# Patient Record
Sex: Female | Born: 1935 | Race: White | Hispanic: No | Marital: Single | Smoking: Former smoker
Health system: Southern US, Community
[De-identification: ages and names within clinical notes are randomized; demographics above are authoritative.]

## PROBLEM LIST (undated history)

## (undated) DIAGNOSIS — C801 Malignant (primary) neoplasm, unspecified: Secondary | ICD-10-CM

## (undated) DIAGNOSIS — L570 Actinic keratosis: Secondary | ICD-10-CM

## (undated) DIAGNOSIS — C50419 Malignant neoplasm of upper-outer quadrant of unspecified female breast: Secondary | ICD-10-CM

## (undated) DIAGNOSIS — S32592A Other specified fracture of left pubis, initial encounter for closed fracture: Secondary | ICD-10-CM

## (undated) DIAGNOSIS — IMO0002 Reserved for concepts with insufficient information to code with codable children: Secondary | ICD-10-CM

## (undated) DIAGNOSIS — I10 Essential (primary) hypertension: Secondary | ICD-10-CM

## (undated) DIAGNOSIS — R350 Frequency of micturition: Secondary | ICD-10-CM

## (undated) DIAGNOSIS — T8859XA Other complications of anesthesia, initial encounter: Secondary | ICD-10-CM

## (undated) DIAGNOSIS — M1990 Unspecified osteoarthritis, unspecified site: Secondary | ICD-10-CM

## (undated) DIAGNOSIS — C50919 Malignant neoplasm of unspecified site of unspecified female breast: Secondary | ICD-10-CM

## (undated) DIAGNOSIS — K5903 Drug induced constipation: Secondary | ICD-10-CM

## (undated) DIAGNOSIS — J3089 Other allergic rhinitis: Secondary | ICD-10-CM

## (undated) DIAGNOSIS — T4145XA Adverse effect of unspecified anesthetic, initial encounter: Secondary | ICD-10-CM

## (undated) HISTORY — DX: Actinic keratosis: L57.0

## (undated) HISTORY — PX: BREAST BIOPSY: SHX20

## (undated) HISTORY — PX: FRACTURE SURGERY: SHX138

## (undated) HISTORY — PX: EYE SURGERY: SHX253

## (undated) HISTORY — DX: Malignant (primary) neoplasm, unspecified: C80.1

## (undated) HISTORY — DX: Malignant neoplasm of upper-outer quadrant of unspecified female breast: C50.419

## (undated) HISTORY — PX: OTHER SURGICAL HISTORY: SHX169

## (undated) HISTORY — PX: BACK SURGERY: SHX140

## (undated) HISTORY — PX: JOINT REPLACEMENT: SHX530

## (undated) HISTORY — PX: BREAST LUMPECTOMY: SHX2

## (undated) HISTORY — DX: Other specified fracture of left pubis, initial encounter for closed fracture: S32.592A

## (undated) HISTORY — PX: BREAST SURGERY: SHX581

## (undated) HISTORY — PX: WRIST SURGERY: SHX841

---

## 1993-11-07 DIAGNOSIS — C50419 Malignant neoplasm of upper-outer quadrant of unspecified female breast: Secondary | ICD-10-CM

## 1993-11-07 HISTORY — DX: Malignant neoplasm of upper-outer quadrant of unspecified female breast: C50.419

## 2000-11-07 HISTORY — PX: BREAST LUMPECTOMY: SHX2

## 2004-09-01 ENCOUNTER — Ambulatory Visit: Payer: Self-pay | Admitting: Unknown Physician Specialty

## 2004-10-15 ENCOUNTER — Ambulatory Visit: Payer: Self-pay | Admitting: Unknown Physician Specialty

## 2004-11-07 HISTORY — PX: COLONOSCOPY: SHX174

## 2005-01-03 ENCOUNTER — Ambulatory Visit: Payer: Self-pay | Admitting: Radiation Oncology

## 2005-06-16 ENCOUNTER — Ambulatory Visit: Payer: Self-pay | Admitting: General Surgery

## 2006-06-28 ENCOUNTER — Ambulatory Visit: Payer: Self-pay | Admitting: General Surgery

## 2006-07-05 ENCOUNTER — Emergency Department: Payer: Self-pay | Admitting: Emergency Medicine

## 2007-07-30 ENCOUNTER — Ambulatory Visit: Payer: Self-pay | Admitting: General Surgery

## 2007-11-23 DIAGNOSIS — C4492 Squamous cell carcinoma of skin, unspecified: Secondary | ICD-10-CM

## 2007-11-23 HISTORY — DX: Squamous cell carcinoma of skin, unspecified: C44.92

## 2008-08-11 ENCOUNTER — Ambulatory Visit: Payer: Self-pay | Admitting: General Surgery

## 2008-12-16 ENCOUNTER — Ambulatory Visit: Payer: Self-pay | Admitting: Urology

## 2009-03-05 ENCOUNTER — Ambulatory Visit: Payer: Self-pay | Admitting: Unknown Physician Specialty

## 2009-03-12 ENCOUNTER — Ambulatory Visit: Payer: Self-pay | Admitting: Unknown Physician Specialty

## 2009-04-13 ENCOUNTER — Emergency Department: Payer: Self-pay | Admitting: Unknown Physician Specialty

## 2009-08-17 ENCOUNTER — Ambulatory Visit: Payer: Self-pay | Admitting: General Surgery

## 2009-11-07 HISTORY — PX: BREAST LUMPECTOMY: SHX2

## 2010-08-19 DIAGNOSIS — Z85828 Personal history of other malignant neoplasm of skin: Secondary | ICD-10-CM

## 2010-08-19 HISTORY — DX: Personal history of other malignant neoplasm of skin: Z85.828

## 2010-09-21 ENCOUNTER — Ambulatory Visit: Payer: Self-pay | Admitting: Unknown Physician Specialty

## 2010-09-28 ENCOUNTER — Ambulatory Visit: Payer: Self-pay | Admitting: Unknown Physician Specialty

## 2010-11-10 ENCOUNTER — Ambulatory Visit: Payer: Self-pay | Admitting: General Surgery

## 2010-11-23 ENCOUNTER — Ambulatory Visit: Payer: Self-pay | Admitting: Anesthesiology

## 2010-11-24 ENCOUNTER — Ambulatory Visit: Payer: Self-pay | Admitting: General Surgery

## 2010-11-27 LAB — PATHOLOGY REPORT

## 2011-04-19 ENCOUNTER — Ambulatory Visit: Payer: Self-pay | Admitting: General Surgery

## 2011-10-20 ENCOUNTER — Ambulatory Visit: Payer: Self-pay | Admitting: General Surgery

## 2012-05-02 ENCOUNTER — Ambulatory Visit: Payer: Self-pay | Admitting: General Surgery

## 2012-07-23 ENCOUNTER — Encounter (HOSPITAL_COMMUNITY): Payer: Self-pay

## 2012-07-23 ENCOUNTER — Other Ambulatory Visit: Payer: Self-pay | Admitting: Orthopedic Surgery

## 2012-07-25 ENCOUNTER — Encounter (HOSPITAL_COMMUNITY): Payer: Self-pay

## 2012-07-25 ENCOUNTER — Encounter (HOSPITAL_COMMUNITY)
Admission: RE | Admit: 2012-07-25 | Discharge: 2012-07-25 | Disposition: A | Discharge status: Home / Self Care | Payer: Medicare Other | Source: Ambulatory Visit | Attending: Orthopedic Surgery | Admitting: Orthopedic Surgery

## 2012-07-25 HISTORY — DX: Unspecified osteoarthritis, unspecified site: M19.90

## 2012-07-25 LAB — PROTIME-INR
INR: 1.01 (ref 0.00–1.49)
Prothrombin Time: 13.2 seconds (ref 11.6–15.2)

## 2012-07-25 LAB — BASIC METABOLIC PANEL
CO2: 26 mEq/L (ref 19–32)
Calcium: 10 mg/dL (ref 8.4–10.5)
GFR calc Af Amer: >90 mL/min (ref >90)
GFR calc non Af Amer: 81 mL/min — ABNORMAL LOW (ref >90)
Sodium: 138 mEq/L (ref 135–145)

## 2012-07-25 LAB — CBC WITH DIFFERENTIAL/PLATELET
Basophils Absolute: 0 10*3/uL (ref 0.0–0.1)
Lymphocytes Relative: 30 % (ref 12–46)
Lymphs Abs: 2.7 10*3/uL (ref 0.7–4.0)
Neutro Abs: 5.5 10*3/uL (ref 1.7–7.7)
Neutrophils Relative %: 59 % (ref 43–77)
Platelets: 252 10*3/uL (ref 150–400)
RBC: 4.37 MIL/uL (ref 3.87–5.11)
RDW: 13 % (ref 11.5–15.5)
WBC: 9.2 10*3/uL (ref 4.0–10.5)

## 2012-07-25 LAB — URINALYSIS, ROUTINE W REFLEX MICROSCOPIC
Bilirubin Urine: NEGATIVE
Hgb urine dipstick: NEGATIVE
Ketones, ur: NEGATIVE mg/dL
Nitrite: NEGATIVE
Urobilinogen, UA: 1 mg/dL (ref 0.0–1.0)
pH: 5.5 (ref 5.0–8.0)

## 2012-07-25 LAB — TYPE AND SCREEN: Antibody Screen: NEGATIVE

## 2012-07-25 LAB — APTT: aPTT: 29 seconds (ref 24–37)

## 2012-07-25 NOTE — Pre-Procedure Instructions (Addendum)
20 Lori Wallace  07/25/2012   Your procedure is scheduled on:  Monday September 23  Report to [facility] at 10:30 AM.  Call this number if you have problems the morning of surgery: [phone]   Remember:   Do not eat or drink:After Midnight.    Take these medicines the morning of surgery with A SIP OF WATER: May take hydrocodone or tramadol pain pill if needed.    Do not wear jewelry, make-up or nail polish.  Do not wear lotions, powders, or perfumes. You may wear deodorant.  Do not shave 48 hours prior to surgery. Men may shave face and neck.  Do not bring valuables to the hospital.  Contacts, dentures or bridgework may not be worn into surgery.  Leave suitcase in the car. After surgery it may be brought to your room.  For patients admitted to the hospital, checkout time is 11:00 AM the day of discharge.   Patients discharged the day of surgery will not be allowed to drive home.  Name and phone number of your driver: NA  Special Instructions: Incentive Spirometry - Practice and bring it with you on the day of surgery. and CHG Shower Use Special Wash: 1/2 bottle night before surgery and 1/2 bottle morning of surgery.   Please read over the following fact sheets that you were given: Pain Booklet, Coughing and Deep Breathing, Blood Transfusion Information, Total Joint Packet and Surgical Site Infection Prevention

## 2012-07-25 NOTE — Progress Notes (Addendum)
Requested EKG from Dr. Silver Huguenin at [facility] in [place].  Requested old CXR from [place]. Will leave chart for anesthesia review of CXR done today.

## 2012-07-26 ENCOUNTER — Encounter (HOSPITAL_COMMUNITY): Payer: Self-pay | Admitting: Vascular Surgery

## 2012-07-26 NOTE — Consult Note (Signed)
Anesthesia Chart Review:  Patient is a 76 year old female scheduled for left THA on 07/30/12 by Dr. Turner Daniels.  History includes former smoker, breast CA s/p bilateral lumpectomies and radiation, arthritis, prior joint, back, and eye surgeries.   CXR from 07/25/12 showed: Mild hyperinflation. Streaky atelectasis lung bases. No acute  infiltrate or pulmonary edema. Surgical clips are noted in the  left axilla. Osteopenia and mild degenerative changes mid thoracic spine.   Labs noted.    EKG from PCP Dr. Evie Lacks office from 01/24/12 showed NSR, possible LAE.  Her EKG was felt stable according to handwritten comments.    Anticipate she can proceed as planned.  Shonna Chock, PA-C

## 2012-07-27 NOTE — H&P (Signed)
  HPI: Patient presents with a chief complaint of left hip pain that began 3 or 4 years ago with no known mechanism of injury.  She was initially diagnosed with trochanteric bursitis and a cortisone shot provided her with approximately 6 weeks of pain relief.  She has also tried Aleve and acupuncture.  She recently saw a chiropractor who wanted her to have updated x-rays before any type of adjustment.  She was seen by Dr. Charlett Blake who ordered x-rays of the hip and told her that she had end-stage arthritis.  She is ambulating with a cane and reports that her pain seems to be progressively worsening.  She localizes most of it to the groin area.  All: Demerol  ROS: 14 point review of systems form filled out by the patient was reviewed and was negative as it relates to the history of present illness except for: Glasses, balance problems, easy bruising  PMH: Breast cancer.  She is status post right total knee arthroplasty and back surgery  FHx: Hypertension and heart disease  SocHx: Denies use of tobacco or alcohol.  She is retired and takes care of her 78 year old mother  PHYSICAL EXAM: Awake, alert, and oriented x3.  Extraocular motion is intact.  No use of accessory respiratory muscles for breathing.   Cardiovascular exam reveals a regular rhythm.  Skin is intact without cuts, scrapes, or abrasions. Well-developed, well-nourished 76 year old female who is alert and oriented and in no acute distress.  She is 5 feet 11 inches tall and weighs 173 pounds.  BMI is 24.  Exam of the left hip demonstrates significant pain with any attempt at internal rotation.  Strength in the lower extremities is intact and symmetric.  Imaging/Tests: X-rays of the left hip taken by Dr. Charlett Blake were reviewed in our office today and demonstrate end-stage arthritis in the superior weightbearing dome of the left hip with collapse of the femoral head  Asses: End-stage arthritis of the left hip  Plan: We have discussed hip  replacement surgery in detail with [name] today.  She understands all risks and benefits and would like to proceed.  She will talk to Agustin Cree about scheduling.

## 2012-07-29 MED ORDER — DEXTROSE-NACL 5-0.45 % IV SOLN: INTRAVENOUS | Status: DC

## 2012-07-29 MED ORDER — CHLORHEXIDINE GLUCONATE 4 % EX LIQD: 60.0000 mL | Freq: Once | CUTANEOUS | Status: DC

## 2012-07-29 MED ORDER — CEFAZOLIN SODIUM-DEXTROSE 2-3 GM-% IV SOLR
2.0000 g | INTRAVENOUS | Status: AC
  Administered 2012-07-30: 2 g via INTRAVENOUS
  Filled 2012-07-29: qty 50

## 2012-07-30 ENCOUNTER — Encounter (HOSPITAL_COMMUNITY): Payer: Self-pay | Admitting: Vascular Surgery

## 2012-07-30 ENCOUNTER — Encounter (HOSPITAL_COMMUNITY): Payer: Self-pay | Admitting: *Deleted

## 2012-07-30 ENCOUNTER — Inpatient Hospital Stay (HOSPITAL_COMMUNITY): Payer: Medicare Other

## 2012-07-30 ENCOUNTER — Encounter (HOSPITAL_COMMUNITY)
Admission: RE | Disposition: A | Discharge status: Home Health | Payer: Self-pay | Source: Ambulatory Visit | Attending: Orthopedic Surgery

## 2012-07-30 ENCOUNTER — Inpatient Hospital Stay (HOSPITAL_COMMUNITY)
Admission: RE | Admit: 2012-07-30 | Discharge: 2012-08-01 | DRG: 470 | Disposition: A | Discharge status: Home Health | Payer: Medicare Other | Source: Ambulatory Visit | Attending: Orthopedic Surgery | Admitting: Orthopedic Surgery

## 2012-07-30 ENCOUNTER — Inpatient Hospital Stay (HOSPITAL_COMMUNITY): Payer: Medicare Other | Admitting: Vascular Surgery

## 2012-07-30 DIAGNOSIS — Z96659 Presence of unspecified artificial knee joint: Secondary | ICD-10-CM

## 2012-07-30 DIAGNOSIS — Z7982 Long term (current) use of aspirin: Secondary | ICD-10-CM

## 2012-07-30 DIAGNOSIS — Z853 Personal history of malignant neoplasm of breast: Secondary | ICD-10-CM

## 2012-07-30 DIAGNOSIS — Z79899 Other long term (current) drug therapy: Secondary | ICD-10-CM

## 2012-07-30 DIAGNOSIS — D62 Acute posthemorrhagic anemia: Secondary | ICD-10-CM | POA: Diagnosis not present

## 2012-07-30 DIAGNOSIS — Z01812 Encounter for preprocedural laboratory examination: Secondary | ICD-10-CM

## 2012-07-30 DIAGNOSIS — M1612 Unilateral primary osteoarthritis, left hip: Secondary | ICD-10-CM | POA: Diagnosis present

## 2012-07-30 DIAGNOSIS — M1610 Unilateral primary osteoarthritis, unspecified hip: Principal | ICD-10-CM | POA: Diagnosis present

## 2012-07-30 DIAGNOSIS — M169 Osteoarthritis of hip, unspecified: Principal | ICD-10-CM | POA: Diagnosis present

## 2012-07-30 DIAGNOSIS — Z923 Personal history of irradiation: Secondary | ICD-10-CM

## 2012-07-30 DIAGNOSIS — Z8249 Family history of ischemic heart disease and other diseases of the circulatory system: Secondary | ICD-10-CM

## 2012-07-30 HISTORY — PX: TOTAL HIP ARTHROPLASTY: SHX124

## 2012-07-30 SURGERY — ARTHROPLASTY, HIP, TOTAL,POSTERIOR APPROACH
Anesthesia: General | Site: Hip | Laterality: Left | Wound class: Clean

## 2012-07-30 MED ORDER — HYDROMORPHONE HCL PF 1 MG/ML IJ SOLN
INTRAMUSCULAR | Status: AC
  Filled 2012-07-30: qty 1

## 2012-07-30 MED ORDER — MORPHINE SULFATE 10 MG/ML IJ SOLN
INTRAMUSCULAR | Status: DC | PRN
  Administered 2012-07-30 (×2): 5 mg via INTRAVENOUS

## 2012-07-30 MED ORDER — KETOROLAC TROMETHAMINE 15 MG/ML IJ SOLN
7.5000 mg | Freq: Four times a day (QID) | INTRAMUSCULAR | Status: AC
  Administered 2012-07-30: 7.5 mg via INTRAVENOUS
  Filled 2012-07-30 (×4): qty 1

## 2012-07-30 MED ORDER — LACTATED RINGERS IV SOLN
INTRAVENOUS | Status: DC
  Administered 2012-07-30 (×3): via INTRAVENOUS

## 2012-07-30 MED ORDER — ASPIRIN EC 325 MG PO TBEC
325.0000 mg | DELAYED_RELEASE_TABLET | Freq: Two times a day (BID) | ORAL | Status: DC
  Administered 2012-07-30 – 2012-08-01 (×4): 325 mg via ORAL
  Filled 2012-07-30 (×5): qty 1

## 2012-07-30 MED ORDER — OXYCODONE HCL 5 MG PO TABS
5.0000 mg | ORAL_TABLET | ORAL | Status: DC | PRN
  Administered 2012-07-31 – 2012-08-01 (×3): 5 mg via ORAL
  Filled 2012-07-30: qty 1
  Filled 2012-07-30: qty 2
  Filled 2012-07-30: qty 1

## 2012-07-30 MED ORDER — PHENYLEPHRINE HCL 10 MG/ML IJ SOLN
INTRAMUSCULAR | Status: DC | PRN
  Administered 2012-07-30: 40 ug via INTRAVENOUS
  Administered 2012-07-30: 80 ug via INTRAVENOUS
  Administered 2012-07-30: 40 ug via INTRAVENOUS

## 2012-07-30 MED ORDER — METOCLOPRAMIDE HCL 5 MG PO TABS
5.0000 mg | ORAL_TABLET | Freq: Three times a day (TID) | ORAL | Status: DC | PRN
  Filled 2012-07-30: qty 2

## 2012-07-30 MED ORDER — MIDAZOLAM HCL 5 MG/5ML IJ SOLN
INTRAMUSCULAR | Status: DC | PRN
  Administered 2012-07-30: 1 mg via INTRAVENOUS

## 2012-07-30 MED ORDER — PHENOL 1.4 % MT LIQD: 1.0000 | OROMUCOSAL | Status: DC | PRN

## 2012-07-30 MED ORDER — HYDROMORPHONE HCL PF 1 MG/ML IJ SOLN: 1.0000 mg | INTRAMUSCULAR | Status: DC | PRN

## 2012-07-30 MED ORDER — CELECOXIB 200 MG PO CAPS
200.0000 mg | ORAL_CAPSULE | Freq: Two times a day (BID) | ORAL | Status: DC
  Administered 2012-07-30 – 2012-08-01 (×4): 200 mg via ORAL
  Filled 2012-07-30 (×6): qty 1

## 2012-07-30 MED ORDER — ONDANSETRON HCL 4 MG/2ML IJ SOLN
INTRAMUSCULAR | Status: DC | PRN
  Administered 2012-07-30: 4 mg via INTRAVENOUS

## 2012-07-30 MED ORDER — FENTANYL CITRATE 0.05 MG/ML IJ SOLN
INTRAMUSCULAR | Status: DC | PRN
  Administered 2012-07-30 (×2): 100 ug via INTRAVENOUS
  Administered 2012-07-30 (×2): 50 ug via INTRAVENOUS

## 2012-07-30 MED ORDER — KETOROLAC TROMETHAMINE 30 MG/ML IJ SOLN
INTRAMUSCULAR | Status: AC
  Administered 2012-07-30: 7.5 mg
  Filled 2012-07-30: qty 1

## 2012-07-30 MED ORDER — BUPIVACAINE-EPINEPHRINE PF 0.25-1:200000 % IJ SOLN
INTRAMUSCULAR | Status: DC | PRN
  Administered 2012-07-30: 22 mL

## 2012-07-30 MED ORDER — ONDANSETRON HCL 4 MG PO TABS: 4.0000 mg | ORAL_TABLET | Freq: Four times a day (QID) | ORAL | Status: DC | PRN

## 2012-07-30 MED ORDER — ONDANSETRON HCL 4 MG/2ML IJ SOLN: 4.0000 mg | Freq: Once | INTRAMUSCULAR | Status: DC | PRN

## 2012-07-30 MED ORDER — SODIUM CHLORIDE 0.9 % IR SOLN
Status: DC | PRN
  Administered 2012-07-30: 1000 mL

## 2012-07-30 MED ORDER — MAGNESIUM HYDROXIDE 400 MG/5ML PO SUSP: 30.0000 mL | Freq: Every day | ORAL | Status: DC | PRN

## 2012-07-30 MED ORDER — OXYCODONE HCL 5 MG/5ML PO SOLN: 5.0000 mg | Freq: Once | ORAL | Status: DC | PRN

## 2012-07-30 MED ORDER — PROPOFOL 10 MG/ML IV EMUL
INTRAVENOUS | Status: DC | PRN
  Administered 2012-07-30: 150 mg via INTRAVENOUS
  Administered 2012-07-30: 10 mg via INTRAVENOUS

## 2012-07-30 MED ORDER — ACETAMINOPHEN 650 MG RE SUPP: 650.0000 mg | Freq: Four times a day (QID) | RECTAL | Status: DC | PRN

## 2012-07-30 MED ORDER — GLYCOPYRROLATE 0.2 MG/ML IJ SOLN
INTRAMUSCULAR | Status: DC | PRN
  Administered 2012-07-30: 0.6 mg via INTRAVENOUS
  Administered 2012-07-30: 0.1 mg via INTRAVENOUS

## 2012-07-30 MED ORDER — ONDANSETRON HCL 4 MG/2ML IJ SOLN
INTRAMUSCULAR | Status: AC
  Filled 2012-07-30: qty 2

## 2012-07-30 MED ORDER — KCL IN DEXTROSE-NACL 20-5-0.45 MEQ/L-%-% IV SOLN
INTRAVENOUS | Status: DC
  Administered 2012-07-31: 03:00:00 via INTRAVENOUS
  Filled 2012-07-30 (×9): qty 1000

## 2012-07-30 MED ORDER — FLEET ENEMA 7-19 GM/118ML RE ENEM: 1.0000 | ENEMA | Freq: Once | RECTAL | Status: AC | PRN

## 2012-07-30 MED ORDER — ALUM & MAG HYDROXIDE-SIMETH 200-200-20 MG/5ML PO SUSP: 30.0000 mL | ORAL | Status: DC | PRN

## 2012-07-30 MED ORDER — NEOSTIGMINE METHYLSULFATE 1 MG/ML IJ SOLN
INTRAMUSCULAR | Status: DC | PRN
  Administered 2012-07-30: 4 mg via INTRAVENOUS

## 2012-07-30 MED ORDER — ONDANSETRON HCL 4 MG/2ML IJ SOLN
4.0000 mg | Freq: Four times a day (QID) | INTRAMUSCULAR | Status: DC | PRN
  Administered 2012-07-30: 4 mg via INTRAVENOUS

## 2012-07-30 MED ORDER — ROCURONIUM BROMIDE 100 MG/10ML IV SOLN
INTRAVENOUS | Status: DC | PRN
  Administered 2012-07-30: 50 mg via INTRAVENOUS

## 2012-07-30 MED ORDER — ACETAMINOPHEN 325 MG PO TABS: 650.0000 mg | ORAL_TABLET | Freq: Four times a day (QID) | ORAL | Status: DC | PRN

## 2012-07-30 MED ORDER — BUPIVACAINE-EPINEPHRINE PF 0.25-1:200000 % IJ SOLN
INTRAMUSCULAR | Status: AC
  Filled 2012-07-30: qty 30

## 2012-07-30 MED ORDER — DIPHENHYDRAMINE HCL 12.5 MG/5ML PO ELIX: 12.5000 mg | ORAL_SOLUTION | ORAL | Status: DC | PRN

## 2012-07-30 MED ORDER — LIDOCAINE HCL (CARDIAC) 20 MG/ML IV SOLN
INTRAVENOUS | Status: DC | PRN
  Administered 2012-07-30: 50 mg via INTRAVENOUS

## 2012-07-30 MED ORDER — OXYCODONE HCL 5 MG PO TABS: 5.0000 mg | ORAL_TABLET | Freq: Once | ORAL | Status: DC | PRN

## 2012-07-30 MED ORDER — METOCLOPRAMIDE HCL 5 MG/ML IJ SOLN
5.0000 mg | Freq: Three times a day (TID) | INTRAMUSCULAR | Status: DC | PRN
  Administered 2012-07-30: 10 mg via INTRAVENOUS
  Filled 2012-07-30: qty 2

## 2012-07-30 MED ORDER — METHOCARBAMOL 500 MG PO TABS
500.0000 mg | ORAL_TABLET | Freq: Four times a day (QID) | ORAL | Status: DC | PRN
  Administered 2012-07-31: 500 mg via ORAL
  Filled 2012-07-30: qty 1

## 2012-07-30 MED ORDER — METHOCARBAMOL 100 MG/ML IJ SOLN
500.0000 mg | Freq: Four times a day (QID) | INTRAVENOUS | Status: DC | PRN
  Filled 2012-07-30: qty 5

## 2012-07-30 MED ORDER — FENTANYL CITRATE 0.05 MG/ML IJ SOLN
INTRAMUSCULAR | Status: AC
  Filled 2012-07-30: qty 2

## 2012-07-30 MED ORDER — BISACODYL 5 MG PO TBEC: 5.0000 mg | DELAYED_RELEASE_TABLET | Freq: Every day | ORAL | Status: DC | PRN

## 2012-07-30 MED ORDER — VITAMIN B-1 50 MG PO TABS
50.0000 mg | ORAL_TABLET | ORAL | Status: DC
  Administered 2012-08-01: 50 mg via ORAL
  Filled 2012-07-30 (×3): qty 1

## 2012-07-30 MED ORDER — HYDROMORPHONE HCL PF 1 MG/ML IJ SOLN
0.2500 mg | INTRAMUSCULAR | Status: DC | PRN
  Administered 2012-07-30: 0.5 mg via INTRAVENOUS

## 2012-07-30 MED ORDER — MENTHOL 3 MG MT LOZG: 1.0000 | LOZENGE | OROMUCOSAL | Status: DC | PRN

## 2012-07-30 SURGICAL SUPPLY — 53 items
BLADE SAW SAG 73X25 THK (BLADE) ×1
BLADE SAW SGTL 18X1.27X75 (BLADE) IMPLANT
BLADE SAW SGTL 73X25 THK (BLADE) ×1 IMPLANT
BLADE SAW SGTL MED 73X18.5 STR (BLADE) IMPLANT
BRUSH FEMORAL CANAL (MISCELLANEOUS) IMPLANT
CLOTH BEACON ORANGE TIMEOUT ST (SAFETY) ×2 IMPLANT
COVER BACK TABLE 24X17X13 BIG (DRAPES) IMPLANT
COVER SURGICAL LIGHT HANDLE (MISCELLANEOUS) ×4 IMPLANT
DRAPE ORTHO SPLIT 77X108 STRL (DRAPES) ×1
DRAPE PROXIMA HALF (DRAPES) ×2 IMPLANT
DRAPE SURG ORHT 6 SPLT 77X108 (DRAPES) ×1 IMPLANT
DRAPE U-SHAPE 47X51 STRL (DRAPES) ×2 IMPLANT
DRILL BIT 7/64X5 (BIT) ×2 IMPLANT
DRSG MEPILEX BORDER 4X12 (GAUZE/BANDAGES/DRESSINGS) ×2 IMPLANT
DRSG MEPILEX BORDER 4X8 (GAUZE/BANDAGES/DRESSINGS) ×2 IMPLANT
DURAPREP 26ML APPLICATOR (WOUND CARE) ×2 IMPLANT
ELECT BLADE 4.0 EZ CLEAN MEGAD (MISCELLANEOUS)
ELECT REM PT RETURN 9FT ADLT (ELECTROSURGICAL) ×2
ELECTRODE BLDE 4.0 EZ CLN MEGD (MISCELLANEOUS) IMPLANT
ELECTRODE REM PT RTRN 9FT ADLT (ELECTROSURGICAL) ×1 IMPLANT
GAUZE XEROFORM 1X8 LF (GAUZE/BANDAGES/DRESSINGS) ×2 IMPLANT
GLOVE BIO SURGEON STRL SZ7 (GLOVE) ×2 IMPLANT
GLOVE BIO SURGEON STRL SZ7.5 (GLOVE) ×2 IMPLANT
GLOVE BIOGEL PI IND STRL 7.0 (GLOVE) ×1 IMPLANT
GLOVE BIOGEL PI IND STRL 8 (GLOVE) ×1 IMPLANT
GLOVE BIOGEL PI INDICATOR 7.0 (GLOVE) ×1
GLOVE BIOGEL PI INDICATOR 8 (GLOVE) ×1
GOWN PREVENTION PLUS XLARGE (GOWN DISPOSABLE) ×2 IMPLANT
GOWN STRL NON-REIN LRG LVL3 (GOWN DISPOSABLE) ×4 IMPLANT
HANDPIECE INTERPULSE COAX TIP (DISPOSABLE)
HOOD PEEL AWAY FACE SHEILD DIS (HOOD) ×4 IMPLANT
KIT BASIN OR (CUSTOM PROCEDURE TRAY) ×2 IMPLANT
KIT ROOM TURNOVER OR (KITS) ×2 IMPLANT
MANIFOLD NEPTUNE II (INSTRUMENTS) ×2 IMPLANT
NEEDLE 22X1 1/2 (OR ONLY) (NEEDLE) ×2 IMPLANT
NS IRRIG 1000ML POUR BTL (IV SOLUTION) ×2 IMPLANT
PACK TOTAL JOINT (CUSTOM PROCEDURE TRAY) ×2 IMPLANT
PAD ARMBOARD 7.5X6 YLW CONV (MISCELLANEOUS) ×4 IMPLANT
PASSER SUT SWANSON 36MM LOOP (INSTRUMENTS) ×2 IMPLANT
PRESSURIZER FEMORAL UNIV (MISCELLANEOUS) IMPLANT
SET HNDPC FAN SPRY TIP SCT (DISPOSABLE) IMPLANT
SUT ETHIBOND 2 V 37 (SUTURE) ×2 IMPLANT
SUT ETHILON 3 0 FSL (SUTURE) ×2 IMPLANT
SUT VIC AB 0 CTB1 27 (SUTURE) ×2 IMPLANT
SUT VIC AB 1 CTX 36 (SUTURE) ×1
SUT VIC AB 1 CTX36XBRD ANBCTR (SUTURE) ×1 IMPLANT
SUT VIC AB 2-0 CTB1 (SUTURE) ×2 IMPLANT
SYR CONTROL 10ML LL (SYRINGE) ×2 IMPLANT
TOWEL OR 17X24 6PK STRL BLUE (TOWEL DISPOSABLE) ×2 IMPLANT
TOWEL OR 17X26 10 PK STRL BLUE (TOWEL DISPOSABLE) ×2 IMPLANT
TOWER CARTRIDGE SMART MIX (DISPOSABLE) IMPLANT
TRAY FOLEY CATH 14FR (SET/KITS/TRAYS/PACK) IMPLANT
WATER STERILE IRR 1000ML POUR (IV SOLUTION) ×8 IMPLANT

## 2012-07-30 NOTE — Anesthesia Postprocedure Evaluation (Signed)
  Anesthesia Post-op Note  Patient: [name]  Procedure(s) Performed: Procedure(s) (LRB) with comments: TOTAL HIP ARTHROPLASTY (Left)  Patient Location: PACU  Anesthesia Type: General  Level of Consciousness: sedated, patient cooperative and responds to stimulation and voice  Airway and Oxygen Therapy: Patient Spontanous Breathing and Patient connected to nasal cannula oxygen  Post-op Pain: none  Post-op Assessment: Post-op Vital signs reviewed, Patient's Cardiovascular Status Stable, Respiratory Function Stable, Patent Airway, No signs of Nausea or vomiting and Pain level controlled  Post-op Vital Signs: Reviewed and stable  Complications: No apparent anesthesia complications

## 2012-07-30 NOTE — Transfer of Care (Signed)
Immediate Anesthesia Transfer of Care Note  Patient: Lori Wallace  Procedure(s) Performed: Procedure(s) (LRB) with comments: TOTAL HIP ARTHROPLASTY (Left)  Patient Location: PACU  Anesthesia Type: General  Level of Consciousness: awake, oriented and patient cooperative  Airway & Oxygen Therapy: Patient Spontanous Breathing and Patient connected to nasal cannula oxygen  Post-op Assessment: Report given to PACU RN and Post -op Vital signs reviewed and stable  Post vital signs: Reviewed and stable  Complications: No apparent anesthesia complications

## 2012-07-30 NOTE — Plan of Care (Signed)
Problem: Consults Goal: Diagnosis- Total Joint Replacement Primary Total Hip     

## 2012-07-30 NOTE — Interval H&P Note (Signed)
History and Physical Interval Note:  07/30/2012 11:33 AM  [name]  has presented today for surgery, with the diagnosis of DEGENERATIVE JOINT DISEASE  The various methods of treatment have been discussed with the patient and family. After consideration of risks, benefits and other options for treatment, the patient has consented to  Procedure(s) (LRB) with comments: TOTAL HIP ARTHROPLASTY (Left) as a surgical intervention .  The patient's history has been reviewed, patient examined, no change in status, stable for surgery.  I have reviewed the patient's chart and labs.  Questions were answered to the patient's satisfaction.     Lori Wallace

## 2012-07-30 NOTE — Op Note (Signed)
OPERATIVE REPORT    DATE OF PROCEDURE:  07/30/2012       PREOPERATIVE DIAGNOSIS:  DEGENERATIVE JOINT DISEASE                                                          POSTOPERATIVE DIAGNOSIS:  DEGENERATIVE JOINT DISEASE                                                           PROCEDURE:  L total hip arthroplasty using a 54 mm DePuy Pinnacle  Cup, [employer], 10-degree polyethylene liner index superior  and posterior, a +0 36 mm ceramic head, a 22x17x42x170 SROM stem, 22Dsm Cone   SURGEON: Andriana Casa J    ASSISTANT:   Mauricia Area, PA-C  (present throughout entire procedure and necessary for timely completion of the procedure)   ANESTHESIA: General BLOOD LOSS: 500 FLUID REPLACEMENT: 1800 crystalloid DRAINS: Foley Catheter URINE OUTPUT: 300cc COMPLICATIONS: none    INDICATIONS FOR PROCEDURE: A 76 y.o. year-old With  DEGENERATIVE JOINT DISEASE   for 3 years, x-rays show bone-on-bone arthritic changes, sub chondral cysts. Despite conservative measures with observation, anti-inflammatory medicine, narcotics, use of a cane, has severe unremitting pain and can ambulate only a few blocks before resting.  Patient desires elective L total hip arthroplasty to decrease pain and increase function. The risks, benefits, and alternatives were discussed at length including but not limited to the risks of infection, bleeding, nerve injury, stiffness, blood clots, the need for revision surgery, cardiopulmonary complications, among others, and they were willing to proceed.y have been discussed. Questions answered.     PROCEDURE IN DETAIL: The patient was identified by armband,  received preoperative IV antibiotics in the holding area at [facility], taken to the operating room , appropriate anesthetic monitors  were attached and general endotracheal anesthesia induced. Foley catheter was inserted. Pt was rolled into the R lateral decubitus position and fixed there with a Stulberg  Mark II pelvic clamp and the L lower extremity was then prepped and draped  in the usual sterile fashion from the ankle to the hemipelvis. A time-out  procedure was performed. The skin along the lateral hip and thigh  infiltrated with 10 mL of 0.5% Marcaine and epinephrine solution. We  then made a posterolateral approach to the hip. With a #10 blade, 18 cm  incision through skin and subcutaneous tissue down to the level of the  IT band. Small bleeders were identified and cauterized. IT band cut in  line with skin incision exposing the greater trochanter. A Cobra retractor was placed between the gluteus minimus and the superior hip joint capsule, and a spiked Cobra between the quadratus femoris and the inferior hip joint capsule. This isolated the short  external rotators and piriformis tendons. These were tagged with a #2 Ethibond  suture and cut off their insertion on the intertrochanteric crest. The posterior  capsule was then developed into an acetabular-based flap from Posterior Superior off of the acetabulum out over the femoral neck and back posterior inferior to the acetabular rim. This flap was tagged with two #2 Ethibond sutures  and retracted protecting the sciatic nerve. This exposed the arthritic femoral head and osteophytes. The hip was then flexed and internally rotated, dislocating the femoral head and a standard neck cut performed 1 fingerbreadth above the lesser trochanter.  A spiked Cobra was placed in the cotyloid notch and a Hohmann retractor was then used to lever the femur anteriorly off of the anterior pelvic column. A posterior-inferior wing retractor was placed at the junction of the acetabulum and the ischium completing the acetabular exposure.We then removed the peripheral osteophytes and labrum from the acetabulum. We then reamed the acetabulum up to 53 mm with basket reamers obtaining good coverage in all quadrants, irrigated out with normal  saline solution and hammered into  place a 54 mm pinnacle cup in 45  degrees of abduction and about 20 degrees of anteversion. More  peripheral osteophytes removed and a trial 10-degree liner placed with the  index superior-posterior. The hip was then flexed and internally rotated exposing the  proximal femur, which was entered with the initiating reamer followed by  the axial reamers up to a 17.5 mm full depth and 18mm partial depth. We then conically reamed to 22D to the correct depth for a 42 base neck. The calcar was milled to 22Dsm. A trial cone and stem was inserted in the 25 degrees anteversion, with a +0 36mm trial head. Trial reduction was then performed and excellent stability was noted with at 90 of flexion with 70 of internal rotation and then full extension with maximal external rotation. The hip could not be dislocated in full extension. The knee could easily flex  to about 130 degrees. We also stretched the abductors at this point,  because of the preexisting adductor contractures. All trial components  were then removed. The acetabulum was irrigated out with normal saline  solution. A titanium Apex [facility] was then screwed into place  followed by a 10-degree polyethylene liner index superior-posterior. On  the femoral side a 22Dsm ZTT1 cone was hammered into place, followed by a 22x17x170x42 SROM stem in 25 degrees of anteversion. At this point, a +0 36 mm ceramic head was  hammered on the stem. The hip was reduced. We checked our stability  one more time and found to be excellent. The wound was once again  thoroughly irrigated out with normal saline solution pulse lavage. The  capsular flap and short external rotators were repaired back to the  intertrochanteric crest through drill holes with a #2 Ethibond suture.  The IT band was closed with running 1 Vicryl suture. The subcutaneous  tissue with 0 and 2-0 undyed Vicryl suture and the skin with running  interlocking 3-0 nylon suture. Dressing of Xeroform  and Mepilex was  then applied. The patient was then unclamped, rolled supine, awaken extubated and taken to recovery room without difficulty in stable condition.   Gean Birchwood J 07/30/2012, 1:36 PM

## 2012-07-30 NOTE — Progress Notes (Signed)
Orthopedic Tech Progress Note Patient Details:  Lori Wallace 03-05-1936 [id number]  Patient ID: [name], female   DOB: 1935-12-07, 76 y.o.   MRN: [id number] Trapeze bar patient helper  [name] 07/30/2012, 8:51 PM

## 2012-07-30 NOTE — Progress Notes (Signed)
Orthopedic Tech Progress Note Patient Details:  Lori Wallace [birth year] [id number]  Patient ID: Lori Wallace, female   DOB: March 02, 1936, 76 y.o.   MRN: [id number] Viewed order from doctor's order list  [name] 07/30/2012, 8:52 PM

## 2012-07-30 NOTE — Anesthesia Preprocedure Evaluation (Signed)
Anesthesia Evaluation  Patient identified by MRN, date of birth, ID band Patient awake    Reviewed: Allergy & Precautions, H&P , NPO status , Patient's Chart, lab work & pertinent test results  Airway Mallampati: I TM Distance: >3 FB     Dental  (+) Teeth Intact and Dental Advisory Given   Pulmonary  breath sounds clear to auscultation        Cardiovascular Rhythm:Regular Rate:Normal     Neuro/Psych    GI/Hepatic   Endo/Other    Renal/GU      Musculoskeletal   Abdominal   Peds  Hematology   Anesthesia Other Findings   Reproductive/Obstetrics                           Anesthesia Physical Anesthesia Plan  ASA: I  Anesthesia Plan: General   Post-op Pain Management:    Induction: Intravenous  Airway Management Planned: Oral ETT  Additional Equipment:   Intra-op Plan:   Post-operative Plan: Extubation in OR  Informed Consent: I have reviewed the patients History and Physical, chart, labs and discussed the procedure including the risks, benefits and alternatives for the proposed anesthesia with the patient or authorized representative who has indicated his/her understanding and acceptance.   Dental advisory given  Plan Discussed with: CRNA, Anesthesiologist and Surgeon  Anesthesia Plan Comments:         Anesthesia Quick Evaluation  

## 2012-07-31 ENCOUNTER — Encounter (HOSPITAL_COMMUNITY): Payer: Self-pay | Admitting: Orthopedic Surgery

## 2012-07-31 LAB — CBC
HCT: 24.5 % — ABNORMAL LOW (ref 36.0–46.0)
Hemoglobin: 8.1 g/dL — ABNORMAL LOW (ref 12.0–15.0)
MCH: 31.3 pg (ref 26.0–34.0)
MCHC: 33.1 g/dL (ref 30.0–36.0)
RDW: 13.2 % (ref 11.5–15.5)

## 2012-07-31 LAB — BASIC METABOLIC PANEL
BUN: 17 mg/dL (ref 6–23)
Calcium: 8 mg/dL — ABNORMAL LOW (ref 8.4–10.5)
GFR calc Af Amer: >90 mL/min (ref >90)
GFR calc non Af Amer: 83 mL/min — ABNORMAL LOW (ref >90)
Glucose, Bld: 223 mg/dL — ABNORMAL HIGH (ref 70–99)
Potassium: 4.4 mEq/L (ref 3.5–5.1)
Sodium: 136 mEq/L (ref 135–145)

## 2012-07-31 NOTE — Progress Notes (Signed)
Patient ID: Lori Wallace, female   DOB: 1936/05/30, 76 y.o.   MRN: [id number] PATIENT ID: Lori Wallace  MRN: [id number]  DOB/AGE:  September 26, 1936 / 76 y.o.  1 Day Post-Op Procedure(s) (LRB): TOTAL HIP ARTHROPLASTY (Left)    PROGRESS NOTE Subjective: Patient is alert, oriented,no Nausea, no Vomiting, yes passing gas, no Bowel Movement. Taking PO well. Denies SOB, Chest or Calf Pain. Using Incentive Spirometer, PAS in place. Ambulate wbat today Patient reports pain as 2 on 0-10 scale  .    Objective: Vital signs in last 24 hours: Filed Vitals:   07/30/12 1745 07/30/12 2252 07/31/12 0242 07/31/12 0648  BP: 174/69 140/62 116/52 107/46  Pulse:  55 55 62  Temp: 97.4 F (36.3 C) 98 F (36.7 C) 97.7 F (36.5 C) 97.7 F (36.5 C)  TempSrc:      Resp:  16 16 16   SpO2:  100% 99% 100%      Intake/Output from previous day: I/O last 3 completed shifts: In: 2940 [P.O.:240; I.V.:2500; Other:200] Out: 650 [Urine:650]   Intake/Output this shift:     LABORATORY DATA:  Basename 07/31/12 0535  WBC 10.0  HGB 8.1*  HCT 24.5*  PLT 162  NA 136  K 4.4  CL 103  CO2 27  BUN 17  CREATININE 0.71  GLUCOSE 223*  GLUCAP --  INR --  CALCIUM 8.0*    Examination: Neurologically intact ABD soft Neurovascular intact Sensation intact distally Intact pulses distally Dorsiflexion/Plantar flexion intact Incision: scant drainage No cellulitis present Compartment soft} XR AP&Lat of hip shows well placed\fixed THA  Assessment:   1 Day Post-Op Procedure(s) (LRB): TOTAL HIP ARTHROPLASTY (Left) ADDITIONAL DIAGNOSIS:  Acute blood loss anemia.  Plan: PT/OT WBAT, THA  posterior precautions, patient denies any dizziness or shortness of breath, we will monitor her hemoglobin and transfuse if she becomes symptomatic.  DVT Prophylaxis: SCDx72 hrs, ASA 325 mg BID x 2 weeks  DISCHARGE PLAN: Home, patient wants to go home but understands that if therapy goes slow SNF will be needed  DISCHARGE NEEDS: HHPT,  HHRN, CPM, Walker, 3-in-1 comode seat and IV Antibiotics

## 2012-07-31 NOTE — Clinical Social Work Note (Signed)
CSW received consult order as part of hip replacement order-set. CSW reviewed chart, PT/OT notes, and discuss Pt in multidisciplinary meeting. Pt plans to dc home with HH, CM following.  No obvious CSW needs, CSW signing off.   Frederico Hamman, LCSW [phone] Covering Ortho CSW Lovette Cliche, [place]

## 2012-07-31 NOTE — Evaluation (Signed)
Physical Therapy Evaluation Patient Details Name: Lori Wallace MRN: [id number] DOB: [birth year] Today's Date: 07/31/2012 Time: 0812-0829 PT Time Calculation (min): 17 min  PT Assessment / Plan / Recommendation Clinical Impression  Pt presents s/p L THA (posterior) POD 1 with decreased strength, ROM and mobility.  Tolerated OOB and ambulation in hallway well with min/guard assist for safety with no c/o dizziness/lightheadedness while being upright.  Pt will benefit from skilled PT in acute venue to address deficits.  PT recommends HHPT for follow up at home.  Pt normally takes care of mother, however her sister will be staying with her for two weeks following surgery.     PT Assessment  Patient needs continued PT services    Follow Up Recommendations  Home health PT    Barriers to Discharge None      Equipment Recommendations  Rolling walker with 5" wheels;3 in 1 bedside comode    Recommendations for Other Services OT consult   Frequency 7X/week    Precautions / Restrictions Precautions Precautions: Posterior Hip Precaution Booklet Issued: Yes (comment) Precaution Comments: Provided handout and educated Restrictions Weight Bearing Restrictions: No Other Position/Activity Restrictions: WBAT   Pertinent Vitals/Pain 1/10, not "pain", per pt only "soreness"       Mobility  Bed Mobility Bed Mobility: Supine to Sit;Sitting - Scoot to Edge of Bed Supine to Sit: 4: Min guard;HOB elevated Sitting - Scoot to [employer] of Bed: 5: Supervision Details for Bed Mobility Assistance: Min/guard to ensure safety of LLE out of bed with cues for hand placement and RLE placement to self assist to EOB.  Transfers Transfers: Sit to Stand;Stand to Sit Sit to Stand: 4: Min guard;From elevated surface;With upper extremity assist;From bed Stand to Sit: 4: Min guard;With upper extremity assist;With armrests;To chair/3-in-1 Details for Transfer Assistance: Min/guard to ensure safety and controlled descent  with cues for hand placement and LE management in order to maintain THP.  Ambulation/Gait Ambulation/Gait Assistance: 4: Min guard Ambulation Distance (Feet): 90 Feet Assistive device: Rolling walker Ambulation/Gait Assistance Details: Cues for sequencing/technique with RW and to maintain upright posture throughout ambulation.  Pt with slight tendency to step too far inside of RW, however able to correct with cuing.  Gait Pattern: Step-to pattern;Decreased stance time - left;Decreased step length - right;Trunk flexed Gait velocity: decreased Stairs: No Wheelchair Mobility Wheelchair Mobility: No    Exercises     PT Diagnosis: Difficulty walking;Generalized weakness;Acute pain  PT Problem List: Decreased strength;Decreased range of motion;Decreased activity tolerance;Decreased balance;Decreased mobility;Decreased knowledge of use of DME;Decreased knowledge of precautions;Pain PT Treatment Interventions: DME instruction;Gait training;Stair training;Functional mobility training;Therapeutic activities;Therapeutic exercise;Balance training;Patient/family education   PT Goals Acute Rehab PT Goals PT Goal Formulation: With patient Time For Goal Achievement: 08/03/12 Potential to Achieve Goals: Good Pt will go Sit to Supine/Side: with supervision PT Goal: Sit to Supine/Side - Progress: Goal set today Pt will go Sit to Stand: with modified independence PT Goal: Sit to Stand - Progress: Goal set today Pt will Ambulate: 51 - 150 feet;with modified independence;with least restrictive assistive device PT Goal: Ambulate - Progress: Goal set today Pt will Go Up / Down Stairs: 1-2 stairs;with supervision;with least restrictive assistive device PT Goal: Up/Down Stairs - Progress: Goal set today Pt will Perform Home Exercise Program: with supervision, verbal cues required/provided PT Goal: Perform Home Exercise Program - Progress: Goal set today  Visit Information  Last PT Received On:  07/31/12 Assistance Needed: +1    Subjective Data  Subjective: My lips are so  swollen Patient Stated Goal: to get back home.   Prior Functioning  Home Living Lives With: Family Available Help at Discharge: Family Type of Home: House Home Access: Stairs to enter [employer] of Steps: 1 Entrance Stairs-Rails: None Home Layout: Two level;Able to live on main level with bedroom/bathroom Bathroom Shower/Tub: Health visitor:  (elevated but not handicapped height) Home Adaptive Equipment: Shower chair without back;Walker - standard Prior Function Level of Independence: Independent with assistive device(s) Able to Take Stairs?: Yes Driving: Yes Vocation: Retired Musician: No difficulties    Cognition  Overall Cognitive Status: Appears within functional limits for tasks assessed/performed Arousal/Alertness: Awake/alert Orientation Level: Appears intact for tasks assessed Behavior During Session: [facility] for tasks performed    Extremity/Trunk Assessment Right Lower Extremity Assessment RLE ROM/Strength/Tone: WFL for tasks assessed RLE Sensation: WFL - Light Touch RLE Coordination: WFL - gross/fine motor Left Lower Extremity Assessment LLE ROM/Strength/Tone: Deficits LLE ROM/Strength/Tone Deficits: ankle motions WFL, able to perform hip abd in order to assist LLE out of bed with only min/guard assist for safety.  Will assess ROM during exercises in pm session.  LLE Sensation: WFL - Light Touch Trunk Assessment Trunk Assessment: Kyphotic   Balance    End of Session PT - End of Session Equipment Utilized During Treatment: Gait belt Activity Tolerance: Patient tolerated treatment well Patient left: in chair;with call bell/phone within reach Nurse Communication: Mobility status  GP     Page, Meribeth Mattes 07/31/2012, 8:48 AM

## 2012-07-31 NOTE — Progress Notes (Signed)
Physical Therapy Treatment Patient Details Name: Shuana Bogucki MRN: [id number] DOB: [birth year] Today's Date: 07/31/2012 Time: 4098-1191 PT Time Calculation (min): 28 min  PT Assessment / Plan / Recommendation Comments on Treatment Session  Pt progressing well with ambulation and recalling total hip precautions.  Also performed well with exercises and continues to state she only has "soreness" vs pain in hip.     Follow Up Recommendations  Home health PT    Barriers to Discharge None      Equipment Recommendations  Rolling walker with 5" wheels;3 in 1 bedside comode    Recommendations for Other Services OT consult  Frequency 7X/week   Plan Discharge plan remains appropriate    Precautions / Restrictions Precautions Precautions: Posterior Hip Precaution Booklet Issued: Yes (comment) Precaution Comments: Provided handout and educated Restrictions Weight Bearing Restrictions: No Other Position/Activity Restrictions: WBAT   Pertinent Vitals/Pain 1/10, ice pack applied    Mobility  Bed Mobility Bed Mobility: Sit to Supine Supine to Sit: 4: Min guard;HOB elevated Sitting - Scoot to Edge of Bed: 5: Supervision Sit to Supine: 4: Min assist;HOB flat Details for Bed Mobility Assistance: Min assist for LLE into bed with cues for technique and hip positioning once on bed.  Transfers Transfers: Sit to Stand;Stand to Sit Sit to Stand: 4: Min guard;With upper extremity assist;With armrests;From chair/3-in-1 Stand to Sit: 4: Min guard;With upper extremity assist;With armrests;To bed;To chair/3-in-1 Details for Transfer Assistance: Performed x 2 in order to use 3in1 in restroom.  Cues for hand placement and LE management in order to maintain THP.  Ambulation/Gait Ambulation/Gait Assistance: 4: Min guard Ambulation Distance (Feet): 160 Feet Assistive device: Rolling walker Ambulation/Gait Assistance Details: Cues for step to gait pattern vs step through in order to decreased WB on LLE when  there is increased pain.  Also min cues for upright posture.  Gait Pattern: Step-to pattern;Decreased stance time - left;Decreased step length - right;Trunk flexed Gait velocity: decreased Stairs: No Wheelchair Mobility Wheelchair Mobility: No    Exercises Total Joint Exercises Ankle Circles/Pumps: AROM;Both;20 reps Quad Sets: AROM;Strengthening;Left;10 reps Short Arc Quad: AROM;Strengthening;Left;10 reps Heel Slides: AROM;Left;10 reps Hip ABduction/ADduction: AROM;Left;10 reps   PT Diagnosis: Difficulty walking;Generalized weakness;Acute pain  PT Problem List: Decreased strength;Decreased range of motion;Decreased activity tolerance;Decreased balance;Decreased mobility;Decreased knowledge of use of DME;Decreased knowledge of precautions;Pain PT Treatment Interventions: DME instruction;Gait training;Stair training;Functional mobility training;Therapeutic activities;Therapeutic exercise;Balance training;Patient/family education   PT Goals Acute Rehab PT Goals PT Goal Formulation: With patient Time For Goal Achievement: 08/03/12 Potential to Achieve Goals: Good Pt will go Sit to Supine/Side: with supervision PT Goal: Sit to Supine/Side - Progress: Progressing toward goal Pt will go Sit to Stand: with modified independence PT Goal: Sit to Stand - Progress: Progressing toward goal Pt will Ambulate: 51 - 150 feet;with modified independence;with least restrictive assistive device PT Goal: Ambulate - Progress: Progressing toward goal Pt will Go Up / Down Stairs: 1-2 stairs;with supervision;with least restrictive assistive device PT Goal: Up/Down Stairs - Progress: Goal set today Pt will Perform Home Exercise Program: with supervision, verbal cues required/provided PT Goal: Perform Home Exercise Program - Progress: Progressing toward goal  Visit Information  Last PT Received On: 07/31/12 Assistance Needed: +1    Subjective Data  Subjective: I need to use the restroom first.  Patient  Stated Goal: to get back home.   Cognition  Overall Cognitive Status: Appears within functional limits for tasks assessed/performed Arousal/Alertness: Awake/alert Orientation Level: Appears intact for tasks assessed Behavior During Session: [facility] for  tasks performed    Balance     End of Session PT - End of Session Equipment Utilized During Treatment: Gait belt Activity Tolerance: Patient tolerated treatment well Patient left: in bed;with call bell/phone within reach Nurse Communication: Mobility status   GP     Page, Meribeth Mattes 07/31/2012, 11:50 AM

## 2012-08-01 DIAGNOSIS — M1612 Unilateral primary osteoarthritis, left hip: Secondary | ICD-10-CM | POA: Diagnosis present

## 2012-08-01 LAB — CBC
MCH: 31.4 pg (ref 26.0–34.0)
MCV: 94.6 fL (ref 78.0–100.0)
Platelets: 160 10*3/uL (ref 150–400)
RBC: 2.58 MIL/uL — ABNORMAL LOW (ref 3.87–5.11)
RDW: 13.5 % (ref 11.5–15.5)

## 2012-08-01 MED ORDER — OXYCODONE-ACETAMINOPHEN 5-325 MG PO TABS: 1.0000 | ORAL_TABLET | ORAL | Status: DC | PRN

## 2012-08-01 MED ORDER — METHOCARBAMOL 500 MG PO TABS: 500.0000 mg | ORAL_TABLET | Freq: Four times a day (QID) | ORAL | Status: DC | PRN

## 2012-08-01 MED ORDER — ASPIRIN 325 MG PO TBEC: 325.0000 mg | DELAYED_RELEASE_TABLET | Freq: Two times a day (BID) | ORAL | Status: DC

## 2012-08-01 NOTE — Progress Notes (Signed)
Occupational Therapy Discharge Patient Details Name: Lori Wallace MRN: [id number] DOB: 1936-01-29 Today's Date: 08/01/2012 Time: 4098-1191 OT Time Calculation (min): 33 min  Patient discharged from OT services secondary to Pt. educated on hip precautions and able to recall 3/3. Pt also educated on AE equipment for LE bathing and dressing and able to show return demonstration .  Please see latest therapy progress note for current level of functioning and progress toward goals.    Progress and discharge plan discussed with patient and/or caregiver: Patient/Caregiver agrees with plan  GO     Cleora Fleet 08/01/2012, 10:17 AM

## 2012-08-01 NOTE — Progress Notes (Signed)
Occupational Therapy Evaluation Patient Details Name: Lori Wallace MRN: [id number] DOB: Mar 17, 1936 Today's Date: 08/01/2012 Time: 4098-1191 OT Time Calculation (min): 33 min  OT Assessment / Plan / Recommendation Clinical Impression  Pt. is 76 yo female s/p left THA. Pt. educated on hip precautions and able to recall 3/3. Pt also educated on AE for LE bathing and dressing. No acute OT needs at this time. Pt. to d/c home    OT Assessment  Patient does not need any further OT services    Follow Up Recommendations  Home health OT    Barriers to Discharge      Equipment Recommendations  Rolling walker with 5" wheels;3 in 1 bedside comode    Recommendations for Other Services    Frequency       Precautions / Restrictions Precautions Precautions: Posterior Hip Precaution Booklet Issued: Yes (comment) Precaution Comments: Provided handout and educated Restrictions Weight Bearing Restrictions: No Other Position/Activity Restrictions: WBAT   Pertinent Vitals/Pain No complaints from patient, just general soreness in the muscles surrounding her hip     ADL  Grooming: Performed;Wash/dry hands;Modified independent Where Assessed - Grooming: Supported standing Lower Body Bathing: Simulated;Modified independent Where Assessed - Lower Body Bathing: Unsupported sitting Lower Body Dressing: Performed;Modified independent Where Assessed - Lower Body Dressing: Supported sit to Pharmacist, hospital: Performed;Modified independent Toilet Transfer Method: Sit to stand Toilet Transfer Equipment: Raised toilet seat with arms (or 3-in-1 over toilet) Toileting - Clothing Manipulation and Hygiene: Performed;Independent Where Assessed - Toileting Clothing Manipulation and Hygiene: Sit to stand from 3-in-1 or toilet Tub/Shower Transfer: Simulated;Min guard Tub/Shower Transfer Method: Science writer: Walk in shower Equipment Used: Gait belt;Rolling  walker Transfers/Ambulation Related to ADLs: Pt. able to ambulate in room with supervision for safety. Required verbal cues for LLE placement while transfering sit<>stand and stand<>sit ADL Comments: Pt. educated on hip precautions and able to recall 3/3. Pt. also educated on AE for LE bathing and dressing. Pt. reports some nervousness about being able to shower and dress at home using AE. Pt. wants HH to help her adjust once home and re-educate on use of hip kit equipment.     OT Diagnosis:    OT Problem List:   OT Treatment Interventions:     OT Goals    Visit Information  Last OT Received On: 08/01/12 Assistance Needed: +1    Subjective Data  Subjective: I am a little nervous about going home but with my handouts and home health I should be fine Patient Stated Goal: To go home   Prior Functioning  Vision/Perception  Home Living Lives With: Family Available Help at Discharge: Family Type of Home: House Home Access: Stairs to enter Secretary/administrator of Steps: 1 Entrance Stairs-Rails: None Home Layout: Two level;Able to live on main level with bedroom/bathroom Bathroom Shower/Tub: Health visitor:  (elevated but not handicapped) Home Adaptive Equipment: Shower chair without back;Walker - standard Prior Function Level of Independence: Independent with assistive device(s) Able to Take Stairs?: Yes Driving: Yes Vocation: Retired Musician: No difficulties Dominant Hand: Right      Cognition  Overall Cognitive Status: Appears within functional limits for tasks assessed/performed Arousal/Alertness: Awake/alert Orientation Level: Oriented X4 / Intact Behavior During Session: WFL for tasks performed    Extremity/Trunk Assessment     Mobility    Bed Mobility Bed Mobility: Sit to Supine;Sitting - Scoot to Edge of Bed Supine to Sit: 5: Supervision;HOB elevated Sitting - Scoot to Edge of Bed: 5: Supervision Details  for Bed Mobility  Assistance: Pt. needed verbal cues to keep toes straight in order to keep hip precautions Transfers Transfers: Sit to Stand;Stand to Sit Sit to Stand: 5: Supervision;From chair/3-in-1;From bed;With upper extremity assist Stand to Sit: 5: Supervision;To chair/3-in-1;With upper extremity assist;With armrests Details for Transfer Assistance: Required cues for LLE placement throughout transfers. Pt. reports keeping her leg out in front is the hardest thing for her to remember                 End of Session OT - End of Session Equipment Utilized During Treatment: Gait belt Activity Tolerance: Patient tolerated treatment well Patient left: in chair;with call bell/phone within reach Nurse Communication: Precautions;Mobility status  GO     Cleora Fleet 08/01/2012, 10:16 AM

## 2012-08-01 NOTE — Progress Notes (Signed)
CARE MANAGEMENT NOTE 08/01/2012  Patient:  Lori, Wallace   Account Number:  [id number]  Date Initiated:  08/01/2012  Documentation initiated by:  Vance Peper  Subjective/Objective Assessment:   76 yr old female s/p left total hip arthroplasty     Action/Plan:   patient preoperatively setup with  [facility], no changes.  DME has been delivered to patient.   Anticipated DC Date:  08/01/2012   Anticipated DC Plan:  HOME W HOME HEALTH SERVICES      DC Planning Services  CM consult      [facility] Choice  HOME HEALTH  DURABLE MEDICAL EQUIPMENT   Choice offered to / List presented to:     DME arranged  3-N-1  WALKER - ROLLING      DME agency  [employer]     HH arranged  HH-2 PT      HH agency  [facility]   Status of service:  Completed, signed off Medicare Important Message given?   (If response is "NO", the following Medicare IM given date fields will be blank) Date Medicare IM given:   Date Additional Medicare IM given:    Discharge Disposition:  HOME W HOME HEALTH SERVICES  Per UR Regulation:    If discussed at Long Length of Stay Meetings, dates discussed:    Comments:

## 2012-08-01 NOTE — Progress Notes (Signed)
PATIENT ID: Lori Wallace  MRN: [id number]  DOB/AGE:  1936/03/30 / 76 y.o.  2 Days Post-Op Procedure(s) (LRB): TOTAL HIP ARTHROPLASTY (Left)    PROGRESS NOTE Subjective: Patient is alert, oriented,no Nausea, no Vomiting, yes passing gas, no Bowel Movement. Taking PO well. Denies SOB, Chest or Calf Pain. Using Incentive Spirometer, PAS in place. Ambulating well with PT. Patient reports pain as moderate  .    Objective: Vital signs in last 24 hours: Filed Vitals:   07/31/12 0648 07/31/12 1324 07/31/12 2054 08/01/12 0730  BP: 107/46 109/50 127/37 152/59  Pulse: 62 75 85 83  Temp: 97.7 F (36.5 C) 98.6 F (37 C) 98.3 F (36.8 C) 98 F (36.7 C)  TempSrc:      Resp: 16 18 18 18   SpO2: 100% 99% 100% 98%      Intake/Output from previous day: I/O last 3 completed shifts: In: 2940 [P.O.:1440; I.V.:1500] Out: 900 [Urine:900]   Intake/Output this shift:     LABORATORY DATA:  Basename 08/01/12 0559 07/31/12 0535  WBC 11.4* 10.0  HGB 8.1* 8.1*  HCT 24.4* 24.5*  PLT 160 162  NA -- 136  K -- 4.4  CL -- 103  CO2 -- 27  BUN -- 17  CREATININE -- 0.71  GLUCOSE -- 223*  GLUCAP -- --  INR -- --  CALCIUM -- 8.0*    Examination: Neurologically intact ABD soft Neurovascular intact Sensation intact distally Intact pulses distally Dorsiflexion/Plantar flexion intact Incision: moderate drainage} XR AP&Lat of hip shows well placed\fixed THA  Assessment:   2 Days Post-Op Procedure(s) (LRB): TOTAL HIP ARTHROPLASTY (Left) ADDITIONAL DIAGNOSIS:  acute blood loss anemia - asymptomatic  Plan: PT/OT WBAT, THA  posterior precautions  DVT Prophylaxis: SCDx72 hrs, ASA 325 mg BID x 2 weeks  Dressing change today.  DISCHARGE PLAN: Home today  DISCHARGE NEEDS: HHPT, HHRN, Walker and 3-in-1 comode seat

## 2012-08-01 NOTE — Discharge Summary (Signed)
Patient ID: Lori Wallace MRN: [id number] DOB/AGE: April 19, 1936 76 y.o.  Admit date: 07/30/2012 Discharge date: 08/01/2012  Admission Diagnoses:  Principal Problem:  *Osteoarthritis of left hip   Discharge Diagnoses:  Same  Past Medical History  Diagnosis Date  . Arthritis   . Cancer     breast s/p bilateral lumpectomies with radiation,    Surgeries: Procedure(s): TOTAL HIP ARTHROPLASTY on 07/30/2012   Consultants:    Discharged Condition: Improved  Hospital Course: Lori Wallace is an 76 y.o. female who was admitted 07/30/2012 for operative treatment ofOsteoarthritis of left hip. Patient has severe unremitting pain that affects sleep, daily activities, and work/hobbies. After pre-op clearance the patient was taken to the operating room on 07/30/2012 and underwent  Procedure(s): TOTAL HIP ARTHROPLASTY.    Patient was given perioperative antibiotics: Anti-infectives     Start     Dose/Rate Route Frequency Ordered Stop   07/29/12 1450   ceFAZolin (ANCEF) IVPB 2 g/50 mL premix        2 g 100 mL/hr over 30 Minutes Intravenous 60 min pre-op 07/29/12 1450 07/30/12 1228           Patient was given sequential compression devices, early ambulation, and chemoprophylaxis to prevent DVT.  Patient benefited maximally from hospital stay and there were no complications.    Recent vital signs: Patient Vitals for the past 24 hrs:  BP Temp Pulse Resp SpO2  08/01/12 0730 152/59 mmHg 98 F (36.7 C) 83  18  98 %  08-Aug-2012 2054 127/37 mmHg 98.3 F (36.8 C) 85  18  100 %  08-Aug-2012 1324 109/50 mmHg 98.6 F (37 C) 75  18  99 %     Recent laboratory studies:  Basename 08/01/12 0559 08/08/2012 0535  WBC 11.4* 10.0  HGB 8.1* 8.1*  HCT 24.4* 24.5*  PLT 160 162  NA -- 136  K -- 4.4  CL -- 103  CO2 -- 27  BUN -- 17  CREATININE -- 0.71  GLUCOSE -- 223*  INR -- --  CALCIUM -- 8.0*     Discharge Medications:     Medication List     As of 08/01/2012  8:19 AM    STOP taking these  medications         ALEVE 220 MG Caps   Generic drug: Naproxen Sodium      HYDROcodone-acetaminophen 5-325 MG per tablet   Commonly known as: NORCO/VICODIN      meloxicam 15 MG tablet   Commonly known as: MOBIC      traMADol 50 MG tablet   Commonly known as: ULTRAM      TAKE these medications         aspirin 325 MG EC tablet   Take 1 tablet (325 mg total) by mouth 2 (two) times daily.      methocarbamol 500 MG tablet   Commonly known as: ROBAXIN   Take 1 tablet (500 mg total) by mouth every 6 (six) hours as needed.      OVER THE COUNTER MEDICATION   Take 1 tablet by mouth daily. Biosil      oxyCODONE-acetaminophen 5-325 MG per tablet   Commonly known as: PERCOCET/ROXICET   Take 1-2 tablets by mouth every 4 (four) hours as needed for pain.      PROBIOTIC DAILY PO   Take 1 tablet by mouth daily.      REFRESH OP   Place 1 drop into both eyes daily as needed. For dry eyes  thiamine 50 MG tablet   Commonly known as: VITAMIN B-1   Take 50 mg by mouth See admin instructions. 2-3 times weekly        Diagnostic Studies: Dg Chest 2 View  07/25/2012  *RADIOLOGY REPORT*  Clinical Data: Preop for hip arthroplasty  CHEST - 2 VIEW  Comparison: None.  Findings: Cardiomediastinal silhouette is unremarkable.  Mild hyperinflation.  Streaky atelectasis lung bases.  No acute infiltrate or pulmonary edema.  Surgical clips are noted in the left axilla.  Osteopenia and mild degenerative changes mid thoracic spine.  Mild atherosclerotic calcifications of thoracic aorta.  IMPRESSION:  Mild hyperinflation.  Streaky atelectasis lung bases.  No acute infiltrate or pulmonary edema.  Surgical clips are noted in the left axilla.  Osteopenia and mild degenerative changes mid thoracic spine.   Original Report Authenticated By: Natasha Mead, M.D.    Dg Pelvis Portable  07/30/2012  *RADIOLOGY REPORT*  Clinical Data: Postop left hip replacement.  PORTABLE PELVIS  Comparison: Lateral view left hip  07/30/2012  Findings: Single view of the pelvis demonstrates a left hip arthroplasty.  The distal aspect of the femoral stem is not evaluated on this image.  Both hips appear to be located.  No acute abnormality involving the pelvic bony ring.  There is some joint space loss in the right hip.  IMPRESSION: Left hip arthroplasty without complicating features.   Original Report Authenticated By: Richarda Overlie, M.D.    Dg Hip Portable 1 View Left  07/30/2012  *RADIOLOGY REPORT*  Clinical Data: Postop left hip replacement.  PORTABLE LEFT HIP - 1 VIEW  Comparison: Pelvis 07/30/2012  Findings: Single lateral view of the left hip was obtained.  The left hip arthroplasty appears located on this single view.  The distal aspect of the femoral stem is visualized and no evidence for a periprosthetic fracture.  IMPRESSION: Left hip arthroplasty without complicating features.   Original Report Authenticated By: Richarda Overlie, M.D.     Disposition: Final discharge disposition not confirmed      Discharge Orders    Future Orders Please Complete By Expires   Increase activity slowly      Walker       May shower / Bathe      Driving Restrictions      Comments:   No driving for 2 weeks.   Change dressing (specify)      Comments:   Dressing change as needed.   Call MD for:  temperature >100.4      Call MD for:  severe uncontrolled pain      Call MD for:  redness, tenderness, or signs of infection (pain, swelling, redness, odor or green/yellow discharge around incision site)      Discharge instructions      Comments:   F/U with Dr. Turner Daniels as scheduled.         SignedHazle Nordmann. 08/01/2012, 8:19 AM

## 2012-08-01 NOTE — Progress Notes (Signed)
I agree with the following treatment note after reviewing documentation.   Johnston, Dashun Borre Brynn   OTR/L Pager: [phone] Office: [phone] .   

## 2012-08-01 NOTE — Progress Notes (Signed)
Physical Therapy Treatment and DIscharge Patient Details Name: Lori Wallace MRN: [id number] DOB: [birth year] Today's Date: 08/01/2012 Time: 1100-1116 PT Time Calculation (min): 16 min  PT Assessment / Plan / Recommendation Comments on Treatment Session  Pt has progressed well and is ready for d/c today with f/u with HHPT. Pt continues to have difficulty lifting her left leg, exercises reviewed and importance of frequent mobility emphasized.  PT signing off.    Follow Up Recommendations  Home health PT    Barriers to Discharge        Equipment Recommendations  Rolling walker with 5" wheels;3 in 1 bedside comode    Recommendations for Other Services OT consult  Frequency 7X/week   Plan All goals met and education completed, patient dischaged from PT services    Precautions / Restrictions Precautions Precautions: Posterior Hip Precaution Booklet Issued: No Precaution Comments: reviewed precautions and reasoning for them.  Pt recalled "no bending past 90 degrees" Restrictions Weight Bearing Restrictions: No Other Position/Activity Restrictions: WBAT   Pertinent Vitals/Pain Just "sore", not achy    Mobility  Bed Mobility Bed Mobility: Not assessed Transfers Transfers: Sit to Stand;Stand to Sit Sit to Stand: 6: Modified independent (Device/Increase time);From chair/3-in-1;With armrests;From toilet Stand to Sit: 6: Modified independent (Device/Increase time);To chair/3-in-1;With armrests;To toilet Details for Transfer Assistance: pt performed transfers safely without needed assist Ambulation/Gait Ambulation/Gait Assistance: 6: Modified independent (Device/Increase time) Ambulation Distance (Feet): 120 Feet Assistive device: Rolling walker Ambulation/Gait Assistance Details: vc's for upright posture and beginning a more normal, step-through, pattern Gait Pattern: Step-through pattern;Decreased stance time - left;Trunk flexed Stairs: Yes Stairs Assistance: 4: Min assist;5:  Supervision Stairs Assistance Details (indicate cue type and reason): supervision to ascend, min HHA to descend since only one rail available at home Stair Management Technique: One rail Right;Step to pattern;Forwards Number of Stairs: 2  Wheelchair Mobility Wheelchair Mobility: No    Exercises Total Joint Exercises Ankle Circles/Pumps: AROM;Both;20 reps Quad Sets:  (reviewed exercises)   PT Diagnosis:    PT Problem List:   PT Treatment Interventions:     PT Goals Acute Rehab PT Goals PT Goal Formulation: With patient Time For Goal Achievement: 08/03/12 Potential to Achieve Goals: Good Pt will go Sit to Supine/Side: with supervision PT Goal: Sit to Supine/Side - Progress: Met Pt will go Sit to Stand: with modified independence PT Goal: Sit to Stand - Progress: Met Pt will Ambulate: 51 - 150 feet;with modified independence;with least restrictive assistive device PT Goal: Ambulate - Progress: Met Pt will Go Up / Down Stairs: 1-2 stairs;with supervision;with least restrictive assistive device PT Goal: Up/Down Stairs - Progress: Met Pt will Perform Home Exercise Program: with supervision, verbal cues required/provided PT Goal: Perform Home Exercise Program - Progress: Met  Visit Information  Last PT Received On: 08/01/12 Assistance Needed: +1    Subjective Data  Subjective: I am ready to go home Patient Stated Goal: to get back home.   Cognition  Overall Cognitive Status: Appears within functional limits for tasks assessed/performed Arousal/Alertness: Awake/alert Orientation Level: Oriented X4 / Intact Behavior During Session: [facility] for tasks performed    Balance  Balance Balance Assessed: No  End of Session PT - End of Session Equipment Utilized During Treatment: Gait belt Activity Tolerance: Patient tolerated treatment well Patient left: in chair;with call bell/phone within reach Nurse Communication: Mobility status   GP   Lyanne Co, PT  Acute Rehab  Services  [phone]   Lyanne Co 08/01/2012, 1:44 PM

## 2012-08-01 NOTE — Progress Notes (Signed)
I agree with the following treatment note after reviewing documentation.   Johnston, Rayelynn Loyal Brynn   OTR/L Pager: [phone] Office: [phone] .   

## 2012-11-19 ENCOUNTER — Ambulatory Visit: Payer: Self-pay | Admitting: General Surgery

## 2013-05-07 ENCOUNTER — Encounter: Payer: Self-pay | Admitting: *Deleted

## 2013-11-22 ENCOUNTER — Ambulatory Visit: Payer: Self-pay | Admitting: General Surgery

## 2013-11-22 ENCOUNTER — Encounter: Payer: Self-pay | Admitting: General Surgery

## 2013-12-02 ENCOUNTER — Other Ambulatory Visit: Payer: Medicare PPO

## 2013-12-02 ENCOUNTER — Ambulatory Visit (INDEPENDENT_AMBULATORY_CARE_PROVIDER_SITE_OTHER): Payer: Medicare PPO | Admitting: General Surgery

## 2013-12-02 ENCOUNTER — Encounter: Payer: Self-pay | Admitting: General Surgery

## 2013-12-02 VITALS — BP 160/80 | HR 72 | Resp 14 | Ht 71.0 in | Wt 176.0 lb

## 2013-12-02 DIAGNOSIS — R234 Changes in skin texture: Secondary | ICD-10-CM

## 2013-12-02 DIAGNOSIS — C50919 Malignant neoplasm of unspecified site of unspecified female breast: Secondary | ICD-10-CM

## 2013-12-02 NOTE — Patient Instructions (Signed)
Patient to return in one year bilateral diagnotic mammogram.  

## 2013-12-02 NOTE — Progress Notes (Signed)
Patient ID: Lori Wallace, female   DOB: [birth year], 78 y.o.   MRN: [id number]  Chief Complaint  Patient presents with  . Follow-up    mammogram    HPI Lori Wallace is a 78 y.o. female who presents for a breast evaluation. The most recent mammogram was done on 11/22/13.Patient does perform regular self breast checks and gets regular mammograms done.  The patient reports no difficulty since her last visit.  She declined to make use of an antiestrogen nasal ports. With both tamoxifen and Arimidex at the time of her 1995 breast cancer diagnosis in [place].  HPI  Past Medical History  Diagnosis Date  . Arthritis   . Cancer     breast s/p bilateral lumpectomies with radiation,  . Malignant neoplasm of upper-outer quadrant of female breast [place] wide excision, reexcision, whole breast radiation.  . 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 T1a  . Malignant neoplasm of upper-outer quadrant of female breast 2002    Right breast    Past Surgical History  Procedure Laterality Date  . Joint replacement      R knee w/prior arhtroscopy  . Back surgery  1960s    lumbar ruptured discs  . Breast surgery      lumpectomies x 3  . Eye surgery  1990s    bilat cataract removal  . Fracture surgery      L wrist repair, R lower leg  . Uterine tumor excision      benign  . Total hip arthroplasty  07/30/2012    Procedure: TOTAL HIP ARTHROPLASTY;  Surgeon: Kerin Salen, MD;  Location: [facility];  Service: Orthopedics;  Laterality: Left;    No family history on file.  Social History History  Substance Use Topics  . Smoking status: Former Research scientist (life sciences)  . Smokeless tobacco: Never Used  . Alcohol Use: No    Allergies  Allergen Reactions  . Carrot [Daucus Carota] Itching  . Demerol [Meperidine] Nausea And Vomiting  . Peanut-Containing Drug Products Itching    Current Outpatient  Prescriptions  Medication Sig Dispense Refill  . cholecalciferol (VITAMIN D) 1000 UNITS tablet Take 1,000 Units by mouth daily.      . Polyvinyl Alcohol-Povidone (REFRESH OP) Place 1 drop into both eyes daily as needed. For dry eyes      . Probiotic Product (PROBIOTIC DAILY PO) Take 1 tablet by mouth daily.      Marland Kitchen thiamine (VITAMIN B-1) 50 MG tablet Take 50 mg by mouth See admin instructions. 2-3 times weekly       No current facility-administered medications for this visit.    Review of Systems Review of Systems  Constitutional: Negative.   Respiratory: Negative.   Cardiovascular: Negative.     Blood pressure 160/80, pulse 72, resp. rate 14, height $RemoveBe'5\' 11"'aahAgdADj$  (1.803 m), weight 176 lb (79.833 kg).  Physical Exam Physical Exam  Constitutional: She is oriented to person, place, and time. She appears well-developed and well-nourished.  Eyes: Conjunctivae are normal.  Neck: Neck supple.  Cardiovascular: Normal rate, regular rhythm and normal heart sounds.   Pulmonary/Chest: Effort normal and breath sounds normal. Right breast exhibits no inverted nipple, no mass, no nipple discharge, no skin change and no tenderness. Left breast exhibits no inverted nipple, no mass, no nipple discharge, no skin change and no tenderness.     1 cm thickening  at the scar upper outer quadrant right breast. Left breast 12 o'clock slight dumpling from prevent excision.   Abdominal: Bowel sounds are normal.  Lymphadenopathy:    She has no cervical adenopathy.    She has no axillary adenopathy.  Neurological: She is alert and oriented to person, place, and time.  Skin: Skin is warm and dry.    Data Reviewed Bilateral mammogram dated November 22, 2013 were reviewed and related to past studies. Postsurgical changes were identified. BI-RAD-2.  Ultrasound examination of the area of focal nodularity along the right upper outer quadrant incision shows a superficial mass with dense shadowing measuring 0.5 x 0.7 x 0.5  cm. This correlates with the area of dense calcification in her recent mammograms.  Assessment    Stable exam status post bilateral breast cancer with left breast recurrence.  Reported intolerance to antiestrogen therapy.     Plan    Followup examination in one year will be arranged.        Robert Bellow 12/03/2013, 6:05 AM   A

## 2013-12-03 ENCOUNTER — Encounter: Payer: Self-pay | Admitting: General Surgery

## 2013-12-03 DIAGNOSIS — C50919 Malignant neoplasm of unspecified site of unspecified female breast: Secondary | ICD-10-CM | POA: Insufficient documentation

## 2013-12-30 ENCOUNTER — Encounter: Payer: Self-pay | Admitting: General Surgery

## 2014-09-08 ENCOUNTER — Encounter: Payer: Self-pay | Admitting: General Surgery

## 2014-09-24 ENCOUNTER — Emergency Department: Payer: Self-pay | Admitting: Emergency Medicine

## 2014-09-24 LAB — URINALYSIS, COMPLETE
Bilirubin,UR: NEGATIVE
Blood: NEGATIVE
Glucose,UR: NEGATIVE mg/dL (ref 0–75)
Ketone: NEGATIVE
Leukocyte Esterase: NEGATIVE
Nitrite: NEGATIVE
Ph: 8 (ref 4.5–8.0)
Protein: NEGATIVE
Specific Gravity: 1.004 (ref 1.003–1.030)
Squamous Epithelial: <1

## 2014-09-24 LAB — CBC WITH DIFFERENTIAL/PLATELET
Basophil #: 0.1 10*3/uL (ref 0.0–0.1)
Basophil %: 0.7 %
Eosinophil #: 0.2 10*3/uL (ref 0.0–0.7)
Eosinophil %: 2.6 %
HCT: 41.7 % (ref 35.0–47.0)
HGB: 13.8 g/dL (ref 12.0–16.0)
LYMPHS ABS: 2.1 10*3/uL (ref 1.0–3.6)
LYMPHS PCT: 23.7 %
MCH: 31.6 pg (ref 26.0–34.0)
MCHC: 33.2 g/dL (ref 32.0–36.0)
MCV: 95 fL (ref 80–100)
MONO ABS: 0.7 x10 3/mm (ref 0.2–0.9)
Monocyte %: 8 %
NEUTROS PCT: 65 %
Neutrophil #: 5.7 10*3/uL (ref 1.4–6.5)
Platelet: 281 10*3/uL (ref 150–440)
RBC: 4.37 10*6/uL (ref 3.80–5.20)
RDW: 12.8 % (ref 11.5–14.5)
WBC: 8.8 10*3/uL (ref 3.6–11.0)

## 2014-09-24 LAB — COMPREHENSIVE METABOLIC PANEL
ANION GAP: 5 — AB (ref 7–16)
Albumin: 4 g/dL (ref 3.4–5.0)
Alkaline Phosphatase: 110 U/L
BUN: 13 mg/dL (ref 7–18)
Bilirubin,Total: 0.6 mg/dL (ref 0.2–1.0)
CHLORIDE: 102 mmol/L (ref 98–107)
Calcium, Total: 8.9 mg/dL (ref 8.5–10.1)
Co2: 31 mmol/L (ref 21–32)
Creatinine: 0.9 mg/dL (ref 0.60–1.30)
EGFR (African American): >60
Glucose: 107 mg/dL — ABNORMAL HIGH (ref 65–99)
OSMOLALITY: 276 (ref 275–301)
POTASSIUM: 4.2 mmol/L (ref 3.5–5.1)
SGOT(AST): 22 U/L (ref 15–37)
SGPT (ALT): 26 U/L
Sodium: 138 mmol/L (ref 136–145)
TOTAL PROTEIN: 8.9 g/dL — AB (ref 6.4–8.2)

## 2014-10-14 ENCOUNTER — Other Ambulatory Visit: Payer: Self-pay | Admitting: Orthopedic Surgery

## 2014-10-20 NOTE — Pre-Procedure Instructions (Signed)
JAPJI KOK  10/20/2014   Your procedure is scheduled on:  Thursday October 23, 2014   Report to [facility] after calling [phone] at 8 AM for arrival time  Call this number if you have problems the morning of surgery: [phone]   For any other questions M-F from 8am-4pm call: [phone]   Remember:   Do not eat food or drink liquids after midnight.   Take these medicines the morning of surgery with A SIP OF WATER: Tramadol (Ultram) if needed   Please stop taking vitamins, ibuprofen, omega 3   Do not wear jewelry, make-up or nail polish.  Do not wear lotions, powders, or perfumes.   Do not shave 48 hours prior to surgery.   Do not bring valuables to the hospital.  [facility] is not responsible for any belongings or valuables.               Contacts, dentures or bridgework may not be worn into surgery.  Leave suitcase in the car. After surgery it may be brought to your room.  For patients admitted to the hospital, discharge time is determined by your treatment team.               Patients discharged the day of surgery will not be allowed to drive home.  Name and phone number of your driver:   Special Instructions: Shower using CHG soap the night before and the morning of your surgery   Please read over the following fact sheets that you were given: Pain Booklet, Coughing and Deep Breathing, Blood Transfusion Information, MRSA Information and Surgical Site Infection Prevention

## 2014-10-21 ENCOUNTER — Encounter (HOSPITAL_COMMUNITY): Payer: Self-pay

## 2014-10-21 ENCOUNTER — Encounter (HOSPITAL_COMMUNITY)
Admission: RE | Admit: 2014-10-21 | Discharge: 2014-10-21 | Disposition: A | Discharge status: Home / Self Care | Payer: Commercial Managed Care - HMO | Source: Ambulatory Visit | Attending: Orthopedic Surgery | Admitting: Orthopedic Surgery

## 2014-10-21 ENCOUNTER — Ambulatory Visit (HOSPITAL_COMMUNITY)
Admission: RE | Admit: 2014-10-21 | Discharge: 2014-10-21 | Disposition: A | Discharge status: Home / Self Care | Payer: Commercial Managed Care - HMO | Source: Ambulatory Visit | Attending: Orthopedic Surgery | Admitting: Orthopedic Surgery

## 2014-10-21 DIAGNOSIS — I517 Cardiomegaly: Secondary | ICD-10-CM | POA: Insufficient documentation

## 2014-10-21 DIAGNOSIS — I10 Essential (primary) hypertension: Secondary | ICD-10-CM | POA: Diagnosis not present

## 2014-10-21 DIAGNOSIS — M4854XA Collapsed vertebra, not elsewhere classified, thoracic region, initial encounter for fracture: Secondary | ICD-10-CM | POA: Insufficient documentation

## 2014-10-21 DIAGNOSIS — Z01818 Encounter for other preprocedural examination: Secondary | ICD-10-CM

## 2014-10-21 HISTORY — DX: Frequency of micturition: R35.0

## 2014-10-21 HISTORY — DX: Essential (primary) hypertension: I10

## 2014-10-21 HISTORY — DX: Other allergic rhinitis: J30.89

## 2014-10-21 HISTORY — DX: Other complications of anesthesia, initial encounter: T88.59XA

## 2014-10-21 HISTORY — DX: Drug induced constipation: K59.03

## 2014-10-21 HISTORY — DX: Reserved for concepts with insufficient information to code with codable children: IMO0002

## 2014-10-21 HISTORY — DX: Adverse effect of unspecified anesthetic, initial encounter: T41.45XA

## 2014-10-21 LAB — URINALYSIS, ROUTINE W REFLEX MICROSCOPIC
BILIRUBIN URINE: NEGATIVE
Glucose, UA: NEGATIVE mg/dL
Hgb urine dipstick: NEGATIVE
KETONES UR: NEGATIVE mg/dL
Leukocytes, UA: NEGATIVE
NITRITE: NEGATIVE
Protein, ur: NEGATIVE mg/dL
Specific Gravity, Urine: 1.008 (ref 1.005–1.030)
UROBILINOGEN UA: 0.2 mg/dL (ref 0.0–1.0)
pH: 6.5 (ref 5.0–8.0)

## 2014-10-21 LAB — COMPREHENSIVE METABOLIC PANEL
ALBUMIN: 4 g/dL (ref 3.5–5.2)
ALT: 13 U/L (ref 0–35)
AST: 15 U/L (ref 0–37)
Alkaline Phosphatase: 119 U/L — ABNORMAL HIGH (ref 39–117)
Anion gap: 14 (ref 5–15)
BUN: 19 mg/dL (ref 6–23)
CALCIUM: 9.9 mg/dL (ref 8.4–10.5)
CO2: 25 mEq/L (ref 19–32)
CREATININE: 0.79 mg/dL (ref 0.50–1.10)
Chloride: 101 mEq/L (ref 96–112)
GFR calc Af Amer: >90 mL/min (ref >90)
GFR calc non Af Amer: 79 mL/min — ABNORMAL LOW (ref >90)
Glucose, Bld: 97 mg/dL (ref 70–99)
Potassium: 4.4 mEq/L (ref 3.7–5.3)
SODIUM: 140 meq/L (ref 137–147)
Total Bilirubin: 0.3 mg/dL (ref 0.3–1.2)
Total Protein: 8.5 g/dL — ABNORMAL HIGH (ref 6.0–8.3)

## 2014-10-21 LAB — CBC WITH DIFFERENTIAL/PLATELET
BASOS ABS: 0 10*3/uL (ref 0.0–0.1)
Basophils Relative: 0 % (ref 0–1)
EOS PCT: 3 % (ref 0–5)
Eosinophils Absolute: 0.3 10*3/uL (ref 0.0–0.7)
HEMATOCRIT: 40 % (ref 36.0–46.0)
Hemoglobin: 13.4 g/dL (ref 12.0–15.0)
Lymphocytes Relative: 25 % (ref 12–46)
Lymphs Abs: 2.4 10*3/uL (ref 0.7–4.0)
MCH: 31.2 pg (ref 26.0–34.0)
MCHC: 33.5 g/dL (ref 30.0–36.0)
MCV: 93.2 fL (ref 78.0–100.0)
MONO ABS: 0.6 10*3/uL (ref 0.1–1.0)
Monocytes Relative: 6 % (ref 3–12)
Neutro Abs: 6.3 10*3/uL (ref 1.7–7.7)
Neutrophils Relative %: 66 % (ref 43–77)
PLATELETS: 288 10*3/uL (ref 150–400)
RBC: 4.29 MIL/uL (ref 3.87–5.11)
RDW: 12.9 % (ref 11.5–15.5)
WBC: 9.7 10*3/uL (ref 4.0–10.5)

## 2014-10-21 LAB — SURGICAL PCR SCREEN
MRSA, PCR: NEGATIVE
STAPHYLOCOCCUS AUREUS: NEGATIVE

## 2014-10-21 LAB — TYPE AND SCREEN
ABO/RH(D): A POS
ANTIBODY SCREEN: NEGATIVE

## 2014-10-21 LAB — PROTIME-INR
INR: 1.03 (ref 0.00–1.49)
PROTHROMBIN TIME: 13.6 s (ref 11.6–15.2)

## 2014-10-21 LAB — APTT: APTT: 30 s (ref 24–37)

## 2014-10-21 NOTE — Progress Notes (Signed)
PCP is Glendon Axe. Patient denied having any chest pain or discomfort. Patient informed Nurse she had a stress test within the last 5 years at [facility]. Patient denied having a cardiac cath or sleep study.

## 2014-10-21 NOTE — Progress Notes (Signed)
Lab personnel called Nurse and stated that a urinalysis was not sent to lab. Nurse asked Short stay lab staff about urine and they stated it was sent in tube station by [facility] (lab tech) because some of it had spilled in bag so the bag was replaced and specimen was sent to lab. Nurse ordered a STAT urinalysis for DOS.

## 2014-10-22 MED ORDER — CEFAZOLIN SODIUM-DEXTROSE 2-3 GM-% IV SOLR
2.0000 g | INTRAVENOUS | Status: AC
  Administered 2014-10-23: 2 g via INTRAVENOUS
  Filled 2014-10-22: qty 50

## 2014-10-22 MED ORDER — POVIDONE-IODINE 7.5 % EX SOLN
Freq: Once | CUTANEOUS | Status: DC
  Filled 2014-10-22: qty 118

## 2014-10-22 NOTE — H&P (Signed)
PREOPERATIVE H&P  Chief Complaint: low back pain  HPI: Lori Wallace is a 78 y.o. female who presents with ongoing pain in the mid-low back  MRI and radiographs reveal an acute T12 compression fracture  Patient has failed multiple forms of conservative care and continues to have pain (see office notes for additional details regarding the patient's full course of treatment)  Past Medical History  Diagnosis Date  . Arthritis   . Cancer     breast s/p bilateral lumpectomies with radiation,  . Malignant neoplasm of upper-outer quadrant of female breast [place] wide excision, reexcision, whole breast radiation.  . 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 T1a  . Malignant neoplasm of upper-outer quadrant of female breast 2002    Right breast  . Complication of anesthesia     Pt stated her top lip was "puffed up" and felt strange for weeks after having hip surgery. Pt thinks intubation appliance may have caused it  . Hypertension     pt has not had any blood pressure medications in over 4 years  . Frequency of urination   . Constipation due to pain medication   . Trigger finger of both hands   . Environmental and seasonal allergies    Past Surgical History  Procedure Laterality Date  . Back surgery  1960s    lumbar ruptured discs  . Eye surgery  1990s    bilat cataract removal  . Uterine tumor excision      benign  . Total hip arthroplasty  07/30/2012    Procedure: TOTAL HIP ARTHROPLASTY;  Surgeon: Kerin Salen, MD;  Location: [facility];  Service: Orthopedics;  Laterality: Left;  . Joint replacement      R knee w/prior arhtroscopy  . Fracture surgery      L wrist repair, R lower leg  . Wrist surgery Left   . Breast surgery      lumpectomies x 3   History   Social History  . Marital Status: Single    Spouse Name: N/A    Number of Children: N/A  .  Years of Education: N/A   Social History Main Topics  . Smoking status: Former Research scientist (life sciences)  . Smokeless tobacco: Never Used  . Alcohol Use: No  . Drug Use: No  . Sexual Activity: Not on file   Other Topics Concern  . Not on file   Social History Narrative   No family history on file. Allergies  Allergen Reactions  . Carrot [Daucus Carota] Itching  . Demerol [Meperidine] Nausea And Vomiting  . Peanut-Containing Drug Products Itching   Prior to Admission medications   Medication Sig Start Date End Date Taking? Authorizing Provider  Calcium-Magnesium-Vitamin D (CALCIUM MAGNESIUM PO) Take 1 tablet by mouth daily.   Yes Historical Provider, MD  cholecalciferol (VITAMIN D) 1000 UNITS tablet Take 1,000 Units by mouth daily.   Yes Historical Provider, MD  ibuprofen (ADVIL,MOTRIN) 200 MG tablet Take 400 mg by mouth every 6 (six) hours as needed (pain).   Yes Historical Provider, MD  Omega-3 Fatty Acids (OMEGA 3 PO) Take 1 capsule by mouth daily.   Yes Historical Provider, MD  Polyvinyl Alcohol-Povidone (REFRESH OP) Place 1 drop into both eyes as needed (dry eyes).    Yes Historical Provider, MD  Probiotic Product (PROBIOTIC DAILY PO) Take 1 tablet by mouth  daily.   Yes Historical Provider, MD  traMADol (ULTRAM) 50 MG tablet Take 50 mg by mouth every 6 (six) hours as needed (pain).   Yes Historical Provider, MD     All other systems have been reviewed and were otherwise negative with the exception of those mentioned in the HPI and as above.  Physical Exam: There were no vitals filed for this visit.  General: Alert, no acute distress Cardiovascular: No pedal edema Respiratory: No cyanosis, no use of accessory musculature Skin: No lesions in the area of chief complaint Neurologic: Sensation intact distally Psychiatric: Patient is competent for consent with normal mood and affect Lymphatic: No axillary or cervical lymphadenopathy  MUSCULOSKELETAL: + TTP at  mid-back  Assessment/Plan: T12 compression fracture Plan for Procedure(s): KYPHOPLASTY T12   Sinclair Ship, MD 10/22/2014 12:47 PM

## 2014-10-23 ENCOUNTER — Inpatient Hospital Stay (HOSPITAL_COMMUNITY): Payer: Medicare HMO | Admitting: Certified Registered Nurse Anesthetist

## 2014-10-23 ENCOUNTER — Inpatient Hospital Stay (HOSPITAL_COMMUNITY)
Admission: RE | Admit: 2014-10-23 | Discharge: 2014-10-24 | DRG: 517 | Disposition: A | Discharge status: Home / Self Care | Payer: Medicare HMO | Source: Ambulatory Visit | Attending: Orthopedic Surgery | Admitting: Orthopedic Surgery

## 2014-10-23 ENCOUNTER — Encounter (HOSPITAL_COMMUNITY): Payer: Self-pay | Admitting: Certified Registered Nurse Anesthetist

## 2014-10-23 ENCOUNTER — Inpatient Hospital Stay (HOSPITAL_COMMUNITY): Payer: Medicare HMO | Admitting: Vascular Surgery

## 2014-10-23 ENCOUNTER — Inpatient Hospital Stay (HOSPITAL_COMMUNITY): Payer: Medicare HMO

## 2014-10-23 ENCOUNTER — Encounter (HOSPITAL_COMMUNITY)
Admission: RE | Disposition: A | Discharge status: Home / Self Care | Payer: Self-pay | Source: Ambulatory Visit | Attending: Orthopedic Surgery

## 2014-10-23 DIAGNOSIS — Z87891 Personal history of nicotine dependence: Secondary | ICD-10-CM | POA: Diagnosis not present

## 2014-10-23 DIAGNOSIS — Z96642 Presence of left artificial hip joint: Secondary | ICD-10-CM | POA: Diagnosis present

## 2014-10-23 DIAGNOSIS — M549 Dorsalgia, unspecified: Secondary | ICD-10-CM | POA: Diagnosis present

## 2014-10-23 DIAGNOSIS — Z96651 Presence of right artificial knee joint: Secondary | ICD-10-CM | POA: Diagnosis present

## 2014-10-23 DIAGNOSIS — IMO0002 Reserved for concepts with insufficient information to code with codable children: Secondary | ICD-10-CM

## 2014-10-23 DIAGNOSIS — Z853 Personal history of malignant neoplasm of breast: Secondary | ICD-10-CM

## 2014-10-23 DIAGNOSIS — M1990 Unspecified osteoarthritis, unspecified site: Secondary | ICD-10-CM | POA: Diagnosis present

## 2014-10-23 DIAGNOSIS — M4854XA Collapsed vertebra, not elsewhere classified, thoracic region, initial encounter for fracture: Secondary | ICD-10-CM | POA: Diagnosis present

## 2014-10-23 DIAGNOSIS — Z419 Encounter for procedure for purposes other than remedying health state, unspecified: Secondary | ICD-10-CM

## 2014-10-23 HISTORY — PX: KYPHOPLASTY: SHX5884

## 2014-10-23 SURGERY — KYPHOPLASTY
Anesthesia: General | Site: Spine Thoracic

## 2014-10-23 MED ORDER — NEOSTIGMINE METHYLSULFATE 10 MG/10ML IV SOLN
INTRAVENOUS | Status: AC
  Filled 2014-10-23: qty 1

## 2014-10-23 MED ORDER — VITAMIN D3 25 MCG (1000 UNIT) PO TABS
1000.0000 [IU] | ORAL_TABLET | Freq: Every day | ORAL | Status: DC
  Administered 2014-10-23: 1000 [IU] via ORAL
  Filled 2014-10-23 (×2): qty 1

## 2014-10-23 MED ORDER — SODIUM CHLORIDE 0.9 % IJ SOLN: 3.0000 mL | INTRAMUSCULAR | Status: DC | PRN

## 2014-10-23 MED ORDER — BACITRACIN 500 UNIT/GM EX OINT
TOPICAL_OINTMENT | CUTANEOUS | Status: DC | PRN
Start: 2014-10-23 — End: 2014-10-23
  Administered 2014-10-23: 1 via TOPICAL

## 2014-10-23 MED ORDER — ACETAMINOPHEN 325 MG PO TABS: 650.0000 mg | ORAL_TABLET | ORAL | Status: DC | PRN

## 2014-10-23 MED ORDER — ONDANSETRON HCL 4 MG/2ML IJ SOLN: 4.0000 mg | INTRAMUSCULAR | Status: DC | PRN

## 2014-10-23 MED ORDER — BISACODYL 5 MG PO TBEC
5.0000 mg | DELAYED_RELEASE_TABLET | Freq: Every day | ORAL | Status: DC | PRN
  Filled 2014-10-23: qty 1

## 2014-10-23 MED ORDER — PROMETHAZINE HCL 25 MG/ML IJ SOLN: 6.2500 mg | INTRAMUSCULAR | Status: DC | PRN

## 2014-10-23 MED ORDER — DEXAMETHASONE SODIUM PHOSPHATE 4 MG/ML IJ SOLN
INTRAMUSCULAR | Status: DC | PRN
  Administered 2014-10-23: 8 mg via INTRAVENOUS

## 2014-10-23 MED ORDER — BUPIVACAINE-EPINEPHRINE 0.25% -1:200000 IJ SOLN: INTRAMUSCULAR | Status: DC | PRN

## 2014-10-23 MED ORDER — IOHEXOL 300 MG/ML  SOLN
INTRAMUSCULAR | Status: DC | PRN
  Administered 2014-10-23: 50 mL

## 2014-10-23 MED ORDER — GLYCOPYRROLATE 0.2 MG/ML IJ SOLN
INTRAMUSCULAR | Status: DC | PRN
  Administered 2014-10-23: .7 mg via INTRAVENOUS

## 2014-10-23 MED ORDER — KETOROLAC TROMETHAMINE 30 MG/ML IJ SOLN: 30.0000 mg | Freq: Once | INTRAMUSCULAR | Status: DC | PRN

## 2014-10-23 MED ORDER — GLYCOPYRROLATE 0.2 MG/ML IJ SOLN
INTRAMUSCULAR | Status: AC
  Filled 2014-10-23: qty 3

## 2014-10-23 MED ORDER — SODIUM CHLORIDE 0.9 % IJ SOLN
3.0000 mL | Freq: Two times a day (BID) | INTRAMUSCULAR | Status: DC
  Administered 2014-10-23 (×2): 3 mL via INTRAVENOUS

## 2014-10-23 MED ORDER — LACTATED RINGERS IV SOLN
INTRAVENOUS | Status: DC | PRN
  Administered 2014-10-23 (×2): via INTRAVENOUS

## 2014-10-23 MED ORDER — PROPOFOL 10 MG/ML IV BOLUS
INTRAVENOUS | Status: DC | PRN
  Administered 2014-10-23: 100 mg via INTRAVENOUS

## 2014-10-23 MED ORDER — DOCUSATE SODIUM 100 MG PO CAPS
100.0000 mg | ORAL_CAPSULE | Freq: Two times a day (BID) | ORAL | Status: DC
  Administered 2014-10-23: 100 mg via ORAL
  Filled 2014-10-23 (×3): qty 1

## 2014-10-23 MED ORDER — PHENOL 1.4 % MT LIQD: 1.0000 | OROMUCOSAL | Status: DC | PRN

## 2014-10-23 MED ORDER — ZOLPIDEM TARTRATE 5 MG PO TABS: 5.0000 mg | ORAL_TABLET | Freq: Every evening | ORAL | Status: DC | PRN

## 2014-10-23 MED ORDER — FLEET ENEMA 7-19 GM/118ML RE ENEM: 1.0000 | ENEMA | Freq: Once | RECTAL | Status: AC | PRN

## 2014-10-23 MED ORDER — MORPHINE SULFATE 2 MG/ML IJ SOLN: 1.0000 mg | INTRAMUSCULAR | Status: DC | PRN

## 2014-10-23 MED ORDER — ONDANSETRON HCL 4 MG/2ML IJ SOLN
INTRAMUSCULAR | Status: AC
  Filled 2014-10-23: qty 2

## 2014-10-23 MED ORDER — BACITRACIN ZINC 500 UNIT/GM EX OINT
TOPICAL_OINTMENT | CUTANEOUS | Status: AC
  Filled 2014-10-23: qty 15

## 2014-10-23 MED ORDER — LIDOCAINE HCL (CARDIAC) 20 MG/ML IV SOLN
INTRAVENOUS | Status: AC
  Filled 2014-10-23: qty 5

## 2014-10-23 MED ORDER — EPHEDRINE SULFATE 50 MG/ML IJ SOLN
INTRAMUSCULAR | Status: AC
  Filled 2014-10-23: qty 1

## 2014-10-23 MED ORDER — LIDOCAINE HCL (CARDIAC) 20 MG/ML IV SOLN
INTRAVENOUS | Status: DC | PRN
  Administered 2014-10-23: 50 mg via INTRAVENOUS

## 2014-10-23 MED ORDER — NEOSTIGMINE METHYLSULFATE 10 MG/10ML IV SOLN
INTRAVENOUS | Status: DC | PRN
  Administered 2014-10-23: 4 mg via INTRAVENOUS

## 2014-10-23 MED ORDER — HYDROMORPHONE HCL 1 MG/ML IJ SOLN: 0.2500 mg | INTRAMUSCULAR | Status: DC | PRN

## 2014-10-23 MED ORDER — ROCURONIUM BROMIDE 100 MG/10ML IV SOLN
INTRAVENOUS | Status: DC | PRN
  Administered 2014-10-23: 30 mg via INTRAVENOUS

## 2014-10-23 MED ORDER — ROCURONIUM BROMIDE 50 MG/5ML IV SOLN
INTRAVENOUS | Status: AC
  Filled 2014-10-23: qty 1

## 2014-10-23 MED ORDER — OXYCODONE-ACETAMINOPHEN 5-325 MG PO TABS
1.0000 | ORAL_TABLET | ORAL | Status: DC | PRN
  Administered 2014-10-23: 1 via ORAL
  Filled 2014-10-23: qty 1

## 2014-10-23 MED ORDER — ACETAMINOPHEN 650 MG RE SUPP
650.0000 mg | RECTAL | Status: DC | PRN
Start: 2014-10-23 — End: 2014-10-24

## 2014-10-23 MED ORDER — FENTANYL CITRATE 0.05 MG/ML IJ SOLN
INTRAMUSCULAR | Status: AC
  Filled 2014-10-23: qty 5

## 2014-10-23 MED ORDER — CEFAZOLIN SODIUM 1-5 GM-% IV SOLN
1.0000 g | Freq: Three times a day (TID) | INTRAVENOUS | Status: AC
  Administered 2014-10-23 – 2014-10-24 (×2): 1 g via INTRAVENOUS
  Filled 2014-10-23 (×2): qty 50

## 2014-10-23 MED ORDER — LACTATED RINGERS IV SOLN
INTRAVENOUS | Status: DC
  Administered 2014-10-23: 11:00:00 via INTRAVENOUS

## 2014-10-23 MED ORDER — MENTHOL 3 MG MT LOZG: 1.0000 | LOZENGE | OROMUCOSAL | Status: DC | PRN

## 2014-10-23 MED ORDER — SENNOSIDES-DOCUSATE SODIUM 8.6-50 MG PO TABS
1.0000 | ORAL_TABLET | Freq: Every evening | ORAL | Status: DC | PRN
  Filled 2014-10-23: qty 1

## 2014-10-23 MED ORDER — DEXAMETHASONE SODIUM PHOSPHATE 4 MG/ML IJ SOLN
INTRAMUSCULAR | Status: AC
  Filled 2014-10-23: qty 2

## 2014-10-23 MED ORDER — ALUM & MAG HYDROXIDE-SIMETH 200-200-20 MG/5ML PO SUSP: 30.0000 mL | Freq: Four times a day (QID) | ORAL | Status: DC | PRN

## 2014-10-23 MED ORDER — BUPIVACAINE-EPINEPHRINE (PF) 0.25% -1:200000 IJ SOLN
INTRAMUSCULAR | Status: AC
  Filled 2014-10-23: qty 30

## 2014-10-23 MED ORDER — STERILE WATER FOR INJECTION IJ SOLN
INTRAMUSCULAR | Status: AC
  Filled 2014-10-23: qty 10

## 2014-10-23 MED ORDER — IBUPROFEN 400 MG PO TABS
400.0000 mg | ORAL_TABLET | Freq: Four times a day (QID) | ORAL | Status: DC | PRN
  Filled 2014-10-23: qty 1

## 2014-10-23 MED ORDER — DIAZEPAM 5 MG PO TABS: 5.0000 mg | ORAL_TABLET | Freq: Four times a day (QID) | ORAL | Status: DC | PRN

## 2014-10-23 MED ORDER — FENTANYL CITRATE 0.05 MG/ML IJ SOLN
INTRAMUSCULAR | Status: DC | PRN
  Administered 2014-10-23: 100 ug via INTRAVENOUS
  Administered 2014-10-23: 50 ug via INTRAVENOUS

## 2014-10-23 MED ORDER — ONDANSETRON HCL 4 MG/2ML IJ SOLN
INTRAMUSCULAR | Status: DC | PRN
  Administered 2014-10-23: 4 mg via INTRAVENOUS

## 2014-10-23 MED ORDER — MIDAZOLAM HCL 2 MG/2ML IJ SOLN
INTRAMUSCULAR | Status: AC
  Filled 2014-10-23: qty 2

## 2014-10-23 SURGICAL SUPPLY — 44 items
BANDAGE ADH SHEER 1  50/CT (GAUZE/BANDAGES/DRESSINGS) ×6 IMPLANT
BLADE SURG 15 STRL LF DISP TIS (BLADE) ×1 IMPLANT
BLADE SURG 15 STRL SS (BLADE) ×2
CEMENT BONE KYPHX HV R (Orthopedic Implant) ×3 IMPLANT
CEMENT KYPHON C01A KIT/MIXER (Cement) ×3 IMPLANT
COVER MAYO STAND STRL (DRAPES) ×3 IMPLANT
CURETTE WEDGE 8.5MM KYPHX (MISCELLANEOUS) IMPLANT
DERMABOND ADVANCED (GAUZE/BANDAGES/DRESSINGS) ×2
DERMABOND ADVANCED .7 DNX12 (GAUZE/BANDAGES/DRESSINGS) ×1 IMPLANT
DRAPE C-ARM 42X72 X-RAY (DRAPES) ×3 IMPLANT
DRAPE INCISE IOBAN 66X45 STRL (DRAPES) ×3 IMPLANT
DRAPE LAPAROTOMY T 102X78X121 (DRAPES) ×3 IMPLANT
DRAPE SURG 17X23 STRL (DRAPES) ×12 IMPLANT
DURAPREP 26ML APPLICATOR (WOUND CARE) ×3 IMPLANT
GAUZE SPONGE 4X4 16PLY XRAY LF (GAUZE/BANDAGES/DRESSINGS) ×3 IMPLANT
GLOVE BIO SURGEON STRL SZ7 (GLOVE) ×3 IMPLANT
GLOVE BIO SURGEON STRL SZ8 (GLOVE) ×3 IMPLANT
GLOVE BIOGEL PI IND STRL 7.0 (GLOVE) ×2 IMPLANT
GLOVE BIOGEL PI IND STRL 8 (GLOVE) ×1 IMPLANT
GLOVE BIOGEL PI INDICATOR 7.0 (GLOVE) ×4
GLOVE BIOGEL PI INDICATOR 8 (GLOVE) ×2
GLOVE SS BIOGEL STRL SZ 7 (GLOVE) ×1 IMPLANT
GLOVE SUPERSENSE BIOGEL SZ 7 (GLOVE) ×2
GOWN STRL REUS W/ TWL LRG LVL3 (GOWN DISPOSABLE) ×2 IMPLANT
GOWN STRL REUS W/ TWL XL LVL3 (GOWN DISPOSABLE) ×1 IMPLANT
GOWN STRL REUS W/TWL LRG LVL3 (GOWN DISPOSABLE) ×4
GOWN STRL REUS W/TWL XL LVL3 (GOWN DISPOSABLE) ×2
KIT BASIN OR (CUSTOM PROCEDURE TRAY) ×3 IMPLANT
KIT ROOM TURNOVER OR (KITS) ×3 IMPLANT
NEEDLE 22X1 1/2 (OR ONLY) (NEEDLE) ×3 IMPLANT
NEEDLE HYPO 25X1 1.5 SAFETY (NEEDLE) ×3 IMPLANT
NEEDLE SPNL 18GX3.5 QUINCKE PK (NEEDLE) ×6 IMPLANT
NS IRRIG 1000ML POUR BTL (IV SOLUTION) ×3 IMPLANT
PACK SURGICAL SETUP 50X90 (CUSTOM PROCEDURE TRAY) ×3 IMPLANT
PACK UNIVERSAL I (CUSTOM PROCEDURE TRAY) ×3 IMPLANT
PAD ARMBOARD 7.5X6 YLW CONV (MISCELLANEOUS) ×6 IMPLANT
POSITIONER HEAD PRONE TRACH (MISCELLANEOUS) ×3 IMPLANT
SUT MNCRL AB 4-0 PS2 18 (SUTURE) ×3 IMPLANT
SYR BULB IRRIGATION 50ML (SYRINGE) ×3 IMPLANT
SYR CONTROL 10ML LL (SYRINGE) ×3 IMPLANT
TOWEL OR 17X24 6PK STRL BLUE (TOWEL DISPOSABLE) ×3 IMPLANT
TOWEL OR 17X26 10 PK STRL BLUE (TOWEL DISPOSABLE) ×3 IMPLANT
TRAY KYPHOPAK 15/3 ONESTEP 1ST (MISCELLANEOUS) IMPLANT
WATER STERILE IRR 1000ML POUR (IV SOLUTION) IMPLANT

## 2014-10-23 NOTE — Progress Notes (Signed)
Report given to Mary Ann RN.

## 2014-10-23 NOTE — Progress Notes (Signed)
Utilization review completed.  

## 2014-10-23 NOTE — Anesthesia Postprocedure Evaluation (Signed)
  Anesthesia Post-op Note  Patient: Carmel [place]  Procedure(s) Performed: Procedure(s) (LRB): KYPHOPLASTY (N/A)  Patient Location: PACU  Anesthesia Type: General  Level of Consciousness: awake and alert   Airway and Oxygen Therapy: Patient Spontanous Breathing  Post-op Pain: mild  Post-op Assessment: Post-op Vital signs reviewed, Patient's Cardiovascular Status Stable, Respiratory Function Stable, Patent Airway and No signs of Nausea or vomiting  Last Vitals:  Filed Vitals:   10/23/14 1343  BP:   Pulse: 57  Temp:   Resp: 10    Post-op Vital Signs: stable   Complications: No apparent anesthesia complications

## 2014-10-23 NOTE — Anesthesia Procedure Notes (Signed)
Procedure Name: Intubation Date/Time: 10/23/2014 12:02 PM Performed by: Eligha Bridegroom Pre-anesthesia Checklist: Emergency Drugs available, Timeout performed, Patient identified, Suction available and Patient being monitored Patient Re-evaluated:Patient Re-evaluated prior to inductionOxygen Delivery Method: Circle system utilized Preoxygenation: Pre-oxygenation with 100% oxygen Intubation Type: IV induction Ventilation: Mask ventilation without difficulty and Oral airway inserted - appropriate to patient size Laryngoscope Size: Mac and 4 Grade View: Grade I Tube type: Oral Tube size: 7.0 mm Number of attempts: 1 Airway Equipment and Method: Stylet and LTA kit utilized Placement Confirmation: ETT inserted through vocal cords under direct vision,  breath sounds checked- equal and bilateral and positive ETCO2 Secured at: 21 cm Tube secured with: Tape Dental Injury: Teeth and Oropharynx as per pre-operative assessment

## 2014-10-23 NOTE — Plan of Care (Signed)
Problem: Consults Goal: Diagnosis - Spinal Surgery Outcome: Completed/Met Date Met:  10/23/14 T12 KYPHOPLASTY

## 2014-10-23 NOTE — Transfer of Care (Signed)
Immediate Anesthesia Transfer of Care Note  Patient: Lori Wallace  Procedure(s) Performed: Procedure(s) with comments: KYPHOPLASTY (N/A) - T 12 kyphoplasty  Patient Location: PACU  Anesthesia Type:General  Level of Consciousness: awake, alert  and oriented  Airway & Oxygen Therapy: Patient Spontanous Breathing and Patient connected to nasal cannula oxygen  Post-op Assessment: Report given to PACU RN and Post -op Vital signs reviewed and stable  Post vital signs: Reviewed and stable  Complications: No apparent anesthesia complications

## 2014-10-23 NOTE — Anesthesia Preprocedure Evaluation (Addendum)
Anesthesia Evaluation  Patient identified by MRN, date of birth, ID band Patient awake    Reviewed: Allergy & Precautions, H&P , NPO status , Patient's Chart, lab work & pertinent test results  Airway Mallampati: II  TM Distance: >3 FB Neck ROM: Full    Dental no notable dental hx.    Pulmonary neg pulmonary ROS, former smoker,  breath sounds clear to auscultation  Pulmonary exam normal       Cardiovascular Rhythm:Regular Rate:Normal     Neuro/Psych negative neurological ROS  negative psych ROS   GI/Hepatic negative GI ROS, Neg liver ROS,   Endo/Other  negative endocrine ROS  Renal/GU negative Renal ROS  negative genitourinary   Musculoskeletal negative musculoskeletal ROS (+)   Abdominal   Peds negative pediatric ROS (+)  Hematology negative hematology ROS (+)   Anesthesia Other Findings   Reproductive/Obstetrics negative OB ROS                            Anesthesia Physical Anesthesia Plan  ASA: II  Anesthesia Plan: General   Post-op Pain Management:    Induction: Intravenous  Airway Management Planned: Oral ETT  Additional Equipment:   Intra-op Plan:   Post-operative Plan: Extubation in OR  Informed Consent: I have reviewed the patients History and Physical, chart, labs and discussed the procedure including the risks, benefits and alternatives for the proposed anesthesia with the patient or authorized representative who has indicated his/her understanding and acceptance.   Dental advisory given  Plan Discussed with: CRNA and Surgeon  Anesthesia Plan Comments:         Anesthesia Quick Evaluation

## 2014-10-24 ENCOUNTER — Encounter (HOSPITAL_COMMUNITY): Payer: Self-pay | Admitting: Orthopedic Surgery

## 2014-10-24 NOTE — Op Note (Signed)
NAMECARO, BRUNDIDGE NO.:  [id number]  MEDICAL RECORD NO.:  [id number]  LOCATION:  3C05C                        FACILITY:  [facility]  PHYSICIAN:  Phylliss Bob, MD      DATE OF BIRTH:  13-Aug-1936  DATE OF PROCEDURE:  10/23/2014                              OPERATIVE REPORT   PREOPERATIVE DIAGNOSIS:  T12 compression fracture.  POSTOPERATIVE DIAGNOSIS:  T12 compression fracture.  PROCEDURE:  T12 kyphoplasty.  SURGEON:  Phylliss Bob, MD  ASSISTANT:  None.  ANESTHESIA:  General endotracheal anesthesia.  COMPLICATIONS:  None.  DISPOSITION:  Stable.  ESTIMATED BLOOD LOSS:  Minimal.  INDICATIONS FOR SURGERY:  Briefly, Ms. Letendre is a pleasant 78 year old female who is about 6 weeks status post pain in her back.  Of note, the patient did sustain a fall on September 17, 2014.  She has been having pain ever since.  An MRI was ordered and reviewed, and was notable for an acute T12 compression fracture.  Radiographs did also support this. Given her ongoing pain, we did discuss proceeding with the surgery noted above.  The patient did elect to proceed.  The patient was fully aware of the risks and limitations of surgery.  OPERATIVE DETAILS:  On October 23, 2014, the patient was brought to surgery and general endotracheal anesthesia was administered.  The patient was placed prone on a well-padded flat Jackson bed.  Gell rolls were placed under the patient's chest and hips.  The back was prepped and draped in the usual fashion.  AP and lateral fluoroscopy were brought into the field.  I then made 2 very small incisions lateral to the T12 pedicle.  I then advanced Jamshidi needles across the T12 pedicles.  Kyphoplasty balloons were introduced bilaterally.  I was able to partially restore height of the T12 superior endplate using the balloons.  The balloons were then removed.  I then introduced approximately 6 mL of cement into the T12 vertebral body.  I did  note excellent interdigitation of the cement through the vertebral body, the majority of the cement was located immediately beneath the superior end- plate, as intended.  I was very pleased with the final appearance.  I did liberally use AP and lateral fluoroscopy.  There was no abnormal extravasation of cement into the spinal canal laterally or anteriorly. The cement was then allowed to harden.  The trocars were then removed.  The wound was irrigated.  The wound was then closed using 3-0 Monocryl.  Bacitracin and Band-Aids were applied.  The patient was then awoken from general endotracheal anesthesia and transferred to recovery in stable condition.     Phylliss Bob, MD     MD/MEDQ  D:  10/23/2014  T:  10/24/2014  Job:  [id number]

## 2014-10-24 NOTE — Progress Notes (Signed)
Patient s/p T12 kyphoplasty. Reports no pain in back or legs. Has been ambulating comfortably.  BP 137/57 mmHg  Pulse 57  Temp(Src) 97.8 F (36.6 C) (Oral)  Resp 16  Ht 5\' 11"  (1.803 m)  Wt 78.019 kg (172 lb)  BMI 24.00 kg/m2  SpO2 97%  [place] CDI  S/p kypho, doing excellent  - d/c home today, f/u 2 weeks

## 2014-10-24 NOTE — Progress Notes (Signed)
Patient alert and oriented, mae's well, voiding adequate amount of urine, swallowing without difficulty, no c/o pain. Patient discharged home with family. Script and discharged instructions given to patient. Patient and family stated understanding of d/c instructions given and has an appointment with MD. Aisha Tesa Meadors RN 

## 2014-11-12 NOTE — Discharge Summary (Signed)
Patient ID: Lori Wallace MRN: [id number] DOB/AGE: 01-08-1936 79 y.o.  Admit date: 10/23/2014 Discharge date: 10/24/2014  Admission Diagnoses:  Active Problems:   Compression fracture   Discharge Diagnoses:  Same  Past Medical History  Diagnosis Date  . Arthritis   . Cancer     breast s/p bilateral lumpectomies with radiation,  . Malignant neoplasm of upper-outer quadrant of female breast [place] wide excision, reexcision, whole breast radiation.  . 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 T1a  . Malignant neoplasm of upper-outer quadrant of female breast 2002    Right breast  . Complication of anesthesia     Pt stated her top lip was "puffed up" and felt strange for weeks after having hip surgery. Pt thinks intubation appliance may have caused it  . Hypertension     pt has not had any blood pressure medications in over 4 years  . Frequency of urination   . Constipation due to pain medication   . Trigger finger of both hands   . Environmental and seasonal allergies     Surgeries: Procedure(s): KYPHOPLASTY on 10/23/2014    Discharged Condition: Improved  Hospital Course: [name] is an 79 y.o. female who was admitted 10/23/2014 for operative treatment of Compression fracture. Patient has severe unremitting pain that affects sleep, daily activities, and work/hobbies. After pre-op clearance the patient was taken to the operating room on 10/23/2014 and underwent  Procedure(s): T12 KYPHOPLASTY.    Patient was given perioperative antibiotics:  Anti-infectives    Start     Dose/Rate Route Frequency Ordered Stop   10/23/14 2000  ceFAZolin (ANCEF) IVPB 1 g/50 mL premix     1 g100 mL/hr over 30 Minutes Intravenous Every 8 hours 10/23/14 1515 10/24/14 0451   10/23/14 0600  ceFAZolin (ANCEF) IVPB 2 g/50 mL premix     2 g100 mL/hr over 30 Minutes  Intravenous On call to O.R. 10/22/14 1342 10/23/14 1200       Patient was given sequential compression devices, early ambulation to prevent DVT.  Patient benefited maximally from hospital stay and there were no complications.    Recent vital signs: BP 148/54 mmHg  Pulse 56  Temp(Src) 97.7 F (36.5 C) (Oral)  Resp 18  Ht $R'5\' 11"'MM$  (1.803 m)  Wt 78.019 kg (172 lb)  BMI 24.00 kg/m2  SpO2 96%    Discharge Medications:     Medication List    STOP taking these medications        ibuprofen 200 MG tablet  Commonly known as:  ADVIL,MOTRIN      TAKE these medications        CALCIUM MAGNESIUM PO  Take 1 tablet by mouth daily.     cholecalciferol 1000 UNITS tablet  Commonly known as:  VITAMIN D  Take 1,000 Units by mouth daily.     PROBIOTIC DAILY PO  Take 1 tablet by mouth daily.     REFRESH OP  Place 1 drop into both eyes as needed (dry eyes).        Diagnostic Studies: Dg Chest 2 View  10/21/2014   CLINICAL DATA:  79 year old female undergoing preoperative evaluation. History of hypertension.  EXAM: CHEST  2 VIEW  COMPARISON:  Chest x-ray 07/25/2012.  FINDINGS: No acute consolidative airspace disease. No pleural effusions. No evidence of pulmonary edema. No pneumothorax. Heart size  is normal. Upper mediastinal contours are within normal limits. Atherosclerosis in the thoracic aorta. Surgical clips in the right breast and bilateral axillary regions suggestive of prior right breast lumpectomy and bilateral axillary a lymph node dissection. Lateral projection demonstrates a compression fracture of a lower thoracic vertebral body (likely T12) with approximately 50% loss of anterior vertebral body height, which is new compared to prior study 07/25/2012. On the frontal projection, there may be some associated paravertebral soft tissue swelling adjacent to T12.  IMPRESSION: 1. No radiographic evidence of acute cardiopulmonary disease. 2. New compression fracture of T12 with  approximately 50% loss of anterior vertebral body height when compared to prior study 07/25/2012. Although this is new, this is age indeterminate by radiographs, however, there does appear to be some paravertebral soft tissue swelling, which could suggest an acute or early subacute injury. This could be further evaluated with CT or MRI if clinically appropriate.   Electronically Signed   By: Vinnie Langton M.D.   On: 10/21/2014 16:31   Dg Thoracolumabar Spine  10/23/2014   CLINICAL DATA:  Kyphoplasty.  EXAM: DG C-ARM 61-120 MIN; THORACOLUMBAR SPINE - 2 VIEW  FLUOROSCOPY TIME:  2 min 22 seconds  COMPARISON:  Chest x-ray 10/21/2014.  FINDINGS: Lower thoracic vertebral body kyphoplasty. Methylmethacrylate appears to be within the treated vertebral body.  IMPRESSION: Lower thoracic vertebral body kyphoplasty.   Electronically Signed   By: Marcello Moores  Register   On: 10/23/2014 14:26   Dg C-arm 1-60 Min  10/23/2014   CLINICAL DATA:  Kyphoplasty.  EXAM: DG C-ARM 61-120 MIN; THORACOLUMBAR SPINE - 2 VIEW  FLUOROSCOPY TIME:  2 min 22 seconds  COMPARISON:  Chest x-ray 10/21/2014.  FINDINGS: Lower thoracic vertebral body kyphoplasty. Methylmethacrylate appears to be within the treated vertebral body.  IMPRESSION: Lower thoracic vertebral body kyphoplasty.   Electronically Signed   By: Marcello Moores  Register   On: 10/23/2014 14:26    Disposition: 01-Home or Self Care   1 Day PO T12 Kyphoplasty -Written scripts for pain signed and in chart -D/C instructions sheet printed and in chart -D/C today  -F/U in office 2 weeks   Signed: Justice Britain 11/12/2014, 1:59 PM

## 2014-11-25 ENCOUNTER — Ambulatory Visit: Payer: Self-pay | Admitting: Internal Medicine

## 2014-12-08 ENCOUNTER — Ambulatory Visit: Payer: Medicare PPO | Admitting: General Surgery

## 2014-12-10 ENCOUNTER — Ambulatory Visit: Payer: Medicare PPO | Admitting: General Surgery

## 2014-12-22 ENCOUNTER — Encounter: Payer: Self-pay | Admitting: General Surgery

## 2014-12-22 ENCOUNTER — Ambulatory Visit (INDEPENDENT_AMBULATORY_CARE_PROVIDER_SITE_OTHER): Payer: Medicare PPO | Admitting: General Surgery

## 2014-12-22 VITALS — BP 150/72 | HR 86 | Resp 14 | Ht 71.0 in | Wt 173.0 lb

## 2014-12-22 DIAGNOSIS — C50919 Malignant neoplasm of unspecified site of unspecified female breast: Secondary | ICD-10-CM

## 2014-12-22 NOTE — Patient Instructions (Signed)
The patient has been asked to return to the office in one year with a bilateral diagnostic mammogram. 

## 2014-12-22 NOTE — Progress Notes (Signed)
Patient ID: Lori Wallace, female   DOB: [birth year], 79 y.o.   MRN: [id number]  Chief Complaint  Patient presents with  . Follow-up    mammogram    HPI Lori Wallace is a 79 y.o. female who presents for a breast evaluation. The most recent mammogram was done on1/19/16.  Patient does perform regular self breast checks and gets regular mammograms done.    HPI  Past Medical History  Diagnosis Date  . Arthritis   . Cancer     breast s/p bilateral lumpectomies with radiation,  . Malignant neoplasm of upper-outer quadrant of female breast [place] wide excision, reexcision, whole breast radiation.  . 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 T1a  . Malignant neoplasm of upper-outer quadrant of female breast 2002    Right breast  . Complication of anesthesia     Pt stated her top lip was "puffed up" and felt strange for weeks after having hip surgery. Pt thinks intubation appliance may have caused it  . Hypertension     pt has not had any blood pressure medications in over 4 years  . Frequency of urination   . Constipation due to pain medication   . Trigger finger of both hands   . Environmental and seasonal allergies     Past Surgical History  Procedure Laterality Date  . Back surgery  1960s    lumbar ruptured discs  . Eye surgery  1990s    bilat cataract removal  . Uterine tumor excision      benign  . Total hip arthroplasty  07/30/2012    Procedure: TOTAL HIP ARTHROPLASTY;  Surgeon: Kerin Salen, MD;  Location: [place];  Service: Orthopedics;  Laterality: Left;  . Joint replacement      R knee w/prior arhtroscopy  . Fracture surgery      L wrist repair, R lower leg  . Wrist surgery Left   . Kyphoplasty N/A 10/23/2014    Procedure: KYPHOPLASTY;  Surgeon: Sinclair Ship, MD;  Location: [place];  Service: Orthopedics;  Laterality: N/A;  T 12 kyphoplasty   . Colonoscopy  2006    Dr. Vira Agar  . Breast surgery Bilateral     lumpectomies x 3    No family history on file.  Social History History  Substance Use Topics  . Smoking status: Former Research scientist (life sciences)  . Smokeless tobacco: Never Used  . Alcohol Use: No    Allergies  Allergen Reactions  . Carrot [Daucus Carota] Itching  . Demerol [Meperidine] Nausea And Vomiting  . Peanut-Containing Drug Products Itching    Current Outpatient Prescriptions  Medication Sig Dispense Refill  . Calcium-Magnesium-Vitamin D (CALCIUM MAGNESIUM PO) Take 1 tablet by mouth daily.    . cholecalciferol (VITAMIN D) 1000 UNITS tablet Take 1,000 Units by mouth daily.    . Polyvinyl Alcohol-Povidone (REFRESH OP) Place 1 drop into both eyes as needed (dry eyes).     . Probiotic Product (PROBIOTIC DAILY PO) Take 1 tablet by mouth daily.     No current facility-administered medications for this visit.    Review of Systems Review of Systems  Constitutional: Negative.   Respiratory: Negative.   Cardiovascular: Negative.     Blood pressure 150/72, pulse 86, resp. rate 14, height $RemoveBe'5\' 11"'OYQWvxTiA$  (1.803 m), weight 173 lb (78.472 kg).  Physical Exam Physical Exam  Constitutional: She is  oriented to person, place, and time. She appears well-developed and well-nourished.  Eyes: Conjunctivae are normal. No scleral icterus.  Neck: Neck supple.  Cardiovascular: Normal rate, regular rhythm and normal heart sounds.   Pulmonary/Chest: Effort normal and breath sounds normal. Right breast exhibits no inverted nipple, no mass, no nipple discharge, no skin change and no tenderness. Left breast exhibits no inverted nipple, no mass, no nipple discharge, no skin change and no tenderness.  Abdominal: Soft. Bowel sounds are normal. There is no tenderness.  Lymphadenopathy:    She has no cervical adenopathy.    She has no axillary adenopathy.  Neurological: She is alert and oriented to person, place, and time.  Skin: Skin is warm and  dry.    Data Reviewed Bilateral mammograms dated 11/25/2014 were reviewed. Postsurgical changes are identified. BI-RADS-2.  Assessment    Benign breast exam.    Plan    The patient has been asked to return to the office in one year with a bilateral diagnostic mammogram.   The patient has been in contact with Dr. Percell [place] office regarding a follow-up colonoscopy. She was encouraged to proceed due to her slight increased risk of colon cancer based on her past history of breast cancer.     PCP:  Despina Arias 12/22/2014, 2:19 PM

## 2015-09-24 ENCOUNTER — Other Ambulatory Visit: Payer: Self-pay | Admitting: *Deleted

## 2015-09-24 DIAGNOSIS — Z853 Personal history of malignant neoplasm of breast: Secondary | ICD-10-CM

## 2015-10-08 ENCOUNTER — Encounter: Payer: Self-pay | Admitting: *Deleted

## 2015-12-02 ENCOUNTER — Ambulatory Visit: Payer: BC Managed Care – PPO | Attending: General Surgery

## 2015-12-02 ENCOUNTER — Other Ambulatory Visit: Payer: BC Managed Care – PPO

## 2015-12-08 ENCOUNTER — Ambulatory Visit: Payer: Medicare PPO | Admitting: General Surgery

## 2015-12-29 ENCOUNTER — Ambulatory Visit
Admission: RE | Admit: 2015-12-29 | Discharge: 2015-12-29 | Disposition: A | Discharge status: Home / Self Care | Payer: Medicare Other | Source: Ambulatory Visit | Attending: General Surgery | Admitting: General Surgery

## 2015-12-29 ENCOUNTER — Other Ambulatory Visit: Payer: Self-pay | Admitting: General Surgery

## 2015-12-29 DIAGNOSIS — Z853 Personal history of malignant neoplasm of breast: Secondary | ICD-10-CM

## 2015-12-29 DIAGNOSIS — Z9889 Other specified postprocedural states: Secondary | ICD-10-CM | POA: Diagnosis not present

## 2015-12-29 HISTORY — DX: Malignant neoplasm of unspecified site of unspecified female breast: C50.919

## 2016-01-05 ENCOUNTER — Encounter: Payer: Self-pay | Admitting: General Surgery

## 2016-01-05 ENCOUNTER — Other Ambulatory Visit: Payer: Self-pay

## 2016-01-05 ENCOUNTER — Ambulatory Visit (INDEPENDENT_AMBULATORY_CARE_PROVIDER_SITE_OTHER): Payer: Medicare Other | Admitting: General Surgery

## 2016-01-05 VITALS — BP 160/74 | HR 82 | Resp 20 | Ht 71.0 in | Wt 171.0 lb

## 2016-01-05 DIAGNOSIS — C50919 Malignant neoplasm of unspecified site of unspecified female breast: Secondary | ICD-10-CM | POA: Diagnosis not present

## 2016-01-05 DIAGNOSIS — N63 Unspecified lump in breast: Secondary | ICD-10-CM

## 2016-01-05 DIAGNOSIS — I10 Essential (primary) hypertension: Secondary | ICD-10-CM | POA: Diagnosis not present

## 2016-01-05 DIAGNOSIS — N6320 Unspecified lump in the left breast, unspecified quadrant: Secondary | ICD-10-CM

## 2016-01-05 NOTE — Progress Notes (Signed)
Patient ID: Lori Wallace, female   DOB: July 03, 1936, 80 y.o.   MRN: [id number]  Chief Complaint  Patient presents with  . Follow-up    mammogram    HPI Lori Wallace is a 80 y.o. female who presents for a breast evaluation. The most recent mammogram was done on 12/29/15.  Patient does perform regular self breast checks and gets regular mammograms done.  Patient states no new breast changes. She fell about 6 weeks ago on her right side and bruised near her right breast, doing fine at present. Her back does still bother her on occasion.   She decided against having a colonoscopy by Dr. Vira Agar.   HPI  Past Medical History  Diagnosis Date  . Arthritis   . Cancer [facility])     breast s/p bilateral lumpectomies with radiation,  . Malignant neoplasm of upper-outer quadrant of female breast ([place]) [place] wide excision, reexcision, whole breast radiation.  . 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 T1a  . Malignant neoplasm of upper-outer quadrant of female breast [facility]) 2002    Right breast  . Complication of anesthesia     Pt stated her top lip was "puffed up" and felt strange for weeks after having hip surgery. Pt thinks intubation appliance may have caused it  . Hypertension     pt has not had any blood pressure medications in over 4 years  . Frequency of urination   . Constipation due to pain medication   . Trigger finger of both hands   . Environmental and seasonal allergies   . Breast cancer ([place]) [phone]    Past Surgical History  Procedure Laterality Date  . Back surgery  1960s    lumbar ruptured discs  . Eye surgery  1990s    bilat cataract removal  . Uterine tumor excision      benign  . Total hip arthroplasty  07/30/2012    Procedure: TOTAL HIP ARTHROPLASTY;  Surgeon: Kerin Salen, MD;  Location: [place];  Service: Orthopedics;  Laterality:  Left;  . Joint replacement      R knee w/prior arhtroscopy  . Fracture surgery      L wrist repair, R lower leg  . Wrist surgery Left   . Kyphoplasty N/A 10/23/2014    Procedure: KYPHOPLASTY;  Surgeon: Sinclair Ship, MD;  Location: [place];  Service: Orthopedics;  Laterality: N/A;  T 12 kyphoplasty  . Colonoscopy  2006    Dr. Vira Agar  . Breast surgery Bilateral     lumpectomies x 3  . Breast lumpectomy Left 1995 with Radiation  . Breast lumpectomy Right 2002    with Radiation  . Breast lumpectomy Left 2011    Family History  Problem Relation Age of Onset  . Breast cancer Neg Hx     Social History Social History  Substance Use Topics  . Smoking status: Former Research scientist (life sciences)  . Smokeless tobacco: Never Used  . Alcohol Use: No    Allergies  Allergen Reactions  . Carrot [Daucus Carota] Itching  . Demerol [Meperidine] Nausea And Vomiting  . Peanut-Containing Drug Products Itching    Current Outpatient Prescriptions  Medication Sig Dispense Refill  . Calcium-Magnesium-Vitamin D (CALCIUM MAGNESIUM PO) Take 1 tablet by mouth daily.    . cholecalciferol (VITAMIN D) 1000 UNITS tablet Take 1,000 Units by mouth daily.    Marland Kitchen  meloxicam (MOBIC) 7.5 MG tablet Take 7.5 mg by mouth as needed for pain.    . Omega 3 1000 MG CAPS Take by mouth.    . Polyvinyl Alcohol-Povidone (REFRESH OP) Place 1 drop into both eyes as needed (dry eyes).     . Probiotic Product (PROBIOTIC DAILY PO) Take 1 tablet by mouth daily.     No current facility-administered medications for this visit.    Review of Systems Review of Systems  Constitutional: Negative.   Respiratory: Negative.   Cardiovascular: Negative.     Blood pressure 160/74, pulse 82, resp. rate 20, height '5\' 11"'$  (1.803 m), weight 171 lb (77.565 kg).  Physical Exam Physical Exam  Constitutional: She is oriented to person, place, and time. She appears well-developed and well-nourished.  HENT:  Mouth/Throat: Oropharynx is clear and  moist.  Eyes: Conjunctivae are normal. No scleral icterus.  Neck: Neck supple.  Cardiovascular: Normal rate, regular rhythm and normal heart sounds.   Pulmonary/Chest: Effort normal and breath sounds normal. Right breast exhibits no inverted nipple, no mass, no nipple discharge, no skin change and no tenderness. Left breast exhibits no inverted nipple, no mass, no nipple discharge, no skin change and no tenderness.    Thickening mid portion right outer portion of scar right breast. Dimpling at 12 o'clock along scar left breast.   Lymphadenopathy:    She has no cervical adenopathy.    She has no axillary adenopathy.  Neurological: She is alert and oriented to person, place, and time.  Skin: Skin is warm and dry.  Psychiatric: Her behavior is normal.    Data Reviewed Bilateral mammograms dated 12/29/2015 were reported unchanged. BI-RADS-2.  Independent review showed a focal area of thickening that has showed [place] change over the last 3 imaging studies. In 2015 this area measured 0.6 x 0.78 x 0.93 cm, and 2016 and measured 0.6 x 0.7 x 0.8 cm and presently measures 0.75 x 0.79 x 0.97 cm.  Ultrasound examination of the area of palpable containing corresponding to the mammographic abnormality shows a dense shadowing mass that is tall and wide measuring 0.5 x 0.58 x 0.6 cm. This is the 12:00 in the midportion the 1995 wide excision scar. No associated calcifications are noted in the mammogram correlating to this area. BI-RADS-4.   Assessment    New palpable thickening at the site of the 1995 wide excision site without associated calcification on mammogram, suspicious ultrasound.  Essential hypertension with elevation 174/92 on recheck during today's visit.      Plan    The patient wanted to defer biopsy until next month. This will be scheduled at a convenient time.  The patient will continue careful follow-up with her primary physician in regards to her elevated blood pressure.        Follow up in one month..   This information has been scribed by Karie Fetch RNBC.   PCP: Dr. Glendon Axe   Robert Bellow 01/06/2016, 7:22 PM

## 2016-01-05 NOTE — Patient Instructions (Signed)
The patient is aware to call back for any questions or concerns.  

## 2016-01-06 DIAGNOSIS — C50919 Malignant neoplasm of unspecified site of unspecified female breast: Secondary | ICD-10-CM | POA: Insufficient documentation

## 2016-01-06 DIAGNOSIS — I10 Essential (primary) hypertension: Secondary | ICD-10-CM | POA: Insufficient documentation

## 2016-01-06 DIAGNOSIS — N6320 Unspecified lump in the left breast, unspecified quadrant: Secondary | ICD-10-CM | POA: Insufficient documentation

## 2016-02-01 ENCOUNTER — Encounter: Payer: Self-pay | Admitting: General Surgery

## 2016-02-01 ENCOUNTER — Ambulatory Visit (INDEPENDENT_AMBULATORY_CARE_PROVIDER_SITE_OTHER): Payer: Medicare Other | Admitting: General Surgery

## 2016-02-01 VITALS — BP 138/78 | HR 80 | Resp 12 | Ht 71.0 in | Wt 169.0 lb

## 2016-02-01 DIAGNOSIS — N63 Unspecified lump in breast: Secondary | ICD-10-CM | POA: Diagnosis not present

## 2016-02-01 DIAGNOSIS — N6320 Unspecified lump in the left breast, unspecified quadrant: Secondary | ICD-10-CM

## 2016-02-01 NOTE — Progress Notes (Signed)
Patient ID: Lori Wallace, female   DOB: 26-Jan-1936, 80 y.o.   MRN: [id number]  Chief Complaint  Patient presents with  . Follow-up    left breast mass    HPI Lori Wallace is a 80 y.o. female here today for her follow up left breast mass  HPI  Past Medical History  Diagnosis Date  . Arthritis   . Cancer [facility])     breast s/p bilateral lumpectomies with radiation,  . Malignant neoplasm of upper-outer quadrant of female breast ([place]) [place] wide excision, reexcision, whole breast radiation.  . 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 T1a  . Malignant neoplasm of upper-outer quadrant of female breast [facility]) 2002    Right breast  . Complication of anesthesia     Pt stated her top lip was "puffed up" and felt strange for weeks after having hip surgery. Pt thinks intubation appliance may have caused it  . Hypertension     pt has not had any blood pressure medications in over 4 years  . Frequency of urination   . Constipation due to pain medication   . Trigger finger of both hands   . Environmental and seasonal allergies   . Breast cancer ([place]) [phone]    Past Surgical History  Procedure Laterality Date  . Back surgery  1960s    lumbar ruptured discs  . Eye surgery  1990s    bilat cataract removal  . Uterine tumor excision      benign  . Total hip arthroplasty  07/30/2012    Procedure: TOTAL HIP ARTHROPLASTY;  Surgeon: Kerin Salen, MD;  Location: [place];  Service: Orthopedics;  Laterality: Left;  . Joint replacement      R knee w/prior arhtroscopy  . Fracture surgery      L wrist repair, R lower leg  . Wrist surgery Left   . Kyphoplasty N/A 10/23/2014    Procedure: KYPHOPLASTY;  Surgeon: Sinclair Ship, MD;  Location: [place];  Service: Orthopedics;  Laterality: N/A;  T 12 kyphoplasty  . Colonoscopy  2006    Dr. Vira Agar  . Breast  surgery Bilateral     lumpectomies x 3  . Breast lumpectomy Left 1995 with Radiation  . Breast lumpectomy Right 2002    with Radiation  . Breast lumpectomy Left 2011    Family History  Problem Relation Age of Onset  . Breast cancer Neg Hx     Social History Social History  Substance Use Topics  . Smoking status: Former Research scientist (life sciences)  . Smokeless tobacco: Never Used  . Alcohol Use: No    Allergies  Allergen Reactions  . Carrot [Daucus Carota] Itching  . Demerol [Meperidine] Nausea And Vomiting  . Peanut-Containing Drug Products Itching    Current Outpatient Prescriptions  Medication Sig Dispense Refill  . Calcium-Magnesium-Vitamin D (CALCIUM MAGNESIUM PO) Take 1 tablet by mouth daily.    . cholecalciferol (VITAMIN D) 1000 UNITS tablet Take 1,000 Units by mouth daily.    . meloxicam (MOBIC) 7.5 MG tablet Take 7.5 mg by mouth as needed for pain.    . Omega 3 1000 MG CAPS Take by mouth.    . Polyvinyl Alcohol-Povidone (REFRESH OP) Place 1 drop into both eyes as needed (dry eyes).     . Probiotic Product (PROBIOTIC DAILY PO) Take 1 tablet by mouth daily.  No current facility-administered medications for this visit.    Review of Systems Review of Systems  Constitutional: Negative.   Respiratory: Negative.   Cardiovascular: Negative.     Blood pressure 138/78, pulse 80, resp. rate 12, height '5\' 11"'$  (1.803 m), weight 169 lb (76.658 kg).  Physical Exam Physical Exam  Constitutional: She is oriented to person, place, and time. She appears well-developed and well-nourished.  Eyes: Conjunctivae and EOM are normal.  Cardiovascular: Normal rate, regular rhythm and normal heart sounds.   Pulmonary/Chest: Effort normal and breath sounds normal.    Neurological: She is alert and oriented to person, place, and time.  Skin: Skin is warm and dry.      Assessment    Palpable mass within the left breast wide excision site, right palpation and ultrasound most likely fat necrosis  although unusual to present this late.    Plan    The patient originally said that she wasn't an have a biopsy, and then when I explained that this would be a "punch" biopsy rather than a core biopsy which she is found uncomfortable in the past she was amenable to proceed.  10 mL of 0.5% Xylocaine with 0.25% Marcaine with 1 200,000 of epinephrine was utilized well tolerated. ChloraPrep was applied to the skin. A 2.5 mm punch biopsy was obtained. The hard granular tissue below the area that corresponded with the palpable thickening which sharply excised. This was sent for routine histology. The appearance is that of fat necrosis. Palpation showed no residual firm tissue after excision. The skin defect was closed with 4-0 nylon sutures 2. Telfa and Tegaderm dressing applied.  The patient will return in one week for suture removal. She'll be contacted by phone when pathology results are available.     PCP:  Candiss Norse, This information has been scribed by Gaspar Cola CMA.   [facility] 02/01/2016, 2:33 PM

## 2016-02-01 NOTE — Patient Instructions (Addendum)
1 week nurse  

## 2016-02-04 ENCOUNTER — Telehealth: Payer: Self-pay | Admitting: *Deleted

## 2016-02-04 NOTE — Telephone Encounter (Signed)
-----   Message from Robert Bellow, MD sent at 02/04/2016 12:14 PM EDT ----- Please notify the patient that the pathology was fine: Old scar tissue. Thanks.  ----- Message -----    From: Lab in [facility] [place] Interface    Sent: 02/04/2016  11:55 AM      To: Robert Bellow, MD

## 2016-02-04 NOTE — Telephone Encounter (Signed)
Spoke to patient on 02/04/16 at 4:45 pm and told her biopsy result was benign.

## 2016-02-09 ENCOUNTER — Ambulatory Visit (INDEPENDENT_AMBULATORY_CARE_PROVIDER_SITE_OTHER): Payer: Medicare Other | Admitting: *Deleted

## 2016-02-09 DIAGNOSIS — N63 Unspecified lump in breast: Secondary | ICD-10-CM

## 2016-02-09 DIAGNOSIS — N6320 Unspecified lump in the left breast, unspecified quadrant: Secondary | ICD-10-CM

## 2016-02-09 NOTE — Progress Notes (Signed)
Patient came in today for a wound check left breast excision.  The wound is clean, with no signs of infection noted. Follow up as scheduled. The sutures were removed.

## 2016-06-29 ENCOUNTER — Ambulatory Visit
Admission: RE | Admit: 2016-06-29 | Discharge: 2016-06-29 | Disposition: A | Discharge status: Home / Self Care | Payer: Medicare Other | Source: Ambulatory Visit | Attending: Internal Medicine | Admitting: Internal Medicine

## 2016-06-29 ENCOUNTER — Other Ambulatory Visit: Payer: Self-pay | Admitting: Internal Medicine

## 2016-06-29 DIAGNOSIS — R42 Dizziness and giddiness: Secondary | ICD-10-CM

## 2016-06-29 DIAGNOSIS — I6782 Cerebral ischemia: Secondary | ICD-10-CM | POA: Insufficient documentation

## 2016-06-29 DIAGNOSIS — W19XXXA Unspecified fall, initial encounter: Secondary | ICD-10-CM

## 2016-06-29 DIAGNOSIS — Y92009 Unspecified place in unspecified non-institutional (private) residence as the place of occurrence of the external cause: Secondary | ICD-10-CM

## 2016-06-29 DIAGNOSIS — W1830XA Fall on same level, unspecified, initial encounter: Secondary | ICD-10-CM | POA: Diagnosis not present

## 2016-07-15 IMAGING — CR DG ABDOMEN 2V
1 series · 3 of 3 positions shown · non-contrast
Comparison: CT abdomen and pelvis of 09/01/2004

CLINICAL DATA: Fell [REDACTED], pain, no bowel movement

EXAM:
ABDOMEN - 2 VIEW

[Series 1: dxr abdomen 2 v flat and erect · 0.14mm/px · 3 of 3 slices shown]
[im 1/3]
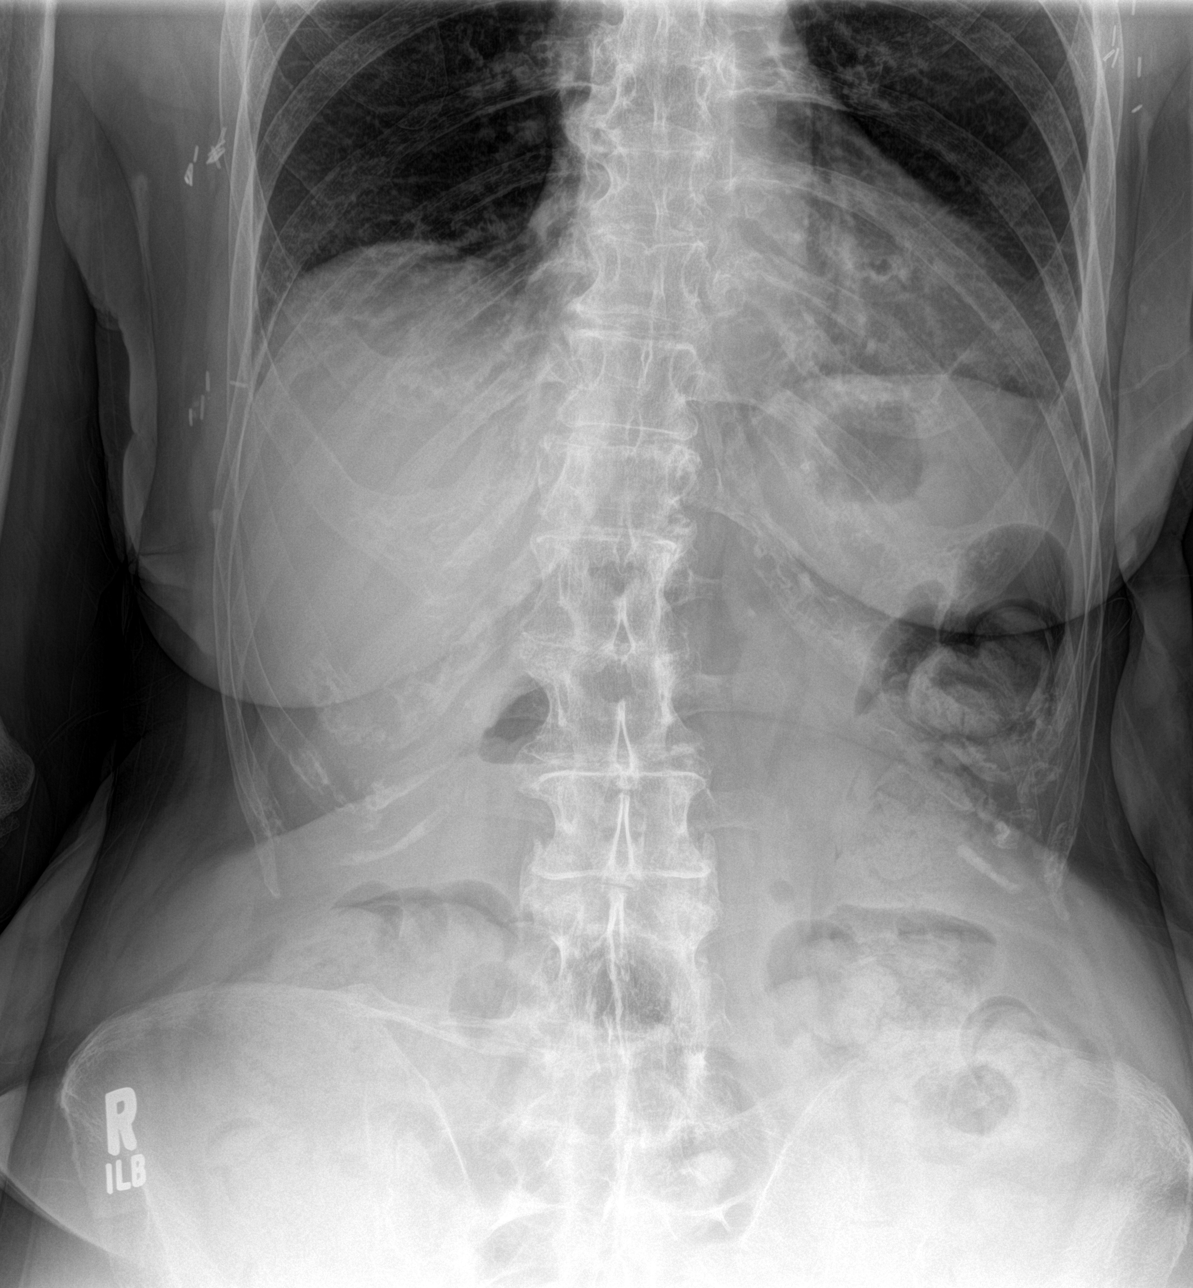
[im 2/3]
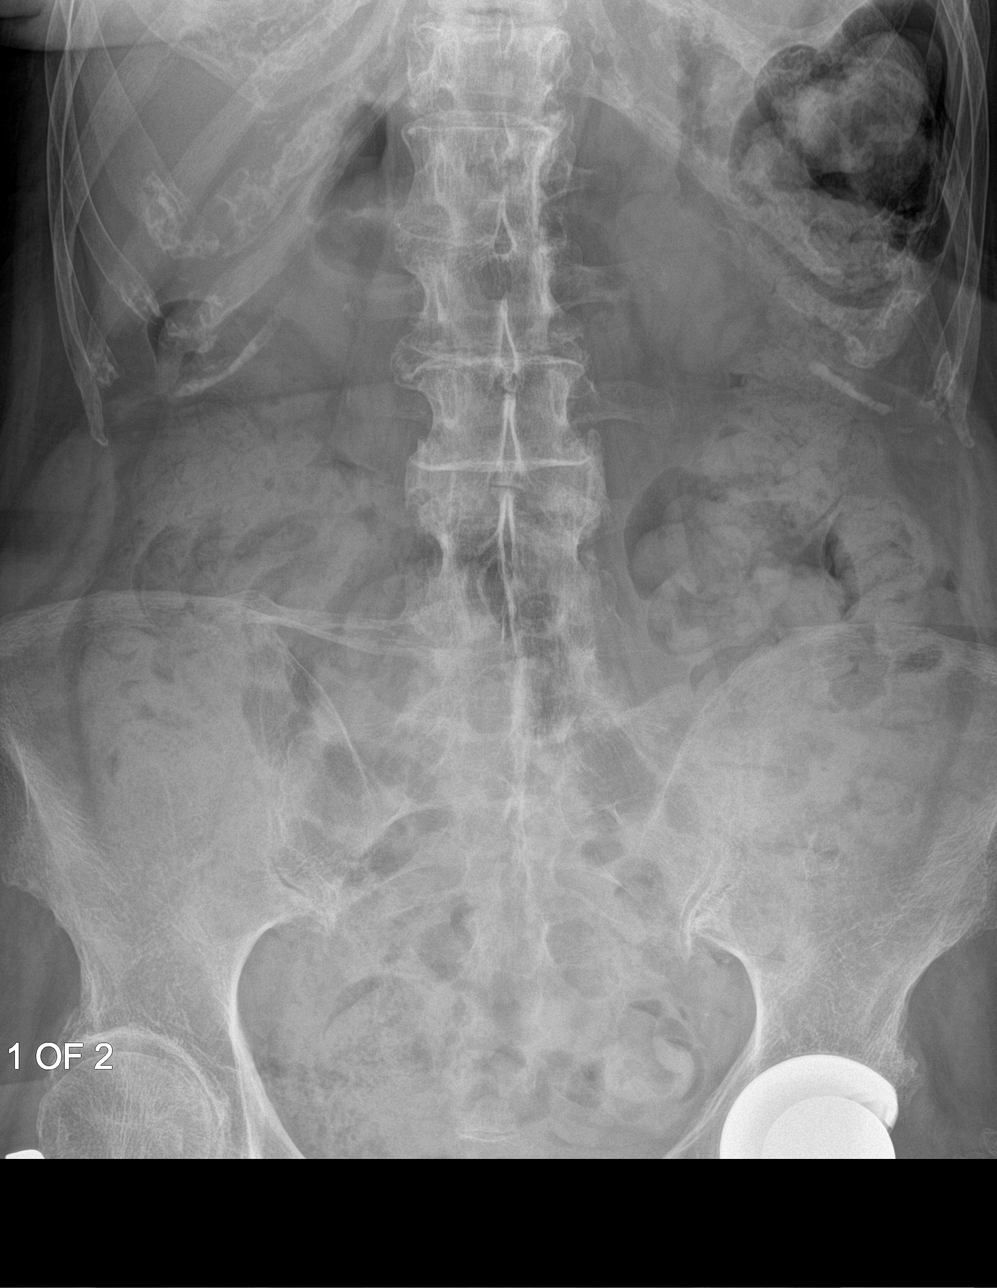
[im 3/3]
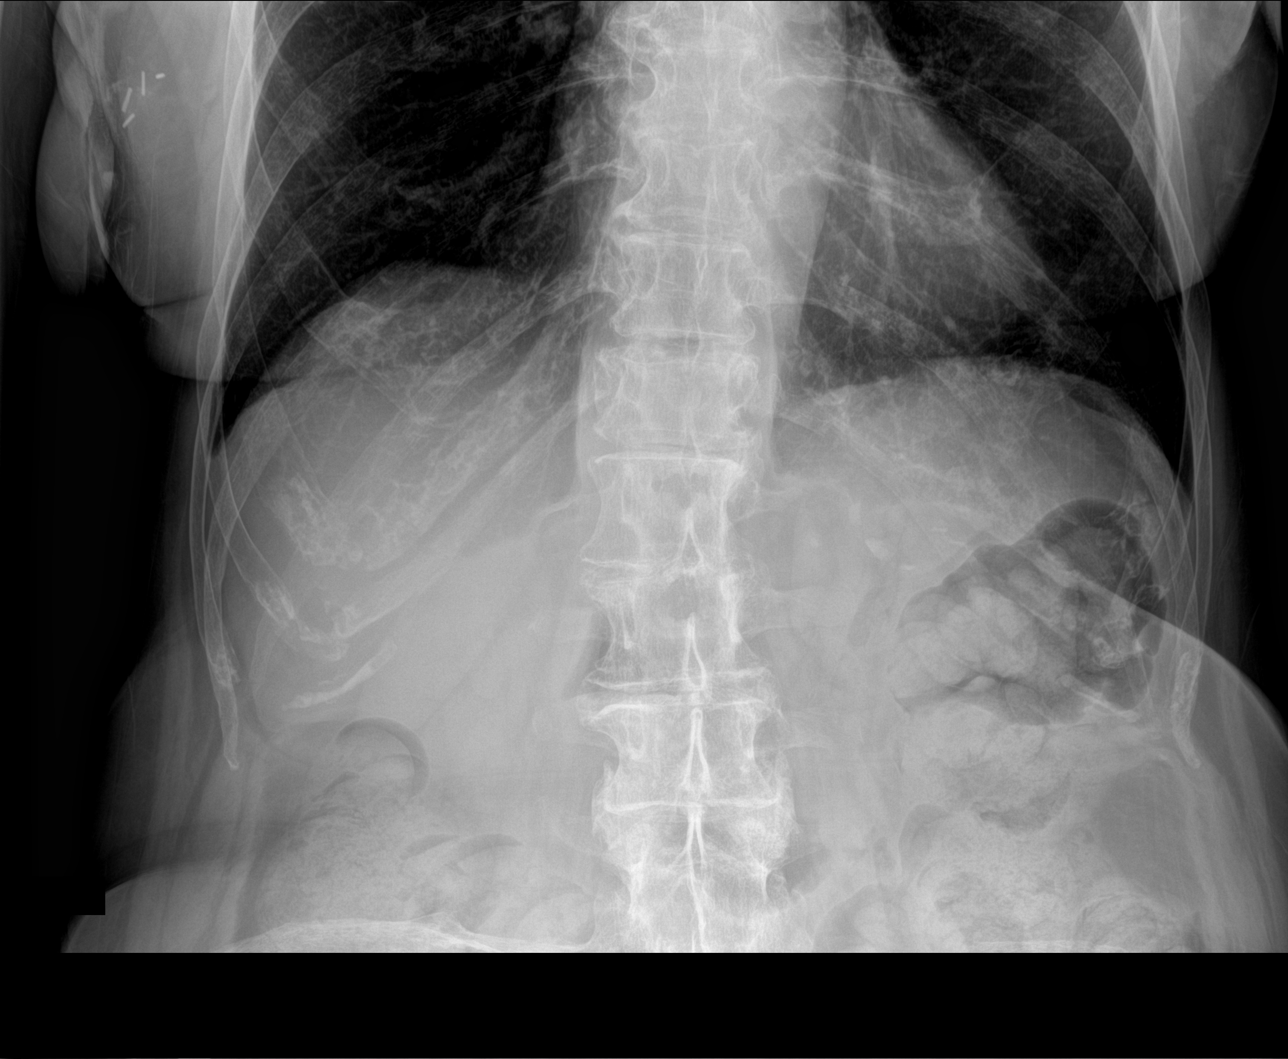

[3 of 3 positions shown; findings below may reference images not displayed]

FINDINGS: The bones are diffusely osteopenic. No acute fracture is seen. The
left total hip replacement is noted. There is a moderate to large
amount of feces throughout the colon. No bowel obstruction is seen.
No free air is noted on the erect view. Surgical clips overlie the
lower chest bilaterally.
IMPRESSION: Moderate to large amount of feces throughout the colon. No bowel
obstruction. Osteopenia.

## 2016-08-13 IMAGING — RF DG THORACOLUMBAR SPINE 2V
1 series · 1 of 1 positions shown · non-contrast
Comparison: Chest x-ray 10/21/2014.

CLINICAL DATA: Kyphoplasty.

EXAM:
DG C-ARM 61-120 MIN; THORACOLUMBAR SPINE - 2 VIEW
FLUOROSCOPY TIME:  2 min 22 seconds

[Series 1: run · 1 of 1 slices shown]
[im 1/1]
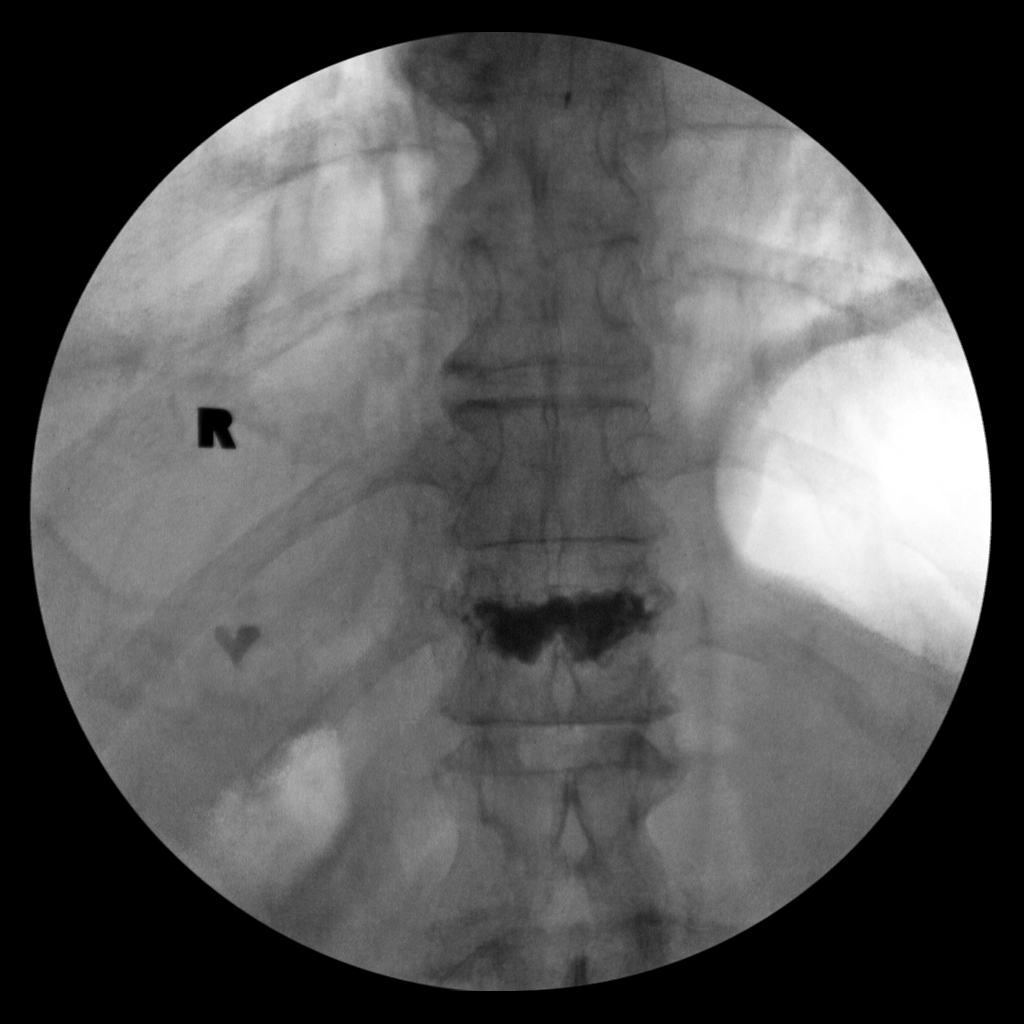

[1 of 1 positions shown; findings below may reference images not displayed]

FINDINGS: Lower thoracic vertebral body kyphoplasty. Methylmethacrylate
appears to be within the treated vertebral body.
IMPRESSION: Lower thoracic vertebral body kyphoplasty.

## 2016-12-13 ENCOUNTER — Other Ambulatory Visit: Payer: Self-pay | Admitting: Internal Medicine

## 2016-12-13 DIAGNOSIS — Z853 Personal history of malignant neoplasm of breast: Secondary | ICD-10-CM

## 2016-12-29 ENCOUNTER — Ambulatory Visit: Payer: Medicare Other

## 2017-01-24 ENCOUNTER — Ambulatory Visit
Admission: RE | Admit: 2017-01-24 | Discharge: 2017-01-24 | Disposition: A | Discharge status: Home / Self Care | Payer: Medicare Other | Source: Ambulatory Visit | Attending: Internal Medicine | Admitting: Internal Medicine

## 2017-01-24 ENCOUNTER — Ambulatory Visit: Payer: BC Managed Care – PPO

## 2017-01-24 DIAGNOSIS — Z853 Personal history of malignant neoplasm of breast: Secondary | ICD-10-CM | POA: Diagnosis not present

## 2018-03-02 ENCOUNTER — Other Ambulatory Visit: Payer: Self-pay | Admitting: Internal Medicine

## 2018-03-06 ENCOUNTER — Other Ambulatory Visit: Payer: Self-pay | Admitting: Internal Medicine

## 2018-03-06 DIAGNOSIS — Z1231 Encounter for screening mammogram for malignant neoplasm of breast: Secondary | ICD-10-CM

## 2018-03-22 ENCOUNTER — Ambulatory Visit
Admission: RE | Admit: 2018-03-22 | Discharge: 2018-03-22 | Disposition: A | Discharge status: Home / Self Care | Payer: Medicare Other | Source: Ambulatory Visit | Attending: Internal Medicine | Admitting: Internal Medicine

## 2018-03-22 DIAGNOSIS — Z1231 Encounter for screening mammogram for malignant neoplasm of breast: Secondary | ICD-10-CM | POA: Diagnosis present

## 2018-04-03 DIAGNOSIS — I10 Essential (primary) hypertension: Secondary | ICD-10-CM | POA: Insufficient documentation

## 2019-07-05 ENCOUNTER — Other Ambulatory Visit: Payer: Self-pay | Admitting: Family Medicine

## 2019-07-05 DIAGNOSIS — Z1231 Encounter for screening mammogram for malignant neoplasm of breast: Secondary | ICD-10-CM

## 2019-07-05 DIAGNOSIS — Z853 Personal history of malignant neoplasm of breast: Secondary | ICD-10-CM

## 2019-07-12 ENCOUNTER — Ambulatory Visit
Admission: RE | Admit: 2019-07-12 | Discharge: 2019-07-12 | Disposition: A | Discharge status: Home / Self Care | Payer: Medicare Other | Source: Ambulatory Visit | Attending: Family Medicine | Admitting: Family Medicine

## 2019-07-12 DIAGNOSIS — Z853 Personal history of malignant neoplasm of breast: Secondary | ICD-10-CM | POA: Insufficient documentation

## 2020-02-18 ENCOUNTER — Other Ambulatory Visit: Payer: Self-pay

## 2020-02-18 ENCOUNTER — Ambulatory Visit: Payer: Medicare PPO | Admitting: Dermatology

## 2020-02-18 DIAGNOSIS — B078 Other viral warts: Secondary | ICD-10-CM | POA: Diagnosis not present

## 2020-02-18 DIAGNOSIS — C44722 Squamous cell carcinoma of skin of right lower limb, including hip: Secondary | ICD-10-CM

## 2020-02-18 DIAGNOSIS — L578 Other skin changes due to chronic exposure to nonionizing radiation: Secondary | ICD-10-CM | POA: Diagnosis not present

## 2020-02-18 DIAGNOSIS — L820 Inflamed seborrheic keratosis: Secondary | ICD-10-CM | POA: Diagnosis not present

## 2020-02-18 DIAGNOSIS — D485 Neoplasm of uncertain behavior of skin: Secondary | ICD-10-CM

## 2020-02-18 NOTE — Progress Notes (Signed)
   Follow-Up Visit   Subjective  Lori Wallace is a 84 y.o. female who presents for the following: Follow-up (Recheck SCC R pretibia, treated 09/03/2019 with EDC. Patient advises it feels rough and is sometimes tender. ).  Also treat a wart on the right plantar ball of foot. Treated in the past with LN2 and Mediplast pads. Check spot on left upper arm.    The following portions of the chart were reviewed this encounter and updated as appropriate:     Review of Systems: No other skin or systemic complaints.  Objective  Well appearing patient in no apparent distress; mood and affect are within normal limits.  A focused examination was performed including arms, legs, feet. Relevant physical exam findings are noted in the Assessment and Plan.   Actinic Damage - diffuse scaly erythematous macules with underlying dyspigmentation - Recommend daily broad spectrum sunscreen SPF 30+ to sun-exposed areas, reapply every 2 hours as needed.  - Call for new or changing lesions.   Objective  Left Upper Arm x 1: Erythematous keratotic or waxy stuck-on papule or plaque.   Right upper med pretibia x 1: Pink slightly pearly patch 1.3cm  Objective  Right lateral pretibia: 1.2cm pink scaly papule with adjacent scar     Objective  Right plantar ball of foot: Firm depressed papule 2.5 mm  Assessment & Plan  Inflamed seborrheic keratosis (2) Left Upper Arm x 1; Right upper med pretibia x 1  Recheck right pretibia on follow up.  Destruction of lesion - Left Upper Arm x 1, Right upper med pretibia x 1  Destruction method: cryotherapy   Informed consent: discussed and consent obtained   Lesion destroyed using liquid nitrogen: Yes   Region frozen until ice ball extended beyond lesion: Yes   Outcome: patient tolerated procedure well with no complications   Post-procedure details: wound care instructions given    Neoplasm of uncertain behavior of skin Right lateral pretibia  Skin / nail  biopsy Type of biopsy: tangential   Informed consent: discussed and consent obtained   Patient was prepped and draped in usual sterile fashion: Area prepped with alcohol. Anesthesia: the lesion was anesthetized in a standard fashion   Anesthetic:  1% lidocaine w/ epinephrine 1-100,000 buffered w/ 8.4% NaHCO3 Instrument used: flexible razor blade   Hemostasis achieved with: pressure, aluminum chloride and electrodesiccation   Outcome: patient tolerated procedure well   Post-procedure details: wound care instructions given   Post-procedure details comment:  Ointment and small bandage applied  Specimen 1 - Surgical pathology Differential Diagnosis: r/o recurrent SCC Check Margins: No [id number]  Possible recurrent SCC  Other viral warts Right plantar ball of foot  Cont mediplast once healed from LN2  Destruction of lesion - Right plantar ball of foot Complexity: simple   Destruction method: cryotherapy   Informed consent: discussed and consent obtained   Lesion destroyed using liquid nitrogen: Yes   Region frozen until ice ball extended beyond lesion: Yes   Outcome: patient tolerated procedure well with no complications   Post-procedure details: wound care instructions given   Return in about 2 months (around 04/19/2020) for recheck right pretibia.   Graciella Belton, RMA, am acting as scribe for Brendolyn Patty, MD .

## 2020-02-18 NOTE — Patient Instructions (Addendum)
Wound Care Instructions  1. Cleanse wound gently with soap and water once a day then pat dry with clean gauze. Apply a thing coat of Petrolatum (petroleum jelly, "Vaseline") over the wound (unless you have an allergy to this). We recommend that you use a new, sterile tube of Vaseline. Do not pick or remove scabs. Do not remove the yellow or white "healing tissue" from the base of the wound.  2. Cover the wound with fresh, clean, nonstick gauze and secure with paper tape. You may use Band-Aids in place of gauze and tape if the would is small enough, but would recommend trimming much of the tape off as there is often too much. Sometimes Band-Aids can irritate the skin.  3. You should call the office for your biopsy report after 1 week if you have not already been contacted.  4. If you experience any problems, such as abnormal amounts of bleeding, swelling, significant bruising, significant pain, or evidence of infection, please call the office immediately.  5. FOR ADULT SURGERY PATIENTS: If you need something for pain relief you may take 1 extra strength Tylenol (acetaminophen) AND 2 Ibuprofen (200mg each) together every 4 hours as needed for pain. (do not take these if you are allergic to them or if you have a reason you should not take them.) Typically, you may only need pain medication for 1 to 3 days.    Cryotherapy Aftercare  . Wash gently with soap and water everyday.   . Apply Vaseline and Band-Aid daily until healed.  Recommend daily broad spectrum sunscreen SPF 30+ to sun-exposed areas, reapply every 2 hours as needed. Call for new or changing lesions.  

## 2020-02-24 ENCOUNTER — Telehealth: Payer: Self-pay

## 2020-02-24 NOTE — Telephone Encounter (Signed)
-----   Message from Brendolyn Patty, MD sent at 02/21/2020 10:40 AM EDT ----- Skin , right lateral pretibia WELL DIFFERENTIATED SQUAMOUS CELL CARCINOMA, BASE INVOLVED  This is a recurrent SCC after EDC.  Recommend excision.

## 2020-02-24 NOTE — Telephone Encounter (Signed)
Advised pt biopsy on the right lateral pretibia was SCC. Surgery scheduled for 03/30/2020 at 1:30pm.

## 2020-03-30 ENCOUNTER — Encounter: Payer: Self-pay | Admitting: Dermatology

## 2020-03-30 ENCOUNTER — Ambulatory Visit: Payer: Medicare PPO | Admitting: Dermatology

## 2020-03-30 ENCOUNTER — Other Ambulatory Visit: Payer: Self-pay

## 2020-03-30 DIAGNOSIS — C44722 Squamous cell carcinoma of skin of right lower limb, including hip: Secondary | ICD-10-CM | POA: Diagnosis not present

## 2020-03-30 DIAGNOSIS — C4492 Squamous cell carcinoma of skin, unspecified: Secondary | ICD-10-CM

## 2020-03-30 MED ORDER — DOXYCYCLINE HYCLATE 100 MG PO CAPS: 100.0000 mg | ORAL_CAPSULE | Freq: Two times a day (BID) | ORAL | 0 refills | Status: AC

## 2020-03-30 MED ORDER — MUPIROCIN 2 % EX OINT: TOPICAL_OINTMENT | CUTANEOUS | 0 refills | Status: DC

## 2020-03-30 NOTE — Progress Notes (Signed)
   Follow-Up Visit   Subjective  Lori Wallace is a 84 y.o. female who presents for the following: SCC bx proven (R lat pretibial, edc in past, pt presents for Excision today). SCC recurred in [facility] site.  Area is still tender.   The following portions of the chart were reviewed this encounter and updated as appropriate:      Review of Systems:  No other skin or systemic complaints except as noted in HPI or Assessment and Plan.  Objective  Well appearing patient in no apparent distress; mood and affect are within normal limits.  A focused examination was performed including R lateral pretbia. Relevant physical exam findings are noted in the Assessment and Plan.  Objective  Right lateral pretibial: Pink bx site   Assessment & Plan  Squamous cell carcinoma of skin Right lateral pretibial  Skin excision  Lesion length (cm):  1.1 Lesion width (cm):  0.7 Margin per side (cm):  0.3 Total excision diameter (cm):  1.7 Informed consent: discussed and consent obtained   Timeout: patient name, date of birth, surgical site, and procedure verified   Procedure prep:  Patient was prepped and draped in usual sterile fashion Prep type:  Povidone-iodine Anesthesia: the lesion was anesthetized in a standard fashion   Anesthetic:  1% lidocaine w/ epinephrine 1-100,000 buffered w/ 8.4% NaHCO3 (8cc) Instrument used: #15 blade   Hemostasis achieved with: pressure, aluminum chloride and electrodesiccation   Outcome: patient tolerated procedure well with no complications   Post-procedure details: sterile dressing applied and wound care instructions given   Dressing type: pressure dressing (mupirocin ointment)   Additional details:  Wound left for 2ndary intention healing, 1.7 x 1.5cm  doxycycline (VIBRAMYCIN) 100 MG capsule  mupirocin ointment (BACTROBAN) 2 %  Specimen 1 - Surgical pathology Differential Diagnosis: C44.722 SCC Check Margins: yes Pink bx site 1.1 x 0.7cm Tag with suture  at 12:00 superior edge  Start doxycycline 100mg  take 1 po BID with food x 10 days dsp #20 0Rf Doxycycline should be taken with food to prevent nausea. Do not lay down for 30 minutes after taking. Be cautious with sun exposure and use good sun protection while on this medication. Pregnant women should not take this medication.   Start mupirocin 2% ointment Apply QD with bandage change dsp 22g 0Rf  Return in about 2 weeks (around 04/13/2020) for post op wound check.  I, Othelia Pulling, RMA, am acting as scribe for Brendolyn Patty, MD .  Documentation: I have reviewed the above documentation for accuracy and completeness, and I agree with the above.  Brendolyn Patty MD

## 2020-03-30 NOTE — Patient Instructions (Addendum)
Wound Care Instructions  1. Cleanse wound gently with soap and water once a day then pat dry with clean gauze. Apply a thing coat of Petrolatum (petroleum jelly, "Vaseline") over the wound (unless you have an allergy to this). We recommend that you use a new, sterile tube of Vaseline. Do not pick or remove scabs. Do not remove the yellow or white "healing tissue" from the base of the wound.  2. Cover the wound with fresh, clean, nonstick gauze and secure with paper tape. You may use Band-Aids in place of gauze and tape if the would is small enough, but would recommend trimming much of the tape off as there is often too much. Sometimes Band-Aids can irritate the skin.  3. You should call the office for your biopsy report after 1 week if you have not already been contacted.  4. If you experience any problems, such as abnormal amounts of bleeding, swelling, significant bruising, significant pain, or evidence of infection, please call the office immediately.  5. FOR ADULT SURGERY PATIENTS: If you need something for pain relief you may take 1 extra strength Tylenol (acetaminophen) AND 2 Ibuprofen (200mg  each) together every 4 hours as needed for pain. (do not take these if you are allergic to them or if you have a reason you should not take them.) Typically, you may only need pain medication for 1 to 3 days.     May wear a compression sock

## 2020-03-31 ENCOUNTER — Telehealth: Payer: Self-pay

## 2020-03-31 NOTE — Telephone Encounter (Signed)
Pt doing well after yesterdays surgery.  Advised pt to keep 2 wk f/u and if she had any problems before then to call the office./sh

## 2020-04-02 ENCOUNTER — Telehealth: Payer: Self-pay

## 2020-04-02 NOTE — Telephone Encounter (Signed)
Advised pt of bx results/sh ?

## 2020-04-02 NOTE — Telephone Encounter (Signed)
-----   Message from Brendolyn Patty, MD sent at 04/01/2020  5:14 PM EDT ----- Skin (M), right lateral pretibial NO RESIDUAL SQUAMOUS CELL CARCINOMA, MARGINS FREE

## 2020-04-15 ENCOUNTER — Ambulatory Visit: Payer: Medicare PPO | Admitting: Dermatology

## 2020-04-15 ENCOUNTER — Other Ambulatory Visit: Payer: Self-pay

## 2020-04-15 DIAGNOSIS — Z85828 Personal history of other malignant neoplasm of skin: Secondary | ICD-10-CM

## 2020-04-15 NOTE — Patient Instructions (Signed)

## 2020-04-15 NOTE — Progress Notes (Addendum)
   Follow-Up Visit   Subjective  Lori Wallace is a 84 y.o. female who presents for the following: Follow-up.  Patient here today for 2 week post op check. Patient had a SCC excised at right lateral pretibial and was left open to heal by 2ndary intention. Patient is using mupirocin and covering with a band-aid.   The following portions of the chart were reviewed this encounter and updated as appropriate:      Review of Systems:  No other skin or systemic complaints except as noted in HPI or Assessment and Plan.  Objective  Well appearing patient in no apparent distress; mood and affect are within normal limits.  A focused examination was performed including right lower leg. Relevant physical exam findings are noted in the Assessment and Plan.  Objective  Right Lateral Pretibial: 1.6 x 1.3cm full thickness ulceration. Incision site is clean, dry, no evidence of infection  Images     Assessment & Plan  History of SCC (squamous cell carcinoma) of skin Right Lateral Pretibial  Healing well. Continue wound care. Mupirocin ointment and Bandaid applied   Return in about 2 months (around 06/15/2020) for recheck excision site at right lateral pretibial.  Graciella Belton, RMA, am acting as scribe for Brendolyn Patty, MD .  Documentation: I have reviewed the above documentation for accuracy and completeness, and I agree with the above.  Brendolyn Patty MD

## 2020-05-04 ENCOUNTER — Ambulatory Visit: Payer: Medicare PPO | Admitting: Dermatology

## 2020-06-30 ENCOUNTER — Ambulatory Visit: Payer: Medicare PPO | Admitting: Dermatology

## 2020-06-30 ENCOUNTER — Other Ambulatory Visit: Payer: Self-pay

## 2020-06-30 DIAGNOSIS — Z85828 Personal history of other malignant neoplasm of skin: Secondary | ICD-10-CM | POA: Diagnosis not present

## 2020-06-30 DIAGNOSIS — D692 Other nonthrombocytopenic purpura: Secondary | ICD-10-CM

## 2020-06-30 NOTE — Progress Notes (Signed)
   Follow-Up Visit   Subjective  Lori Wallace is a 84 y.o. female who presents for the following: Follow-up.  Patient here today to follow up on excision site of SCC at right lat pretibial. Excision was 03/30/2020 and wound was left open for 2ndary intention healing. Patient advises it has healed up and she has been continuing wound care. Sometimes it gets hot at night and the area becomes a little sensitive  The following portions of the chart were reviewed this encounter and updated as appropriate:      Review of Systems:  No other skin or systemic complaints except as noted in HPI or Assessment and Plan.  Objective  Well appearing patient in no apparent distress; mood and affect are within normal limits.  A focused examination was performed including lower legs. Relevant physical exam findings are noted in the Assessment and Plan.  Objective  Right lateral pretibial: Pink patch with small central crusted papule  Images     Assessment & Plan  History of SCC (squamous cell carcinoma) of skin Right lateral pretibial  Healing well- appears clear without recurrence Continue wound care, with vaseline and cover daily  Purpura - Violaceous macules and patches at lower legs - Benign - Related to age, sun damage and/or use of blood thinners - Observe - Can use OTC arnica containing moisturizer such as Dermend Bruise Formula if desired - Call for worsening or other concerns   Return in about 3 months (around 09/30/2020) for follow up for SCC.  Graciella Belton, RMA, am acting as scribe for Brendolyn Patty, MD . Documentation: I have reviewed the above documentation for accuracy and completeness, and I agree with the above.  Brendolyn Patty MD

## 2020-06-30 NOTE — Patient Instructions (Signed)

## 2020-09-29 ENCOUNTER — Ambulatory Visit: Payer: Medicare PPO | Admitting: Dermatology

## 2020-09-29 ENCOUNTER — Encounter: Payer: Self-pay | Admitting: Dermatology

## 2020-09-29 ENCOUNTER — Other Ambulatory Visit: Payer: Self-pay

## 2020-09-29 DIAGNOSIS — Z85828 Personal history of other malignant neoplasm of skin: Secondary | ICD-10-CM

## 2020-09-29 DIAGNOSIS — L853 Xerosis cutis: Secondary | ICD-10-CM | POA: Diagnosis not present

## 2020-09-29 DIAGNOSIS — L578 Other skin changes due to chronic exposure to nonionizing radiation: Secondary | ICD-10-CM

## 2020-09-29 DIAGNOSIS — L570 Actinic keratosis: Secondary | ICD-10-CM

## 2020-09-29 DIAGNOSIS — D692 Other nonthrombocytopenic purpura: Secondary | ICD-10-CM
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
## 2020-09-29 NOTE — Patient Instructions (Addendum)
Dry Skin Care  What causes dry skin?  Dry skin is common and results from inadequate moisture in the outer skin layers. Dry skin usually results from the excessive loss of moisture from the skin surface. This occurs due to two major factors: 1. Normally the skin's oil glands deposit a layer of oil on the skin's surface. This layer of oil prevents the loss of moisture from the skin. Exposure to soaps, cleaners, solvents, and disinfectants removes this oily film, allowing water to escape. 2. Water loss from the skin increases when the humidity is low. During winter months we spend a lot of time indoors where the air is heated. Heated air has very low humidity. This also contributes to dry skin.  A tendency for dry skin may accompany such disorders as eczema. Also, as people age, the number of functioning oil glands decreases, and the tendency toward dry skin can be a sensation of skin tightness when emerging from the shower.  How do I manage dry skin?  1. Humidify your environment. This can be accomplished by using a humidifier in your bedroom at night during winter months. 2. Bathing can actually put moisture back into your skin if done right. Take the following steps while bathing to sooth dry skin:  Avoid hot water, which only dries the skin and makes itching worse. Use warm water.  Avoid washcloths or extensive rubbing or scrubbing.  Use mild soaps like unscented Dove, Oil of Olay, Cetaphil, Basis, or CeraVe.  If you take baths rather than showers, rinse off soap residue with clean water before getting out of tub.  Once out of the shower/tub, pat dry gently with a soft towel. Leave your skin damp.  While still damp, apply any medicated ointment/cream you were prescribed to the affected areas. After you apply your medicated ointment/cream, then apply your moisturizer to your whole body.This is the most important step in dry skin care. If this is omitted, your skin will continue to be  dry.  The choice of moisturizer is also very important. In general, lotion will not provider enough moisture to severely dry skin because it is water based. You should use an ointment or cream. Moisturizers should also be unscented. Good choices include Vaseline (plain petrolatum), Aquaphor, Cetaphil, CeraVe, Vanicream, DML Forte, Aveeno moisture, or Eucerin Cream.  Bath oils can be helpful, but do not replace the application of moisturizer after the bath. In addition, they make the tub slippery causing an increased risk for falls. Therefore, we do not recommend their use.  Recommend Gold Bond Rough & Bumpy to arms and legs daily.   Cryotherapy Aftercare  . Wash gently with soap and water everyday.   Marland Kitchen Apply Vaseline and Band-Aid daily until healed.

## 2021-04-25 ENCOUNTER — Emergency Department: Payer: Medicare PPO

## 2021-04-25 ENCOUNTER — Observation Stay
Admission: EM | Admit: 2021-04-25 | Discharge: 2021-04-28 | Disposition: A | Discharge status: Home / Self Care | Payer: Medicare PPO | Attending: Obstetrics and Gynecology | Admitting: Obstetrics and Gynecology

## 2021-04-25 ENCOUNTER — Other Ambulatory Visit: Payer: Self-pay

## 2021-04-25 DIAGNOSIS — I10 Essential (primary) hypertension: Secondary | ICD-10-CM | POA: Diagnosis present

## 2021-04-25 DIAGNOSIS — W19XXXA Unspecified fall, initial encounter: Secondary | ICD-10-CM | POA: Diagnosis not present

## 2021-04-25 DIAGNOSIS — Z91010 Allergy to peanuts: Secondary | ICD-10-CM | POA: Diagnosis not present

## 2021-04-25 DIAGNOSIS — Z853 Personal history of malignant neoplasm of breast: Secondary | ICD-10-CM | POA: Insufficient documentation

## 2021-04-25 DIAGNOSIS — Y92009 Unspecified place in unspecified non-institutional (private) residence as the place of occurrence of the external cause: Secondary | ICD-10-CM | POA: Insufficient documentation

## 2021-04-25 DIAGNOSIS — M4842XA Fatigue fracture of vertebra, cervical region, initial encounter for fracture: Secondary | ICD-10-CM

## 2021-04-25 DIAGNOSIS — Z20822 Contact with and (suspected) exposure to covid-19: Secondary | ICD-10-CM | POA: Diagnosis not present

## 2021-04-25 DIAGNOSIS — S32592A Other specified fracture of left pubis, initial encounter for closed fracture: Secondary | ICD-10-CM | POA: Diagnosis present

## 2021-04-25 DIAGNOSIS — S129XXA Fracture of neck, unspecified, initial encounter: Secondary | ICD-10-CM

## 2021-04-25 DIAGNOSIS — M25552 Pain in left hip: Secondary | ICD-10-CM

## 2021-04-25 DIAGNOSIS — Z96651 Presence of right artificial knee joint: Secondary | ICD-10-CM | POA: Insufficient documentation

## 2021-04-25 DIAGNOSIS — Z87891 Personal history of nicotine dependence: Secondary | ICD-10-CM | POA: Diagnosis not present

## 2021-04-25 DIAGNOSIS — Z96642 Presence of left artificial hip joint: Secondary | ICD-10-CM | POA: Insufficient documentation

## 2021-04-25 DIAGNOSIS — I672 Cerebral atherosclerosis: Secondary | ICD-10-CM | POA: Insufficient documentation

## 2021-04-25 DIAGNOSIS — Z79899 Other long term (current) drug therapy: Secondary | ICD-10-CM | POA: Diagnosis not present

## 2021-04-25 DIAGNOSIS — S32502A Unspecified fracture of left pubis, initial encounter for closed fracture: Secondary | ICD-10-CM | POA: Insufficient documentation

## 2021-04-25 DIAGNOSIS — W010XXA Fall on same level from slipping, tripping and stumbling without subsequent striking against object, initial encounter: Secondary | ICD-10-CM | POA: Diagnosis not present

## 2021-04-25 DIAGNOSIS — Z85828 Personal history of other malignant neoplasm of skin: Secondary | ICD-10-CM | POA: Insufficient documentation

## 2021-04-25 DIAGNOSIS — S199XXA Unspecified injury of neck, initial encounter: Secondary | ICD-10-CM | POA: Diagnosis present

## 2021-04-25 DIAGNOSIS — D649 Anemia, unspecified: Secondary | ICD-10-CM | POA: Diagnosis present

## 2021-04-25 HISTORY — DX: Other specified fracture of left pubis, initial encounter for closed fracture: S32.592A

## 2021-04-25 HISTORY — DX: Fracture of neck, unspecified, initial encounter: S12.9XXA

## 2021-04-25 LAB — BASIC METABOLIC PANEL
Anion gap: 6 (ref 5–15)
BUN: 24 mg/dL — ABNORMAL HIGH (ref 8–23)
CO2: 25 mmol/L (ref 22–32)
Calcium: 8.7 mg/dL — ABNORMAL LOW (ref 8.9–10.3)
Chloride: 104 mmol/L (ref 98–111)
Creatinine, Ser: 0.98 mg/dL (ref 0.44–1.00)
GFR, Estimated: 58 mL/min — ABNORMAL LOW (ref >60)
Glucose, Bld: 112 mg/dL — ABNORMAL HIGH (ref 70–99)
Potassium: 3.9 mmol/L (ref 3.5–5.1)
Sodium: 135 mmol/L (ref 135–145)

## 2021-04-25 LAB — CBC
HCT: 34.4 % — ABNORMAL LOW (ref 36.0–46.0)
Hemoglobin: 11 g/dL — ABNORMAL LOW (ref 12.0–15.0)
MCH: 30.4 pg (ref 26.0–34.0)
MCHC: 32 g/dL (ref 30.0–36.0)
MCV: 95 fL (ref 80.0–100.0)
Platelets: 267 10*3/uL (ref 150–400)
RBC: 3.62 MIL/uL — ABNORMAL LOW (ref 3.87–5.11)
RDW: 13 % (ref 11.5–15.5)
WBC: 7.8 10*3/uL (ref 4.0–10.5)
nRBC: 0 % (ref 0.0–0.2)

## 2021-04-25 MED ORDER — HYDRALAZINE HCL 20 MG/ML IJ SOLN: 5.0000 mg | INTRAMUSCULAR | Status: DC | PRN

## 2021-04-25 MED ORDER — ZINC SULFATE 220 (50 ZN) MG PO CAPS
220.0000 mg | ORAL_CAPSULE | Freq: Every day | ORAL | Status: DC
  Administered 2021-04-26 – 2021-04-28 (×3): 220 mg via ORAL
  Filled 2021-04-25 (×3): qty 1

## 2021-04-25 MED ORDER — ACETAMINOPHEN 325 MG PO TABS
650.0000 mg | ORAL_TABLET | Freq: Four times a day (QID) | ORAL | Status: DC | PRN
  Administered 2021-04-26: 650 mg via ORAL
  Filled 2021-04-25: qty 2

## 2021-04-25 MED ORDER — METHOCARBAMOL 500 MG PO TABS
500.0000 mg | ORAL_TABLET | Freq: Three times a day (TID) | ORAL | Status: DC | PRN
  Administered 2021-04-27 – 2021-04-28 (×2): 500 mg via ORAL
  Filled 2021-04-25 (×3): qty 1

## 2021-04-25 MED ORDER — ONDANSETRON HCL 4 MG/2ML IJ SOLN
4.0000 mg | Freq: Three times a day (TID) | INTRAMUSCULAR | Status: DC | PRN
  Administered 2021-04-26: 4 mg via INTRAVENOUS
  Filled 2021-04-25: qty 2

## 2021-04-25 MED ORDER — MORPHINE SULFATE (PF) 2 MG/ML IV SOLN: 0.5000 mg | INTRAVENOUS | Status: DC | PRN

## 2021-04-25 MED ORDER — LORATADINE 10 MG PO TABS: 10.0000 mg | ORAL_TABLET | Freq: Every day | ORAL | Status: DC | PRN

## 2021-04-25 MED ORDER — OXYCODONE-ACETAMINOPHEN 5-325 MG PO TABS
1.0000 | ORAL_TABLET | ORAL | Status: DC | PRN
  Administered 2021-04-25 – 2021-04-28 (×4): 1 via ORAL
  Filled 2021-04-25 (×4): qty 1

## 2021-04-25 MED ORDER — VITAMIN D 25 MCG (1000 UNIT) PO TABS
1000.0000 [IU] | ORAL_TABLET | Freq: Every day | ORAL | Status: DC
  Administered 2021-04-26 – 2021-04-28 (×3): 1000 [IU] via ORAL
  Filled 2021-04-25 (×3): qty 1

## 2021-04-25 MED ORDER — ACETAMINOPHEN 500 MG PO TABS
1000.0000 mg | ORAL_TABLET | Freq: Once | ORAL | Status: AC
  Administered 2021-04-25: 1000 mg via ORAL
  Filled 2021-04-25: qty 2

## 2021-04-25 MED ORDER — ATENOLOL 25 MG PO TABS
25.0000 mg | ORAL_TABLET | Freq: Every day | ORAL | Status: DC
  Administered 2021-04-26 – 2021-04-28 (×3): 25 mg via ORAL
  Filled 2021-04-25 (×3): qty 1

## 2021-04-25 MED ORDER — ENOXAPARIN SODIUM 40 MG/0.4ML IJ SOSY
40.0000 mg | PREFILLED_SYRINGE | INTRAMUSCULAR | Status: DC
  Administered 2021-04-25 – 2021-04-27 (×3): 40 mg via SUBCUTANEOUS
  Filled 2021-04-25 (×3): qty 0.4

## 2021-04-25 MED ORDER — POLYVINYL ALCOHOL 1.4 % OP SOLN
1.0000 [drp] | Freq: Every evening | OPHTHALMIC | Status: DC | PRN
  Filled 2021-04-25: qty 15

## 2021-04-25 NOTE — ED Provider Notes (Signed)
[facility] Emergency Department Provider Note ____________________________________________   Event Date/Time   First MD Initiated Contact with Patient 04/25/21 1055     (approximate)  I have reviewed the triage vital signs and the nursing notes.  HISTORY  Chief Complaint Fall   HPI Lori Wallace is a 85 y.o. femalewho presents to the ED for evaluation of a fall.   Chart review indicates previous left hip arthroplasty.  Patient presents to the ED, accompanied by her sister, for evaluation of left hip pain after a fall.  Patient lives at home alone.  She reports ambulating in her kitchen, getting ready for church, when she accidentally got tripped up over her own feet causing her to fall onto her left side.  She presents to the ED via EMS with complaints of neck pain and left hip pain.  Denies additional falls.  Denies head trauma or syncope.   Past Medical History:  Diagnosis Date   Actinic keratosis    Arthritis    Breast cancer ([place]) [phone]   Cancer [facility])    breast s/p bilateral lumpectomies with radiation,   Complication of anesthesia    Pt stated her top lip was "puffed up" and felt strange for weeks after having hip surgery. Pt thinks intubation appliance may have caused it   Constipation due to pain medication    Environmental and seasonal allergies    Frequency of urination    Hx of basal cell carcinoma 08/19/2010   Mid dorsum nose supra tip   Hypertension    pt has not had any blood pressure medications in over 4 years   Malignant neoplasm of upper-outer quadrant of female breast ([place]) 1995   [place] wide excision, reexcision, whole breast radiation.   Malignant neoplasm of upper-outer quadrant of female breast [facility]) January 2012   Left:Invasive ductal carcinoma ER 90%, PR 90%, HER-2/neu not over dressing. T1a   Malignant neoplasm of upper-outer quadrant of female breast ([place]) 2002   Right breast   Squamous cell carcinoma of  skin 11/23/2007   Right lateral upper eyelid. SCCis   Squamous cell carcinoma of skin 09/03/2019   Right lateral pretibia. EDC.    Squamous cell carcinoma of skin 03/30/2020   R lateral pretibia    Trigger finger of both hands     Patient Active Problem List   Diagnosis Date Noted   Cervical spine fracture ([place]) 04/25/2021   Fall at home, initial encounter 04/25/2021   Fracture of pubic ramus, left, closed, initial encounter ([place]) 04/25/2021   Normocytic anemia 04/25/2021   Breast cancer, unspecified laterality 01/06/2016   Left breast mass 01/06/2016   Essential hypertension, benign 01/06/2016   Compression fracture 10/23/2014   Breast cancer ([place]) 12/03/2013   Osteoarthritis of left hip 08/01/2012    Past Surgical History:  Procedure Laterality Date   BACK SURGERY  1960s   lumbar ruptured discs   BREAST BIOPSY     BREAST LUMPECTOMY Left 1995 with Radiation   BREAST LUMPECTOMY Right 2002   with Radiation   BREAST LUMPECTOMY Left 2011   BREAST SURGERY Bilateral    lumpectomies x 3   COLONOSCOPY  2006   Dr. Vira Agar   EYE SURGERY  1990s   bilat cataract removal   FRACTURE SURGERY     L wrist repair, R lower leg   JOINT REPLACEMENT     R knee w/prior arhtroscopy   KYPHOPLASTY N/A 10/23/2014   Procedure: KYPHOPLASTY;  Surgeon: Lawrence Santiago  Dumonski, MD;  Location: [place];  Service: Orthopedics;  Laterality: N/A;  T 12 kyphoplasty   TOTAL HIP ARTHROPLASTY  07/30/2012   Procedure: TOTAL HIP ARTHROPLASTY;  Surgeon: Kerin Salen, MD;  Location: [place];  Service: Orthopedics;  Laterality: Left;   uterine tumor excision     benign   WRIST SURGERY Left     Prior to Admission medications   Medication Sig Start Date End Date Taking? Authorizing Provider  Calcium-Magnesium-Vitamin D (CALCIUM MAGNESIUM PO) Take 1 tablet by mouth daily.    [provider]  cholecalciferol (VITAMIN D) 1000 UNITS tablet Take 1,000 Units by mouth daily.    [provider]   meloxicam (MOBIC) 7.5 MG tablet Take 7.5 mg by mouth as needed for pain.    [provider]  mupirocin ointment (BACTROBAN) 2 % Apply to affected area on right lower leg once a day with bandage changes until healed. 03/30/20   Brendolyn Patty, MD  Omega 3 1000 MG CAPS Take by mouth.    [provider]  Polyvinyl Alcohol-Povidone (REFRESH OP) Place 1 drop into both eyes as needed (dry eyes).     [provider]  Probiotic Product (PROBIOTIC DAILY PO) Take 1 tablet by mouth daily.    [provider]    Allergies Carrot [daucus carota], Demerol [meperidine], and Peanut-containing drug products  Family History  Problem Relation Age of Onset   Cancer Father    Cancer Sister    Stroke Sister    Breast cancer Neg Hx     Social History Social History   Tobacco Use   Smoking status: Former    Pack years: 0.00   Smokeless tobacco: Never  Substance Use Topics   Alcohol use: No   Drug use: No    Review of Systems  Constitutional: No fever/chills Eyes: No visual changes. ENT: No sore throat. Cardiovascular: Denies chest pain. Respiratory: Denies shortness of breath. Gastrointestinal: No abdominal pain.  No nausea, no vomiting.  No diarrhea.  No constipation. Genitourinary: Negative for dysuria. Musculoskeletal: Negative for back pain. Positive for basilar neck pain and left hip pain. Skin: Negative for rash. Neurological: Negative for headaches, focal weakness or numbness.  ____________________________________________   PHYSICAL EXAM:  VITAL SIGNS: Vitals:   04/25/21 1056 04/25/21 1424  BP: (!) 157/77 (!) 147/68  Pulse: (!) 58 (!) 59  Resp: 14 16  Temp: 98 F (36.7 C)   SpO2: 98% 96%    Constitutional: Alert and oriented. Well appearing and in no acute distress. Eyes: Conjunctivae are normal. PERRL. EOMI. Head: Atraumatic. Nose: No congestion/rhinnorhea. Mouth/Throat: Mucous membranes are moist.  Oropharynx non-erythematous. Neck:  No stridor. No cervical spine tenderness to palpation. Cardiovascular: Normal rate, regular rhythm. Grossly normal heart sounds.  Good peripheral circulation. Respiratory: Normal respiratory effort.  No retractions. Lungs CTAB. Gastrointestinal: Soft , nondistended, nontender to palpation. No CVA tenderness. Musculoskeletal:  No joint effusions. No signs of acute trauma. Tenderness diffusely over the left hip and medially to this anteriorly.  No evidence of open injury.  No laceration. Mild tenderness to palpation to her lower cervical spine without bony step-offs or signs of trauma to the back or neck. Neurologic:  Normal speech and language. No gross focal neurologic deficits are appreciated.  Skin:  Skin is warm, dry and intact. No rash noted. Psychiatric: Mood and affect are normal. Speech and behavior are normal. ____________________________________________   LABS (all labs ordered are listed, but only abnormal results are displayed)  Labs  Reviewed  CBC - Abnormal; Notable for the following components:      Result Value   RBC 3.62 (*)    Hemoglobin 11.0 (*)    HCT 34.4 (*)    All other components within normal limits  BASIC METABOLIC PANEL - Abnormal; Notable for the following components:   Glucose, Bld 112 (*)    BUN 24 (*)    Calcium 8.7 (*)    GFR, Estimated 58 (*)    All other components within normal limits  SARS CORONAVIRUS 2 (TAT 6-24 HRS)   ____________________________________________  12 Lead EKG  Sinus rhythm, rate of 57 bpm, normal axis and intervals.  No evidence of acute ischemia. ____________________________________________  RADIOLOGY  ED MD interpretation: CT cervical spine reviewed by me with evidence of nondisplaced horizontal fracture through previous ankylosis Plain film of the pelvis and left hip reviewed by me with evidence of left superior and inferior pubic rami fractures  Official radiology report(s): CT Head Wo Contrast  Result Date:  04/25/2021 CLINICAL DATA:  85 year old female status post fall with pain. EXAM: CT HEAD WITHOUT CONTRAST TECHNIQUE: Contiguous axial images were obtained from the base of the skull through the vertex without intravenous contrast. COMPARISON:  Head CT 06/29/2016. FINDINGS: Brain: Cerebral volume is stable and within normal limits for age. No midline shift, ventriculomegaly, mass effect, evidence of mass lesion, intracranial hemorrhage or evidence of cortically based acute infarction. Gray-white matter differentiation is within normal limits throughout the brain. Vascular: Calcified atherosclerosis at the skull base. No suspicious intracranial vascular hyperdensity. Skull: Stable.  No acute osseous abnormality identified. Sinuses/Orbits: Visualized paranasal sinuses and mastoids are clear. Other: No orbit or scalp soft tissue injury identified. IMPRESSION: No acute traumatic injury identified. Normal for age non contrast CT appearance of the brain. Electronically Signed   By: Genevie Ann M.D.   On: 04/25/2021 11:55   CT Cervical Spine Wo Contrast  Addendum Date: 04/25/2021   ADDENDUM REPORT: 04/25/2021 12:13 ADDENDUM: Study discussed by telephone with Dr. Vladimir Crofts on 04/25/2021 at 12:13 . Electronically Signed   By: Genevie Ann M.D.   On: 04/25/2021 12:13   Result Date: 04/25/2021 CLINICAL DATA:  85 year old female status post fall with pain. EXAM: CT CERVICAL SPINE WITHOUT CONTRAST TECHNIQUE: Multidetector CT imaging of the cervical spine was performed without intravenous contrast. Multiplanar CT image reconstructions were also generated. COMPARISON:  Head CT today. FINDINGS: Alignment: Mild straightening of cervical lordosis. Subtle degenerative appearing anterolisthesis of C7 on T1 with associated right facet arthropathy. Bilateral posterior element alignment is within normal limits. Skull base and vertebrae: Visualized skull base is intact. No atlanto-occipital dissociation. C1 and C2 appear intact with maintained  alignment. Chronic C5-C6 interbody ankylosis, within in completed horizontal fracture through the left aspect of the fused disc space. See series 7, image 25 and sagittal images 34 through 38. The fracture extends toward the left C6 neural foramen which is stenotic due to bony overgrowth. C5 pedicles and posterior elements appear to remain intact and normally aligned. Questionable fracture lucency through the right disc space on series 6, image 26, although lucency cannot be tracked all the way across the vertebral bodies. The other cervical vertebrae appear intact. No other acute osseous abnormality identified. Soft tissues and spinal canal: No prevertebral fluid or swelling. No visible canal hematoma. Moderate calcified carotid atherosclerosis on the left. Disc levels: Severe C1-C2 joint space loss and degeneration, including bulky partially calcified ligamentous hypertrophy and posterior right C1 osteophytosis which results in at  least mild C1 level spinal stenosis. Ankylosis of the C2-C3 posterior elements. Disc space loss with leftward endplate spurring and moderate to severe right facet hypertrophy at C3-C4. Bulky is a circumferential disc and endplate degeneration at C4-C5 eccentric to the right. Partial fracture through the C5-C6 chronic interbody ankylosis as detailed above. Bulky circumferential disc and endplate degeneration at C6-C7. Mild degenerative anterolisthesis at C7-T1 with facet degeneration greater on the right. Subsequent mild to moderate degenerative spinal stenosis at C4-C5, mild at C6-C7. Upper chest: Visible upper thoracic levels appear intact. Apical lung scarring. IMPRESSION: 1. Chronically ankylosed C5-C6 vertebral bodies with an unusual, incompleted horizontal fracture through the left aspect of the fusion. No displacement. The posterior elements appear to remain intact. Recommend Spine Surgery consultation. 2. No other acute traumatic injury identified in the cervical spine, with  ankylosis also at C2-C3. And severe adjacent segment disease at C1-C2, C4-C5, and C6-C7 with spinal stenosis at each level. Electronically Signed: By: Genevie Ann M.D. On: 04/25/2021 12:05   DG Hip Unilat With Pelvis 2-3 Views Left  Result Date: 04/25/2021 CLINICAL DATA:  85 year old female status post fall with pain. EXAM: DG HIP (WITH OR WITHOUT PELVIS) 2-3V LEFT COMPARISON:  Abdominal radiographs 09/24/2014. Pelvis radiograph 07/30/2012. FINDINGS: Chronic left bipolar hip arthroplasty. Hardware appears intact and normally located. Proximal left femur appears intact but there are comminuted fractures of the left superior pubic ramus at the junction with the acetabulum and left medial inferior pubic ramus. Mild displacement. Pubic symphysis remains aligned. Right femoral head normally located and proximal right femur grossly intact. No other pelvic fracture identified. Nonobstructed visible bowel gas pattern with retained stool. IMPRESSION: 1. Comminuted fractures of the left pubic rami, including at the junction with the acetabulum. 2. Intact left hip arthroplasty. Electronically Signed   By: Genevie Ann M.D.   On: 04/25/2021 11:52    ____________________________________________   PROCEDURES and INTERVENTIONS  Procedure(s) performed (including Critical Care):  .1-3 Lead EKG Interpretation  Date/Time: 04/25/2021 2:40 PM Performed by: Vladimir Crofts, MD Authorized by: Vladimir Crofts, MD     Interpretation: normal     ECG rate:  61   ECG rate assessment: normal     Rhythm: sinus rhythm     Ectopy: none     Conduction: normal    Medications  oxyCODONE-acetaminophen (PERCOCET/ROXICET) 5-325 MG per tablet 1 tablet (has no administration in time range)  morphine 2 MG/ML injection 0.5 mg (has no administration in time range)  methocarbamol (ROBAXIN) tablet 500 mg (has no administration in time range)  ondansetron (ZOFRAN) injection 4 mg (has no administration in time range)  acetaminophen (TYLENOL)  tablet 650 mg (has no administration in time range)  hydrALAZINE (APRESOLINE) injection 5 mg (has no administration in time range)  acetaminophen (TYLENOL) tablet 1,000 mg (1,000 mg Oral Given 04/25/21 1313)    ____________________________________________   MDM / ED COURSE   85 year old woman presents from home for evaluation after a fall, with evidence of pubic rami fractures and nonsurgical cervical fracture, requiring medical admission for possible rehab placement.  No evidence of neurologic or vascular deficits.  No evidence of open injury or distress.  Work-up reveals to ipsilateral pubic rami fractures without evidence of hardware malfunction or repeat hip fracture.  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  She has no evidence of neurologic deficits from either of these injuries.  I discussed the case with both neurosurgery and orthopedics, who recommends  nonoperative management.  Due to her living alone at home, I anticipate unsafe discharge plan at this time due to her difficulty ambulating.  We will admit to medicine for pain control, physical therapy and possible rehabilitation placement.  Clinical Course as of 04/25/21 1545  Sun Apr 25, 2021  1246 Dr. Lacinda Axon, NSGY, agrees to see the patient in consultation [DS]  1247 He has seen the patient and recommends collar and clinic f/u in 3-4 weeks [DS]  1517 I speak with Mack Guise, ortho, he recommends weight bear as tolerated.  [DS]  59 I speak with Dr. Blaine Hamper, who agrees to admit [DS]    Clinical Course User Index [DS] Vladimir Crofts, MD    ____________________________________________   FINAL CLINICAL IMPRESSION(S) / ED DIAGNOSES  Final diagnoses:  Fall, initial encounter  Left hip pain  Closed fracture of ramus of left pubis, initial encounter [facility])  Stress fracture of cervical vertebra, initial encounter     ED Discharge Orders     None        Tayler Heiden   Note:  This document was prepared using Dragon voice recognition software and may include unintentional dictation errors.    Vladimir Crofts, MD 04/25/21 [phone]

## 2021-04-25 NOTE — Consult Note (Signed)
Neurosurgery-New Consultation Evaluation 04/25/2021 Lori Wallace [id number]  Identifying Statement: Lori Wallace is a 85 y.o. female from [place] [id number] with fall  Physician Requesting Consultation: [facility]  History of Present Illness: Lori Wallace is here for evaluation after mechanical fall.  She is currently having pain at the area of the cervical spine as well as her left hip. She does not endorse any pain going into the arms.  She denies any numbness or weakness.  She is in a cervical collar.  CT scan of the neck did reveal a small fracture line within the C5-6 space without displacement or signs of instability.  Neurosurgery was consulted for evaluation and treatment.  Past Medical History:  Past Medical History:  Diagnosis Date   Actinic keratosis    Arthritis    Breast cancer ([place]) [phone]   Cancer [facility])    breast s/p bilateral lumpectomies with radiation,   Complication of anesthesia    Pt stated her top lip was "puffed up" and felt strange for weeks after having hip surgery. Pt thinks intubation appliance may have caused it   Constipation due to pain medication    Environmental and seasonal allergies    Frequency of urination    Hx of basal cell carcinoma 08/19/2010   Mid dorsum nose supra tip   Hypertension    pt has not had any blood pressure medications in over 4 years   Malignant neoplasm of upper-outer quadrant of female breast ([place]) 1995   [place] wide excision, reexcision, whole breast radiation.   Malignant neoplasm of upper-outer quadrant of female breast [facility]) January 2012   Left:Invasive ductal carcinoma ER 90%, PR 90%, HER-2/neu not over dressing. T1a   Malignant neoplasm of upper-outer quadrant of female breast ([place]) 2002   Right breast   Squamous cell carcinoma of skin 11/23/2007   Right lateral upper eyelid. SCCis   Squamous cell carcinoma of skin 09/03/2019   Right lateral pretibia. EDC.    Squamous cell  carcinoma of skin 03/30/2020   R lateral pretibia    Trigger finger of both hands     Social History: Social History   Socioeconomic History   Marital status: Single    Spouse name: Not on file   Number of children: Not on file   Years of education: Not on file   Highest education level: Not on file  Occupational History   Not on file  Tobacco Use   Smoking status: Former    Pack years: 0.00   Smokeless tobacco: Never  Substance and Sexual Activity   Alcohol use: No   Drug use: No   Sexual activity: Not on file  Other Topics Concern   Not on file  Social History Narrative   Not on file   Social Determinants of Health   Financial Resource Strain: Not on file  Food Insecurity: Not on file  Transportation Needs: Not on file  Physical Activity: Not on file  Stress: Not on file  Social Connections: Not on file  Intimate Partner Violence: Not on file    Family History: Family History  Problem Relation Age of Onset   Cancer Father    Cancer Sister    Stroke Sister    Breast cancer Neg Hx     Review of Systems:  Review of Systems - General ROS: Negative Psychological ROS: Negative Ophthalmic ROS: Negative ENT ROS: Negative Hematological and Lymphatic ROS: Negative  Endocrine ROS: Negative Respiratory ROS: Negative  Cardiovascular ROS: Negative Gastrointestinal ROS: Negative Genito-Urinary ROS: Negative Musculoskeletal ROS: Positive for left hip pain Neurological ROS: Positive for neck pain Dermatological ROS: Negative  Physical Exam: BP (!) 157/77 (BP Location: Right Arm)   Pulse (!) 58   Temp 98 F (36.7 C) (Oral)   Resp 14   Ht $R'5\' 10"'TB$  (1.778 m)   Wt 74.8 kg   SpO2 98%   BMI 23.68 kg/m  Body mass index is 23.68 kg/m. Body surface area is 1.92 meters squared. General appearance: Alert, cooperative, in no acute distress Head: Normocephalic, atraumatic Eyes: Normal, EOM intact Oropharynx: Moist without lesions Neck: Supple, in cervical  collar  Neurologic exam:  Mental status: alertness: alert, affect: normal Speech: fluent and clear Motor:strength symmetric 5/5 in bilateral upper and lower extremities throughout all motor groups Sensory: intact to light touch in all extremities Reflexes: 2+ and symmetric bilaterally for biceps Gait: normal   Laboratory: Results for orders placed or performed during the hospital encounter of 04/25/21  CBC  Result Value Ref Range   WBC 7.8 4.0 - 10.5 K/uL   RBC 3.62 (L) 3.87 - 5.11 MIL/uL   Hemoglobin 11.0 (L) 12.0 - 15.0 g/dL   HCT 34.4 (L) 36.0 - 46.0 %   MCV 95.0 80.0 - 100.0 fL   MCH 30.4 26.0 - 34.0 pg   MCHC 32.0 30.0 - 36.0 g/dL   RDW 13.0 11.5 - 15.5 %   Platelets 267 150 - 400 K/uL   nRBC 0.0 0.0 - 0.2 %  Basic metabolic panel  Result Value Ref Range   Sodium 135 135 - 145 mmol/L   Potassium 3.9 3.5 - 5.1 mmol/L   Chloride 104 98 - 111 mmol/L   CO2 25 22 - 32 mmol/L   Glucose, Bld 112 (H) 70 - 99 mg/dL   BUN 24 (H) 8 - 23 mg/dL   Creatinine, Ser 0.98 0.44 - 1.00 mg/dL   Calcium 8.7 (L) 8.9 - 10.3 mg/dL   GFR, Estimated 58 (L) >60 mL/min   Anion gap 6 5 - 15   I personally reviewed labs  Imaging: CT cervical spine:Chronically ankylosed C5-C6 vertebral bodies with an unusual, incomplete horizontal fracture through the left aspect of the fusion. No displacement. The posterior elements appear to remain intact.  No other acute traumatic injury identified in the cervical spine, with ankylosis also at C2-C3. And severe adjacent segment disease at C1-C2, C4-C5, and C6-C7 with spinal stenosis at each level.  Impression/Plan:  Lori Wallace is here for evaluation of a fall and a fracture within the C5-6 and closed displaced.  Given the lack of displacement, normal neurologic exam, and age, I would not recommend intervention for this at this time.  I do think she needs to be immobilized in a cervical collar and she can follow-up in clinic in 3 to 4 weeks for repeat exam with  x-rays.  She is cleared to proceed with any treatment for other injuries.  Do recommend fall avoidance.   1.  Diagnosis: Cervical spine fracture  2.  Plan -Continue in [place] J or [place] collar -Follow-up in 3 to 4 weeks for x-rays in clinic

## 2021-04-25 NOTE — ED Notes (Signed)
Pt sitting up eating supper.

## 2021-04-25 NOTE — ED Triage Notes (Signed)
pT bib ems for mechanical fall at home. She was getting ready for church when she tripped and fell. She is unaware of LOC and hitting her head but does have pain in left hip and at base of C-spine so EMS placed in her Collar. Vitals stable for EMS>

## 2021-04-25 NOTE — ED Notes (Signed)
Pt in scans. Family member at bedside. Will obtain EKG when pt returns.

## 2021-04-25 NOTE — H&P (Signed)
History and Physical    Lori Wallace Lori Wallace DOB: 12/05/1935 DOA: 04/25/2021  Referring MD/NP/PA:   PCP: Kandyce Rud, MD   Patient coming from:  The patient is coming from home.  At baseline, pt is independent for most of ADL.        Chief Complaint: fall, neck pain and left hip pain  HPI: Lori Wallace is a 85 y.o. female with medical history significant of hypertension, skin cancer, breast cancer (s/p 04 bilateral lumpectomy), who presents with fall, neck pain and left hip pain.  Patient states that she accidentally tripped her steps and fell when she was getting ready for church at 9:20 AM.  Denies loss of consciousness.  Patient injured her neck and left hip, causing pain in left hip and her neck.  She states the left hip is more painful than neck.  The pain left hip is constant, sharp, severe, nonradiating.  No leg numbness. Patient denies chest pain, cough, shortness of breath no fever or chills.  Denies nausea, vomiting, diarrhea or abdominal pain.  No symptoms of UTI.  ED Course: pt was found to have WBC 7.8, pending COVID-19 PCR, electrolytes renal function okay, temperature normal, blood pressure 147/68, heart rate 59, RR 16, oxygen saturation 96% on room air.  CT of head is negative for acute intracranial abnormalities.  Patient is placed on MedSurg bed for observation.  Dr. Adriana Simas of neurosurgery is consulted.  ED physician discussed with Dr. Martha Clan of orthopedic surgery on the phone.  X-ray of left hip/pelvis: 1. Comminuted fractures of the left pubic rami, including at the junction with the acetabulum. 2. Intact left hip arthroplasty.  CT- C spin: 1. Chronically ankylosed C5-C6 vertebral bodies with an unusual, incompleted horizontal fracture through the left aspect of the fusion. No displacement. The posterior elements appear to remain intact. Recommend Spine Surgery consultation.   2. No other acute traumatic injury identified in the cervical spine, with  ankylosis also at C2-C3. And severe adjacent segment disease at C1-C2, C4-C5, and C6-C7 with spinal stenosis at each level.  Review of Systems:   General: no fevers, chills, no body weight gain, has fatigue HEENT: no blurry vision, hearing changes or sore throat Respiratory: no dyspnea, coughing, wheezing CV: no chest pain, no palpitations GI: no nausea, vomiting, abdominal pain, diarrhea, constipation GU: no dysuria, burning on urination, increased urinary frequency, hematuria  Ext: no leg edema Neuro: no unilateral weakness, numbness, or tingling, no vision change or hearing loss. Has fall Skin: no rash, no skin tear. MSK: has neck pain and left hip pain Heme: No easy bruising.  Travel history: No recent long distant travel.  Allergy:  Allergies  Allergen Reactions   Carrot [Daucus Carota] Itching    States theres a list of 15 foods that make her itch, but she eats anyway   Demerol [Meperidine] Nausea And Vomiting   Peanut-Containing Drug Products Itching    Past Medical History:  Diagnosis Date   Actinic keratosis    Arthritis    Breast cancer (HCC) [phone]   Cancer (HCC)    breast s/p bilateral lumpectomies with radiation,   Complication of anesthesia    Pt stated her top lip was "puffed up" and felt strange for weeks after having hip surgery. Pt thinks intubation appliance may have caused it   Constipation due to pain medication    Environmental and seasonal allergies    Frequency of urination    Hx of basal cell carcinoma 08/19/2010  Mid dorsum nose supra tip   Hypertension    pt has not had any blood pressure medications in over 4 years   Malignant neoplasm of upper-outer quadrant of female breast ([place]) 1995   [place] wide excision, reexcision, whole breast radiation.   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 T1a   Malignant neoplasm of  upper-outer quadrant of female breast ([place]) 2002   Right breast   Squamous cell carcinoma of skin 11/23/2007   Right lateral upper eyelid. SCCis   Squamous cell carcinoma of skin 09/03/2019   Right lateral pretibia. EDC.    Squamous cell carcinoma of skin 03/30/2020   R lateral pretibia    Trigger finger of both hands     Past Surgical History:  Procedure Laterality Date   BACK SURGERY  1960s   lumbar ruptured discs   BREAST BIOPSY     BREAST LUMPECTOMY Left 1995 with Radiation   BREAST LUMPECTOMY Right 2002   with Radiation   BREAST LUMPECTOMY Left 2011   BREAST SURGERY Bilateral    lumpectomies x 3   COLONOSCOPY  2006   Dr. Vira Agar   EYE SURGERY  1990s   bilat cataract removal   FRACTURE SURGERY     L wrist repair, R lower leg   JOINT REPLACEMENT     R knee w/prior arhtroscopy   KYPHOPLASTY N/A 10/23/2014   Procedure: KYPHOPLASTY;  Surgeon: Sinclair Ship, MD;  Location: [place];  Service: Orthopedics;  Laterality: N/A;  T 12 kyphoplasty   TOTAL HIP ARTHROPLASTY  07/30/2012   Procedure: TOTAL HIP ARTHROPLASTY;  Surgeon: Kerin Salen, MD;  Location: [place];  Service: Orthopedics;  Laterality: Left;   uterine tumor excision     benign   WRIST SURGERY Left     Social History:  reports that she has quit smoking. She has never used smokeless tobacco. She reports that she does not drink alcohol and does not use drugs.  Family History:  Family History  Problem Relation Age of Onset   Cancer Father    Cancer Sister    Stroke Sister    Breast cancer Neg Hx      Prior to Admission medications   Medication Sig Start Date End Date Taking? Authorizing Provider  Calcium-Magnesium-Vitamin D (CALCIUM MAGNESIUM PO) Take 1 tablet by mouth daily.    [provider]  cholecalciferol (VITAMIN D) 1000 UNITS tablet Take 1,000 Units by mouth daily.    [provider]  meloxicam (MOBIC) 7.5 MG tablet Take 7.5 mg by mouth as needed for pain.    [provider]  mupirocin ointment (BACTROBAN) 2 % Apply to affected area on right lower leg once a day with bandage changes until healed. 03/30/20   Brendolyn Patty, MD  Omega 3 1000 MG CAPS Take by mouth.    [provider]  Polyvinyl Alcohol-Povidone (REFRESH OP) Place 1 drop into both eyes as needed (dry eyes).     [provider]  Probiotic Product (PROBIOTIC DAILY PO) Take 1 tablet by mouth daily.    [provider]    Physical Exam: Vitals:   04/25/21 1056 04/25/21 1057 04/25/21 1424  BP: (!) 157/77  (!) 147/68  Pulse: (!) 58  (!) 59  Resp: 14  16  Temp: 98 F (36.7 C)    TempSrc: Oral    SpO2: 98%  96%  Weight:  74.8 kg   Height:  $Remove'5\' 10"'zGUUghG$  (1.778 m)    General: Not in acute distress HEENT: has neck pain, C-collar in placed       Eyes: PERRL, EOMI, no scleral icterus.       ENT: No discharge from the ears and nose, no pharynx injection, no tonsillar enlargement.        Neck: No JVD, no bruit, no mass felt. Heme: No neck lymph node enlargement. Cardiac: S1/S2, RRR, No murmurs, No gallops or rubs. Respiratory: No rales, wheezing, rhonchi or rubs. GI: Soft, nondistended, nontender, no rebound pain, no organomegaly, BS present. GU: No hematuria Ext: No pitting leg edema bilaterally. 1+DP/PT pulse bilaterally. Musculoskeletal: has tenderness in left hip and left pelvis Skin: No rashes.  Neuro: Alert, oriented X3, cranial nerves II-XII grossly intact, moves all extremities normally.  Psych: Patient is not psychotic, no suicidal or hemocidal ideation.  Labs on Admission: I have personally reviewed following labs and imaging studies  CBC: Recent Labs  Lab 04/25/21 1101  WBC 7.8  HGB 11.0*  HCT 34.4*  MCV 95.0  PLT 456   Basic Metabolic Panel: Recent Labs  Lab 04/25/21 1101  NA 135  K 3.9  CL 104  CO2 25  GLUCOSE 112*  BUN 24*  CREATININE 0.98  CALCIUM 8.7*   GFR: Estimated Creatinine Clearance: 47.9 mL/min (by C-G formula based on SCr  of 0.98 mg/dL). Liver Function Tests: No results for input(s): AST, ALT, ALKPHOS, BILITOT, PROT, ALBUMIN in the last 168 hours. No results for input(s): LIPASE, AMYLASE in the last 168 hours. No results for input(s): AMMONIA in the last 168 hours. Coagulation Profile: No results for input(s): INR, PROTIME in the last 168 hours. Cardiac Enzymes: No results for input(s): CKTOTAL, CKMB, CKMBINDEX, TROPONINI in the last 168 hours. BNP (last 3 results) No results for input(s): PROBNP in the last 8760 hours. HbA1C: No results for input(s): HGBA1C in the last 72 hours. CBG: No results for input(s): GLUCAP in the last 168 hours. Lipid Profile: No results for input(s): CHOL, HDL, LDLCALC, TRIG, CHOLHDL, LDLDIRECT in the last 72 hours. Thyroid Function Tests: No results for input(s): TSH, T4TOTAL, FREET4, T3FREE, THYROIDAB in the last 72 hours. Anemia Panel: No results for input(s): VITAMINB12, FOLATE, FERRITIN, TIBC, IRON, RETICCTPCT in the last 72 hours. Urine analysis:    Component Value Date/Time   COLORURINE YELLOW 10/21/2014 [place] 10/21/2014 1259   APPEARANCEUR Clear 09/24/2014 1532   LABSPEC 1.008 10/21/2014 1259   LABSPEC 1.004 09/24/2014 1532   PHURINE 6.5 10/21/2014 1259   GLUCOSEU NEGATIVE 10/21/2014 1259   GLUCOSEU Negative 09/24/2014 1532   HGBUR NEGATIVE 10/21/2014 1259   BILIRUBINUR NEGATIVE 10/21/2014 1259   BILIRUBINUR Negative 09/24/2014 1532   KETONESUR NEGATIVE 10/21/2014 1259   PROTEINUR NEGATIVE 10/21/2014 1259   UROBILINOGEN 0.2 10/21/2014 1259   NITRITE NEGATIVE 10/21/2014 1259   LEUKOCYTESUR NEGATIVE 10/21/2014 1259   LEUKOCYTESUR Negative 09/24/2014 1532   Sepsis Labs: $RemoveBefo'@LABRCNTIP'FAZnvcogmkV$ (procalcitonin:4,lacticidven:4) )No results found for this or any previous visit (from the past 240 hour(s)).   Radiological Exams on Admission: CT Head Wo Contrast  Result Date: 04/25/2021 CLINICAL DATA:  85 year old female status post fall with pain. EXAM:  CT HEAD WITHOUT CONTRAST TECHNIQUE: Contiguous axial images were obtained from the base of the skull through the vertex without intravenous contrast. COMPARISON:  Head CT 06/29/2016. FINDINGS: Brain: Cerebral volume is stable and within normal limits for age. No midline shift, ventriculomegaly, mass effect, evidence  of mass lesion, intracranial hemorrhage or evidence of cortically based acute infarction. Gray-white matter differentiation is within normal limits throughout the brain. Vascular: Calcified atherosclerosis at the skull base. No suspicious intracranial vascular hyperdensity. Skull: Stable.  No acute osseous abnormality identified. Sinuses/Orbits: Visualized paranasal sinuses and mastoids are clear. Other: No orbit or scalp soft tissue injury identified. IMPRESSION: No acute traumatic injury identified. Normal for age non contrast CT appearance of the brain. Electronically Signed   By: Genevie Ann M.D.   On: 04/25/2021 11:55   CT Cervical Spine Wo Contrast  Addendum Date: 04/25/2021   ADDENDUM REPORT: 04/25/2021 12:13 ADDENDUM: Study discussed by telephone with Dr. Vladimir Crofts on 04/25/2021 at 12:13 . Electronically Signed   By: Genevie Ann M.D.   On: 04/25/2021 12:13   Result Date: 04/25/2021 CLINICAL DATA:  85 year old female status post fall with pain. EXAM: CT CERVICAL SPINE WITHOUT CONTRAST TECHNIQUE: Multidetector CT imaging of the cervical spine was performed without intravenous contrast. Multiplanar CT image reconstructions were also generated. COMPARISON:  Head CT today. FINDINGS: Alignment: Mild straightening of cervical lordosis. Subtle degenerative appearing anterolisthesis of C7 on T1 with associated right facet arthropathy. Bilateral posterior element alignment is within normal limits. Skull base and vertebrae: Visualized skull base is intact. No atlanto-occipital dissociation. C1 and C2 appear intact with maintained alignment. Chronic C5-C6 interbody ankylosis, within in completed horizontal  fracture through the left aspect of the fused disc space. See series 7, image 25 and sagittal images 34 through 38. The fracture extends toward the left C6 neural foramen which is stenotic due to bony overgrowth. C5 pedicles and posterior elements appear to remain intact and normally aligned. Questionable fracture lucency through the right disc space on series 6, image 26, although lucency cannot be tracked all the way across the vertebral bodies. The other cervical vertebrae appear intact. No other acute osseous abnormality identified. Soft tissues and spinal canal: No prevertebral fluid or swelling. No visible canal hematoma. Moderate calcified carotid atherosclerosis on the left. Disc levels: Severe C1-C2 joint space loss and degeneration, including bulky partially calcified ligamentous hypertrophy and posterior right C1 osteophytosis which results in at least mild C1 level spinal stenosis. Ankylosis of the C2-C3 posterior elements. Disc space loss with leftward endplate spurring and moderate to severe right facet hypertrophy at C3-C4. Bulky is a circumferential disc and endplate degeneration at C4-C5 eccentric to the right. Partial fracture through the C5-C6 chronic interbody ankylosis as detailed above. Bulky circumferential disc and endplate degeneration at C6-C7. Mild degenerative anterolisthesis at C7-T1 with facet degeneration greater on the right. Subsequent mild to moderate degenerative spinal stenosis at C4-C5, mild at C6-C7. Upper chest: Visible upper thoracic levels appear intact. Apical lung scarring. IMPRESSION: 1. Chronically ankylosed C5-C6 vertebral bodies with an unusual, incompleted horizontal fracture through the left aspect of the fusion. No displacement. The posterior elements appear to remain intact. Recommend Spine Surgery consultation. 2. No other acute traumatic injury identified in the cervical spine, with ankylosis also at C2-C3. And severe adjacent segment disease at C1-C2, C4-C5, and  C6-C7 with spinal stenosis at each level. Electronically Signed: By: Genevie Ann M.D. On: 04/25/2021 12:05   DG Hip Unilat With Pelvis 2-3 Views Left  Result Date: 04/25/2021 CLINICAL DATA:  85 year old female status post fall with pain. EXAM: DG HIP (WITH OR WITHOUT PELVIS) 2-3V LEFT COMPARISON:  Abdominal radiographs 09/24/2014. Pelvis radiograph 07/30/2012. FINDINGS: Chronic left bipolar hip arthroplasty. Hardware appears intact and normally located. Proximal left femur appears intact but there  are comminuted fractures of the left superior pubic ramus at the junction with the acetabulum and left medial inferior pubic ramus. Mild displacement. Pubic symphysis remains aligned. Right femoral head normally located and proximal right femur grossly intact. No other pelvic fracture identified. Nonobstructed visible bowel gas pattern with retained stool. IMPRESSION: 1. Comminuted fractures of the left pubic rami, including at the junction with the acetabulum. 2. Intact left hip arthroplasty. Electronically Signed   By: Genevie Ann M.D.   On: 04/25/2021 11:52     EKG: I have personally reviewed.  Sinus rhythm, QTC 428, early R wave progression, no ischemic change  Assessment/Plan Principal Problem:   Cervical spine fracture [facility]) Active Problems:   Essential hypertension, benign   Fall at home, initial encounter   Fracture of pubic ramus, left, closed, initial encounter ([place])   Normocytic anemia   Cervical spine fracture [facility]): Dr. Lacinda Axon of neurosurgery is consulted. He recommended immobilized in a cervical collar and follow-up in clinic in 3 to 4 weeks for repeat exam with x-rays  -place in MedSurg bed for 15 -Pain control: As needed Percocet, morphine, Tylenol -Consult transitional care team for possible placement and home health need  Fracture of pubic ramus, left, closed, initial encounter: ED physician discussed with Dr. Mack Guise of Ortho, he recommended weightbearing as tolerated -Will focus on  pain control -PT/OT when pain is controlled -As needed Robaxin for muscle spasm -As needed morphine, Tylenol, Percocet  Essential hypertension, benign -Continue home atenolol -IV hydralazine as needed  Fall at home, initial encounter -PT/OT when pain is controlled  Normocytic anemia: Hemoglobin stable 11.0 -Follow-up with CBC         DVT ppx: SQ Lovenox Code Status: Full code per patient Family Communication:  Yes, patient's sister at bed side Disposition Plan:  Anticipate discharge back to previous environment Consults called:  Dr. Lacinda Axon of neurosurgery is consulted.  ED physician discussed with Dr. Mack Guise of orthopedic surgery on the phone Admission status and Level of care: Med-Surg:     for obs     Status is: Observation  The patient remains OBS appropriate and will d/c before 2 midnights.  Dispo: The patient is from: Home              Anticipated d/c is to:  to be determined              Patient currently is not medically stable to d/c.   Difficult to place patient No          Date of Service 04/25/2021    [place] [facility]   If 7PM-7AM, please contact night-coverage [email or web address] 04/25/2021, 3:11 PM

## 2021-04-25 NOTE — ED Notes (Signed)
Pt ambulated from stretcher into bathroom. 2 staff assist. States she has a walker at home, discussed using it with pt.

## 2021-04-25 NOTE — ED Notes (Signed)
Pt assisted to the bathroom. Placed in wheelchair and wheeled into bathroom. Able to get from wheelchair to toilet easier and less painful than walking from stretcher to toilet.

## 2021-04-26 DIAGNOSIS — D649 Anemia, unspecified: Secondary | ICD-10-CM | POA: Diagnosis not present

## 2021-04-26 DIAGNOSIS — S129XXA Fracture of neck, unspecified, initial encounter: Secondary | ICD-10-CM | POA: Diagnosis not present

## 2021-04-26 DIAGNOSIS — S32592A Other specified fracture of left pubis, initial encounter for closed fracture: Secondary | ICD-10-CM

## 2021-04-26 LAB — BASIC METABOLIC PANEL
Anion gap: 9 (ref 5–15)
BUN: 18 mg/dL (ref 8–23)
CO2: 25 mmol/L (ref 22–32)
Calcium: 8.5 mg/dL — ABNORMAL LOW (ref 8.9–10.3)
Chloride: 100 mmol/L (ref 98–111)
Creatinine, Ser: 0.93 mg/dL (ref 0.44–1.00)
GFR, Estimated: >60 mL/min (ref >60)
Glucose, Bld: 132 mg/dL — ABNORMAL HIGH (ref 70–99)
Potassium: 4.2 mmol/L (ref 3.5–5.1)
Sodium: 134 mmol/L — ABNORMAL LOW (ref 135–145)

## 2021-04-26 LAB — CBC
HCT: 32.1 % — ABNORMAL LOW (ref 36.0–46.0)
Hemoglobin: 10.8 g/dL — ABNORMAL LOW (ref 12.0–15.0)
MCH: 31.2 pg (ref 26.0–34.0)
MCHC: 33.6 g/dL (ref 30.0–36.0)
MCV: 92.8 fL (ref 80.0–100.0)
Platelets: 233 10*3/uL (ref 150–400)
RBC: 3.46 MIL/uL — ABNORMAL LOW (ref 3.87–5.11)
RDW: 13 % (ref 11.5–15.5)
WBC: 10.9 10*3/uL — ABNORMAL HIGH (ref 4.0–10.5)
nRBC: 0 % (ref 0.0–0.2)

## 2021-04-26 LAB — SARS CORONAVIRUS 2 (TAT 6-24 HRS): SARS Coronavirus 2: NEGATIVE

## 2021-04-26 NOTE — ED Notes (Signed)
Report to jennifer, rn.  

## 2021-04-26 NOTE — ED Notes (Signed)
Pt on call bell to use restroom. Pt ambulated x1 assist to restroom. Upon sitting on bed, pt stated she was feeling sick and nauseous. This RN administered zofran and provided pt with emesis bag.

## 2021-04-26 NOTE — ED Notes (Signed)
Pt up to bedside commode with assist of ed tech. Pt urinated, hand sanitizer provided, pt assisted back to bed.

## 2021-04-26 NOTE — NC FL2 (Signed)
[place] LEVEL OF CARE SCREENING TOOL     IDENTIFICATION  Patient Name: Lori Wallace Birthdate: [birth year] Sex: female Admission Date (Current Location): 04/25/2021  [facility] and [place] Number:  Engineering geologist and Address:  [facility], [address] [place], [place] [address]      Provider Number: [id number]  Attending Physician Name and Address:  Wyvonnia Dusky, MD  Relative Name and Phone Number:  [name] (Niece)   [phone] [facility] [place] [facility])    Current Level of Care: SNF Recommended Level of Care: [place] Prior Approval Number:    Date Approved/Denied:   PASRR Number: [id number]  Discharge Plan: SNF    Current Diagnoses: Patient Active Problem List   Diagnosis Date Noted   Cervical spine fracture ([place]) 04/25/2021   Fall at home, initial encounter 04/25/2021   Fracture of pubic ramus, left, closed, initial encounter ([place]) 04/25/2021   Normocytic anemia 04/25/2021   Closed fracture of ramus of left pubis ([place])    Breast cancer, unspecified laterality 01/06/2016   Left breast mass 01/06/2016   Essential hypertension, benign 01/06/2016   Compression fracture 10/23/2014   Breast cancer ([place]) 12/03/2013   Osteoarthritis of left hip 08/01/2012    Orientation RESPIRATION BLADDER Height & Weight     Self, Time, Situation, Place  Normal Continent Weight: 165 lb (74.8 kg) Height:  5\' 10"  (177.8 cm)  BEHAVIORAL SYMPTOMS/MOOD NEUROLOGICAL BOWEL NUTRITION STATUS      Continent Diet  AMBULATORY STATUS COMMUNICATION OF NEEDS Skin   Limited Assist Verbally Normal                       Personal Care Assistance Level of Assistance  Bathing, Dressing, Feeding, Total care Bathing Assistance: Limited assistance Feeding assistance: Limited assistance Dressing Assistance: Limited assistance Total Care Assistance: Limited assistance   Functional Limitations Info  Sight, Hearing, Speech Sight Info:  Adequate Hearing Info: Adequate Speech Info: Adequate    SPECIAL CARE FACTORS FREQUENCY  OT (By licensed OT), PT (By licensed PT)     PT Frequency: 5X per week OT Frequency: 5X per week            Contractures Contractures Info: Not present    Additional Factors Info         Patient does not have COVID vaccines and/or booster         Current Medications (04/26/2021):  This is the current hospital active medication list Current Facility-Administered Medications  Medication Dose Route Frequency Provider Last Rate Last Admin   acetaminophen (TYLENOL) tablet 650 mg  650 mg Oral Q6H PRN Ivor Costa, MD       atenolol (TENORMIN) tablet 25 mg  25 mg Oral Daily Ivor Costa, MD   25 mg at 04/26/21 1016   cholecalciferol (VITAMIN D3) tablet 1,000 Units  1,000 Units Oral Daily Ivor Costa, MD   1,000 Units at 04/26/21 1016   enoxaparin (LOVENOX) injection 40 mg  40 mg Subcutaneous Q24H Ivor Costa, MD   40 mg at 04/25/21 2213   hydrALAZINE (APRESOLINE) injection 5 mg  5 mg Intravenous Q2H PRN Ivor Costa, MD       loratadine (CLARITIN) tablet 10 mg  10 mg Oral Daily PRN Ivor Costa, MD       methocarbamol (ROBAXIN) tablet 500 mg  500 mg Oral Q8H PRN Ivor Costa, MD       morphine 2 MG/ML injection 0.5 mg  0.5 mg Intravenous Q3H  PRN Ivor Costa, MD       ondansetron [facility]) injection 4 mg  4 mg Intravenous Q8H PRN Ivor Costa, MD   4 mg at 04/26/21 0334   oxyCODONE-acetaminophen (PERCOCET/ROXICET) 5-325 MG per tablet 1 tablet  1 tablet Oral Q4H PRN Ivor Costa, MD   1 tablet at 04/25/21 2212   polyvinyl alcohol (LIQUIFILM TEARS) 1.4 % ophthalmic solution 1 drop  1 drop Both Eyes QHS PRN Ivor Costa, MD       zinc sulfate capsule 220 mg  220 mg Oral Daily Ivor Costa, MD   220 mg at 04/26/21 1016   Current Outpatient Medications  Medication Sig Dispense Refill   atenolol (TENORMIN) 25 MG tablet Take 25 mg by mouth daily.     Calcium-Magnesium-Vitamin D (CALCIUM MAGNESIUM PO) Take 1 tablet by  mouth daily.     cholecalciferol (VITAMIN D) 1000 UNITS tablet Take 1,000 Units by mouth daily.     ibuprofen (ADVIL) 200 MG tablet Take 200 mg by mouth as needed.     loratadine (CLARITIN) 10 MG tablet Take 10 mg by mouth daily as needed for allergies.     Polyvinyl Alcohol-Povidone (REFRESH OP) Place 1 drop into both eyes as needed (dry eyes).      sodium chloride (OCEAN) 0.65 % SOLN nasal spray Place 1 spray into both nostrils as needed for congestion.     Zinc 50 MG TABS Take 50 mg by mouth daily.     meloxicam (MOBIC) 7.5 MG tablet Take 7.5 mg by mouth as needed for pain. (Patient not taking: Reported on 04/25/2021)     mupirocin ointment (BACTROBAN) 2 % Apply to affected area on right lower leg once a day with bandage changes until healed. (Patient not taking: Reported on 04/25/2021) 22 g 0   Omega 3 1000 MG CAPS Take by mouth. (Patient not taking: Reported on 04/25/2021)     Probiotic Product (PROBIOTIC DAILY PO) Take 1 tablet by mouth daily. (Patient not taking: Reported on 04/25/2021)       Discharge Medications: Please see discharge summary for a list of discharge medications.  Relevant Imaging Results:  Relevant Lab Results:   Additional Information SS# [id number]  Adelene Amas, LCSWA
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
## 2021-04-26 NOTE — Progress Notes (Signed)
PROGRESS NOTE    Lori Wallace  DUK:[id number] DOB: 1936/01/03 DOA: 04/25/2021 PCP: Derinda Late, MD    Assessment & Plan:   Principal Problem:   Cervical spine fracture [facility]) Active Problems:   Essential hypertension, benign   Fall at home, initial encounter   Fracture of pubic ramus, left, closed, initial encounter ([place])   Normocytic anemia  C spine fracture: secondary to a fall at home. Continue w/ cervical collar for immobilization and pt should f/u in 3-4 weeks in neuro surg clinic w/ Dr. Lacinda Axon. Morphine, percocet prn for pain   Fracture of left pubic ramus: secondary to fall at home. Weight bearing as tolerated as per ortho surg. PT/OT consulted. Robaxin prn for muscle spasms  HTN: continue on home dose of atenolol. IV hydralazine prn  Normocytic anemia: H&H are stable. No need for a transfusion   Leukocytosis: likely reactive. Will continue to monitor    DVT prophylaxis: lovenox  Code Status: full  Family Communication: discussed pt's care w/ pt's niece and answered her questions  Disposition Plan: depends on PT/OT recs   Level of care: Med-Surg  Status is: Observation  The patient remains OBS appropriate and will d/c before 2 midnights.  Dispo: The patient is from: Home              Anticipated d/c is to: SNF              Patient currently is not medically stable to d/c.   Difficult to place patient: unclear     Consultants:  Neurosurg   Procedures:   Antimicrobials:    Subjective: Pt c/o neck and hip pain   Objective: Vitals:   04/26/21 0330 04/26/21 0400 04/26/21 0430 04/26/21 0600  BP: (!) 151/54  (!) 137/50 (!) 127/57  Pulse: 60  (!) 55 (!) 54  Resp:  16 20   Temp:      TempSrc:      SpO2: 99%  96% 97%  Weight:      Height:       No intake or output data in the 24 hours ending 04/26/21 0809 Filed Weights   04/25/21 1057  Weight: 74.8 kg    Examination:  General exam: Appears calm and comfortable. Frail appearing  Respiratory  system: Clear to auscultation. Respiratory effort normal. Cardiovascular system: S1 & S2+. No  rubs, gallops or clicks. No pedal edema. Gastrointestinal system: Abdomen is nondistended, soft and nontender. Normal bowel sounds heard. Central nervous system: Alert and oriented. Moves all extremities  Psychiatry: Judgement and insight appear normal. Mood & affect appropriate.     Data Reviewed: I have personally reviewed following labs and imaging studies  CBC: Recent Labs  Lab 04/25/21 1101 04/26/21 0354  WBC 7.8 10.9*  HGB 11.0* 10.8*  HCT 34.4* 32.1*  MCV 95.0 92.8  PLT 267 376   Basic Metabolic Panel: Recent Labs  Lab 04/25/21 1101  NA 135  K 3.9  CL 104  CO2 25  GLUCOSE 112*  BUN 24*  CREATININE 0.98  CALCIUM 8.7*   GFR: Estimated Creatinine Clearance: 47.9 mL/min (by C-G formula based on SCr of 0.98 mg/dL). Liver Function Tests: No results for input(s): AST, ALT, ALKPHOS, BILITOT, PROT, ALBUMIN in the last 168 hours. No results for input(s): LIPASE, AMYLASE in the last 168 hours. No results for input(s): AMMONIA in the last 168 hours. Coagulation Profile: No results for input(s): INR, PROTIME in the last 168 hours. Cardiac Enzymes: No results for input(s): CKTOTAL,  CKMB, CKMBINDEX, TROPONINI in the last 168 hours. BNP (last 3 results) No results for input(s): PROBNP in the last 8760 hours. HbA1C: No results for input(s): HGBA1C in the last 72 hours. CBG: No results for input(s): GLUCAP in the last 168 hours. Lipid Profile: No results for input(s): CHOL, HDL, LDLCALC, TRIG, CHOLHDL, LDLDIRECT in the last 72 hours. Thyroid Function Tests: No results for input(s): TSH, T4TOTAL, FREET4, T3FREE, THYROIDAB in the last 72 hours. Anemia Panel: No results for input(s): VITAMINB12, FOLATE, FERRITIN, TIBC, IRON, RETICCTPCT in the last 72 hours. Sepsis Labs: No results for input(s): PROCALCITON, LATICACIDVEN in the last 168 hours.  Recent Results (from the past 240  hour(s))  SARS CORONAVIRUS 2 (TAT 6-24 HRS) Nasopharyngeal Nasopharyngeal Swab     Status: None   Collection Time: 04/25/21  2:26 PM   Specimen: Nasopharyngeal Swab  Result Value Ref Range Status   SARS Coronavirus 2 NEGATIVE NEGATIVE Final    Comment: (NOTE) SARS-CoV-2 target nucleic acids are NOT DETECTED.  The SARS-CoV-2 RNA is generally detectable in upper and lower respiratory specimens during the acute phase of infection. Negative results do not preclude SARS-CoV-2 infection, do not rule out co-infections with other pathogens, and should not be used as the sole basis for treatment or other patient management decisions. Negative results must be combined with clinical observations, patient history, and epidemiological information. The expected result is Negative.  Fact Sheet for Patients: SugarRoll.be  Fact Sheet for Healthcare Providers: https://www.woods-mathews.com/  This test is not yet approved or cleared by the [place] FDA and  has been authorized for detection and/or diagnosis of SARS-CoV-2 by FDA under an Emergency Use Authorization (EUA). This EUA will remain  in effect (meaning this test can be used) for the duration of the COVID-19 declaration under Se ction 564(b)(1) of the Act, 21 U.S.C. section 360bbb-3(b)(1), unless the authorization is terminated or revoked sooner.  Performed at [facility], [place] [address] [place], [place] [address]          Radiology Studies: CT Head Wo Contrast  Result Date: 04/25/2021 CLINICAL DATA:  85 year old female status post fall with pain. EXAM: CT HEAD WITHOUT CONTRAST TECHNIQUE: Contiguous axial images were obtained from the base of the skull through the vertex without intravenous contrast. COMPARISON:  Head CT 06/29/2016. FINDINGS: Brain: Cerebral volume is stable and within normal limits for age. No midline shift, ventriculomegaly, mass effect, evidence of mass lesion,  intracranial hemorrhage or evidence of cortically based acute infarction. Gray-white matter differentiation is within normal limits throughout the brain. Vascular: Calcified atherosclerosis at the skull base. No suspicious intracranial vascular hyperdensity. Skull: Stable.  No acute osseous abnormality identified. Sinuses/Orbits: Visualized paranasal sinuses and mastoids are clear. Other: No orbit or scalp soft tissue injury identified. IMPRESSION: No acute traumatic injury identified. Normal for age non contrast CT appearance of the brain. Electronically Signed   By: Genevie Ann M.D.   On: 04/25/2021 11:55   CT Cervical Spine Wo Contrast  Addendum Date: 04/25/2021   ADDENDUM REPORT: 04/25/2021 12:13 ADDENDUM: Study discussed by telephone with Dr. Vladimir Crofts on 04/25/2021 at 12:13 . Electronically Signed   By: Genevie Ann M.D.   On: 04/25/2021 12:13   Result Date: 04/25/2021 CLINICAL DATA:  85 year old female status post fall with pain. EXAM: CT CERVICAL SPINE WITHOUT CONTRAST TECHNIQUE: Multidetector CT imaging of the cervical spine was performed without intravenous contrast. Multiplanar CT image reconstructions were also generated. COMPARISON:  Head CT today. FINDINGS: Alignment: Mild straightening  of cervical lordosis. Subtle degenerative appearing anterolisthesis of C7 on T1 with associated right facet arthropathy. Bilateral posterior element alignment is within normal limits. Skull base and vertebrae: Visualized skull base is intact. No atlanto-occipital dissociation. C1 and C2 appear intact with maintained alignment. Chronic C5-C6 interbody ankylosis, within in completed horizontal fracture through the left aspect of the fused disc space. See series 7, image 25 and sagittal images 34 through 38. The fracture extends toward the left C6 neural foramen which is stenotic due to bony overgrowth. C5 pedicles and posterior elements appear to remain intact and normally aligned. Questionable fracture lucency through the  right disc space on series 6, image 26, although lucency cannot be tracked all the way across the vertebral bodies. The other cervical vertebrae appear intact. No other acute osseous abnormality identified. Soft tissues and spinal canal: No prevertebral fluid or swelling. No visible canal hematoma. Moderate calcified carotid atherosclerosis on the left. Disc levels: Severe C1-C2 joint space loss and degeneration, including bulky partially calcified ligamentous hypertrophy and posterior right C1 osteophytosis which results in at least mild C1 level spinal stenosis. Ankylosis of the C2-C3 posterior elements. Disc space loss with leftward endplate spurring and moderate to severe right facet hypertrophy at C3-C4. Bulky is a circumferential disc and endplate degeneration at C4-C5 eccentric to the right. Partial fracture through the C5-C6 chronic interbody ankylosis as detailed above. Bulky circumferential disc and endplate degeneration at C6-C7. Mild degenerative anterolisthesis at C7-T1 with facet degeneration greater on the right. Subsequent mild to moderate degenerative spinal stenosis at C4-C5, mild at C6-C7. Upper chest: Visible upper thoracic levels appear intact. Apical lung scarring. IMPRESSION: 1. Chronically ankylosed C5-C6 vertebral bodies with an unusual, incompleted horizontal fracture through the left aspect of the fusion. No displacement. The posterior elements appear to remain intact. Recommend Spine Surgery consultation. 2. No other acute traumatic injury identified in the cervical spine, with ankylosis also at C2-C3. And severe adjacent segment disease at C1-C2, C4-C5, and C6-C7 with spinal stenosis at each level. Electronically Signed: By: Genevie Ann M.D. On: 04/25/2021 12:05   DG Hip Unilat With Pelvis 2-3 Views Left  Result Date: 04/25/2021 CLINICAL DATA:  85 year old female status post fall with pain. EXAM: DG HIP (WITH OR WITHOUT PELVIS) 2-3V LEFT COMPARISON:  Abdominal radiographs 09/24/2014.  Pelvis radiograph 07/30/2012. FINDINGS: Chronic left bipolar hip arthroplasty. Hardware appears intact and normally located. Proximal left femur appears intact but there are comminuted fractures of the left superior pubic ramus at the junction with the acetabulum and left medial inferior pubic ramus. Mild displacement. Pubic symphysis remains aligned. Right femoral head normally located and proximal right femur grossly intact. No other pelvic fracture identified. Nonobstructed visible bowel gas pattern with retained stool. IMPRESSION: 1. Comminuted fractures of the left pubic rami, including at the junction with the acetabulum. 2. Intact left hip arthroplasty. Electronically Signed   By: Genevie Ann M.D.   On: 04/25/2021 11:52        Scheduled Meds:  atenolol  25 mg Oral Daily   cholecalciferol  1,000 Units Oral Daily   enoxaparin (LOVENOX) injection  40 mg Subcutaneous Q24H   zinc sulfate  220 mg Oral Daily   Continuous Infusions:   LOS: 0 days    Time spent: 33 mins    Wyvonnia Dusky, MD [facility] Pager [phone]  If 7PM-7AM, please contact night-coverage 04/26/2021, 8:09 AM
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
## 2021-04-27 ENCOUNTER — Encounter: Payer: Self-pay | Admitting: Internal Medicine

## 2021-04-27 DIAGNOSIS — S129XXA Fracture of neck, unspecified, initial encounter: Secondary | ICD-10-CM | POA: Diagnosis not present

## 2021-04-27 DIAGNOSIS — D649 Anemia, unspecified: Secondary | ICD-10-CM | POA: Diagnosis not present

## 2021-04-27 DIAGNOSIS — S32592A Other specified fracture of left pubis, initial encounter for closed fracture: Secondary | ICD-10-CM | POA: Diagnosis not present

## 2021-04-27 LAB — BASIC METABOLIC PANEL
Anion gap: 6 (ref 5–15)
BUN: 19 mg/dL (ref 8–23)
CO2: 29 mmol/L (ref 22–32)
Calcium: 8.8 mg/dL — ABNORMAL LOW (ref 8.9–10.3)
Chloride: 102 mmol/L (ref 98–111)
Creatinine, Ser: 0.89 mg/dL (ref 0.44–1.00)
GFR, Estimated: >60 mL/min (ref >60)
Glucose, Bld: 108 mg/dL — ABNORMAL HIGH (ref 70–99)
Potassium: 4.4 mmol/L (ref 3.5–5.1)
Sodium: 137 mmol/L (ref 135–145)

## 2021-04-27 LAB — CBC
HCT: 32.7 % — ABNORMAL LOW (ref 36.0–46.0)
Hemoglobin: 10.8 g/dL — ABNORMAL LOW (ref 12.0–15.0)
MCH: 31.2 pg (ref 26.0–34.0)
MCHC: 33 g/dL (ref 30.0–36.0)
MCV: 94.5 fL (ref 80.0–100.0)
Platelets: 230 10*3/uL (ref 150–400)
RBC: 3.46 MIL/uL — ABNORMAL LOW (ref 3.87–5.11)
RDW: 13.2 % (ref 11.5–15.5)
WBC: 10.4 10*3/uL (ref 4.0–10.5)
nRBC: 0 % (ref 0.0–0.2)

## 2021-04-27 MED ORDER — DOCUSATE SODIUM 100 MG PO CAPS
200.0000 mg | ORAL_CAPSULE | Freq: Two times a day (BID) | ORAL | Status: DC
  Administered 2021-04-27 – 2021-04-28 (×3): 200 mg via ORAL
  Filled 2021-04-27 (×3): qty 2

## 2021-04-27 NOTE — Progress Notes (Signed)
PROGRESS NOTE   HPI was taken from Dr. Blaine Hamper:  Lori Wallace is a 85 y.o. female with medical history significant of hypertension, skin cancer, breast cancer (s/p 04 bilateral lumpectomy), who presents with fall, neck pain and left hip pain.   Patient states that she accidentally tripped her steps and fell when she was getting ready for church at 9:20 AM.  Denies loss of consciousness.  Patient injured her neck and left hip, causing pain in left hip and her neck.  She states the left hip is more painful than neck.  The pain left hip is constant, sharp, severe, nonradiating.  No leg numbness. Patient denies chest pain, cough, shortness of breath no fever or chills.  Denies nausea, vomiting, diarrhea or abdominal pain.  No symptoms of UTI.   ED Course: pt was found to have WBC 7.8, pending COVID-19 PCR, electrolytes renal function okay, temperature normal, blood pressure 147/68, heart rate 59, RR 16, oxygen saturation 96% on room air.  CT of head is negative for acute intracranial abnormalities.  Patient is placed on MedSurg bed for observation.  Dr. Lacinda Axon of neurosurgery is consulted.  ED physician discussed with Dr. Mack Guise of orthopedic surgery on the phone.   X-ray of left hip/pelvis: 1. Comminuted fractures of the left pubic rami, including at the junction with the acetabulum. 2. Intact left hip arthroplasty.   CT- C spin: 1. Chronically ankylosed C5-C6 vertebral bodies with an unusual, incompleted horizontal fracture through the left aspect of the fusion. No displacement. The posterior elements appear to remain intact. Recommend Spine Surgery consultation.   2. No other acute traumatic injury identified in the cervical spine, with ankylosis also at C2-C3. And severe adjacent segment disease at C1-C2, C4-C5, and C6-C7 with spinal stenosis at each level.  Hospital course from Dr. Jimmye Norman 6/20-6/21/22: Pt was found to have C spine fracture and fracture of left pubic ramus secondary to a  fall at home. Pt should continue w/ cervical collar and should f/u w/ neuro surg, Dr. Lacinda Axon in 3-4 weeks. Also, pt should f/u w/ ortho surg, Dr. Tanja Port, for the left pubic ramus fracture outpatient. PT/OT consulted and recs SNF. Waiting on SNF placement   Lori Wallace  GLO:[id number] DOB: 09-26-1936 DOA: 04/25/2021 PCP: Derinda Late, MD    Assessment & Plan:   Principal Problem:   Cervical spine fracture [facility]) Active Problems:   Essential hypertension, benign   Fall at home, initial encounter   Fracture of pubic ramus, left, closed, initial encounter ([employer])   Normocytic anemia  C spine fracture: secondary to a fall at home. Continue w/ cervical collar for immobilization and pt should f/u in 3-4 weeks in neuro surg clinic w/ Dr. Lacinda Axon. Morphine, percocet prn for pain   Fracture of left pubic ramus: secondary to fall at home. Weight bearing as tolerated as per ortho surg. PT/OT recs SNF    HTN: continue on home dose of atenolol. IV hydralazine prn  Normocytic anemia: H&H are stable. No need for a transfusion currently    Leukocytosis: resolved    DVT prophylaxis: lovenox  Code Status: full  Family Communication: discussed pt's care w/ pt's niece and answered her questions  Disposition Plan: likely d/c SNF vs HH  Level of care: Med-Surg  Status is: Observation  The patient remains OBS appropriate and will d/c before 2 midnights.  Dispo: The patient is from: Home              Anticipated d/c is to:  SNF              Patient currently is not medically stable to d/c.   Difficult to place patient: unclear     Consultants:  Neurosurg   Procedures:   Antimicrobials:    Subjective: Pt c/o neck pain intermittently   Objective: Vitals:   04/26/21 1830 04/26/21 2057 04/27/21 0037 04/27/21 0427  BP: (!) 157/67 (!) 131/58 124/67 124/63  Pulse: 74 67 64 (!) 56  Resp: 18 18 18 16   Temp:  98.1 F (36.7 C) 98.5 F (36.9 C) 98 F (36.7 C)  TempSrc:  Oral Oral Oral   SpO2: 96% 100% 97% 98%  Weight:      Height:        Intake/Output Summary (Last 24 hours) at 04/27/2021 0738 Last data filed at 04/27/2021 0431 Gross per 24 hour  Intake --  Output 0 ml  Net 0 ml   Filed Weights   04/25/21 1057  Weight: 74.8 kg    Examination:  General exam: Appears comfortable. Frail appearing  Respiratory system: Clear breath sounds b/l  Cardiovascular system: S1/S2+. No rubs or clicks  Gastrointestinal system: Abd is soft, NT, ND & hypoactive bowel sounds  Central nervous system: Alert and oriented. Moves all extremities  Psychiatry: judgement and insight appear normal. Appropriate mood and affect     Data Reviewed: I have personally reviewed following labs and imaging studies  CBC: Recent Labs  Lab 04/25/21 1101 04/26/21 0354 04/27/21 0620  WBC 7.8 10.9* 10.4  HGB 11.0* 10.8* 10.8*  HCT 34.4* 32.1* 32.7*  MCV 95.0 92.8 94.5  PLT 267 233 945   Basic Metabolic Panel: Recent Labs  Lab 04/25/21 1101 04/26/21 1015 04/27/21 0620  NA 135 134* 137  K 3.9 4.2 4.4  CL 104 100 102  CO2 25 25 29   GLUCOSE 112* 132* 108*  BUN 24* 18 19  CREATININE 0.98 0.93 0.89  CALCIUM 8.7* 8.5* 8.8*   GFR: Estimated Creatinine Clearance: 52.7 mL/min (by C-G formula based on SCr of 0.89 mg/dL). Liver Function Tests: No results for input(s): AST, ALT, ALKPHOS, BILITOT, PROT, ALBUMIN in the last 168 hours. No results for input(s): LIPASE, AMYLASE in the last 168 hours. No results for input(s): AMMONIA in the last 168 hours. Coagulation Profile: No results for input(s): INR, PROTIME in the last 168 hours. Cardiac Enzymes: No results for input(s): CKTOTAL, CKMB, CKMBINDEX, TROPONINI in the last 168 hours. BNP (last 3 results) No results for input(s): PROBNP in the last 8760 hours. HbA1C: No results for input(s): HGBA1C in the last 72 hours. CBG: No results for input(s): GLUCAP in the last 168 hours. Lipid Profile: No results for input(s): CHOL, HDL,  LDLCALC, TRIG, CHOLHDL, LDLDIRECT in the last 72 hours. Thyroid Function Tests: No results for input(s): TSH, T4TOTAL, FREET4, T3FREE, THYROIDAB in the last 72 hours. Anemia Panel: No results for input(s): VITAMINB12, FOLATE, FERRITIN, TIBC, IRON, RETICCTPCT in the last 72 hours. Sepsis Labs: No results for input(s): PROCALCITON, LATICACIDVEN in the last 168 hours.  Recent Results (from the past 240 hour(s))  SARS CORONAVIRUS 2 (TAT 6-24 HRS) Nasopharyngeal Nasopharyngeal Swab     Status: None   Collection Time: 04/25/21  2:26 PM   Specimen: Nasopharyngeal Swab  Result Value Ref Range Status   SARS Coronavirus 2 NEGATIVE NEGATIVE Final    Comment: (NOTE) SARS-CoV-2 target nucleic acids are NOT DETECTED.  The SARS-CoV-2 RNA is generally detectable in upper and lower respiratory specimens during the acute  phase of infection. Negative results do not preclude SARS-CoV-2 infection, do not rule out co-infections with other pathogens, and should not be used as the sole basis for treatment or other patient management decisions. Negative results must be combined with clinical observations, patient history, and epidemiological information. The expected result is Negative.  Fact Sheet for Patients: SugarRoll.be  Fact Sheet for Healthcare Providers: https://www.woods-mathews.com/  This test is not yet approved or cleared by the [place] FDA and  has been authorized for detection and/or diagnosis of SARS-CoV-2 by FDA under an Emergency Use Authorization (EUA). This EUA will remain  in effect (meaning this test can be used) for the duration of the COVID-19 declaration under Se ction 564(b)(1) of the Act, 21 U.S.C. section 360bbb-3(b)(1), unless the authorization is terminated or revoked sooner.  Performed at [facility], [place] [address], [place],  [address]          Radiology Studies: CT Head Wo Contrast  Result Date:  04/25/2021 CLINICAL DATA:  85 year old female status post fall with pain. EXAM: CT HEAD WITHOUT CONTRAST TECHNIQUE: Contiguous axial images were obtained from the base of the skull through the vertex without intravenous contrast. COMPARISON:  Head CT 06/29/2016. FINDINGS: Brain: Cerebral volume is stable and within normal limits for age. No midline shift, ventriculomegaly, mass effect, evidence of mass lesion, intracranial hemorrhage or evidence of cortically based acute infarction. Gray-white matter differentiation is within normal limits throughout the brain. Vascular: Calcified atherosclerosis at the skull base. No suspicious intracranial vascular hyperdensity. Skull: Stable.  No acute osseous abnormality identified. Sinuses/Orbits: Visualized paranasal sinuses and mastoids are clear. Other: No orbit or scalp soft tissue injury identified. IMPRESSION: No acute traumatic injury identified. Normal for age non contrast CT appearance of the brain. Electronically Signed   By: Genevie Ann M.D.   On: 04/25/2021 11:55   CT Cervical Spine Wo Contrast  Addendum Date: 04/25/2021   ADDENDUM REPORT: 04/25/2021 12:13 ADDENDUM: Study discussed by telephone with Dr. Vladimir Crofts on 04/25/2021 at 12:13 . Electronically Signed   By: Genevie Ann M.D.   On: 04/25/2021 12:13   Result Date: 04/25/2021 CLINICAL DATA:  85 year old female status post fall with pain. EXAM: CT CERVICAL SPINE WITHOUT CONTRAST TECHNIQUE: Multidetector CT imaging of the cervical spine was performed without intravenous contrast. Multiplanar CT image reconstructions were also generated. COMPARISON:  Head CT today. FINDINGS: Alignment: Mild straightening of cervical lordosis. Subtle degenerative appearing anterolisthesis of C7 on T1 with associated right facet arthropathy. Bilateral posterior element alignment is within normal limits. Skull base and vertebrae: Visualized skull base is intact. No atlanto-occipital dissociation. C1 and C2 appear intact with maintained  alignment. Chronic C5-C6 interbody ankylosis, within in completed horizontal fracture through the left aspect of the fused disc space. See series 7, image 25 and sagittal images 34 through 38. The fracture extends toward the left C6 neural foramen which is stenotic due to bony overgrowth. C5 pedicles and posterior elements appear to remain intact and normally aligned. Questionable fracture lucency through the right disc space on series 6, image 26, although lucency cannot be tracked all the way across the vertebral bodies. The other cervical vertebrae appear intact. No other acute osseous abnormality identified. Soft tissues and spinal canal: No prevertebral fluid or swelling. No visible canal hematoma. Moderate calcified carotid atherosclerosis on the left. Disc levels: Severe C1-C2 joint space loss and degeneration, including bulky partially calcified ligamentous hypertrophy and posterior right C1 osteophytosis which results in at least mild C1 level  spinal stenosis. Ankylosis of the C2-C3 posterior elements. Disc space loss with leftward endplate spurring and moderate to severe right facet hypertrophy at C3-C4. Bulky is a circumferential disc and endplate degeneration at C4-C5 eccentric to the right. Partial fracture through the C5-C6 chronic interbody ankylosis as detailed above. Bulky circumferential disc and endplate degeneration at C6-C7. Mild degenerative anterolisthesis at C7-T1 with facet degeneration greater on the right. Subsequent mild to moderate degenerative spinal stenosis at C4-C5, mild at C6-C7. Upper chest: Visible upper thoracic levels appear intact. Apical lung scarring. IMPRESSION: 1. Chronically ankylosed C5-C6 vertebral bodies with an unusual, incompleted horizontal fracture through the left aspect of the fusion. No displacement. The posterior elements appear to remain intact. Recommend Spine Surgery consultation. 2. No other acute traumatic injury identified in the cervical spine, with  ankylosis also at C2-C3. And severe adjacent segment disease at C1-C2, C4-C5, and C6-C7 with spinal stenosis at each level. Electronically Signed: By: Genevie Ann M.D. On: 04/25/2021 12:05   DG Hip Unilat With Pelvis 2-3 Views Left  Result Date: 04/25/2021 CLINICAL DATA:  85 year old female status post fall with pain. EXAM: DG HIP (WITH OR WITHOUT PELVIS) 2-3V LEFT COMPARISON:  Abdominal radiographs 09/24/2014. Pelvis radiograph 07/30/2012. FINDINGS: Chronic left bipolar hip arthroplasty. Hardware appears intact and normally located. Proximal left femur appears intact but there are comminuted fractures of the left superior pubic ramus at the junction with the acetabulum and left medial inferior pubic ramus. Mild displacement. Pubic symphysis remains aligned. Right femoral head normally located and proximal right femur grossly intact. No other pelvic fracture identified. Nonobstructed visible bowel gas pattern with retained stool. IMPRESSION: 1. Comminuted fractures of the left pubic rami, including at the junction with the acetabulum. 2. Intact left hip arthroplasty. Electronically Signed   By: Genevie Ann M.D.   On: 04/25/2021 11:52        Scheduled Meds:  atenolol  25 mg Oral Daily   cholecalciferol  1,000 Units Oral Daily   enoxaparin (LOVENOX) injection  40 mg Subcutaneous Q24H   zinc sulfate  220 mg Oral Daily   Continuous Infusions:   LOS: 0 days    Time spent: 35 mins    Wyvonnia Dusky, MD [facility] Pager [phone]  If 7PM-7AM, please contact night-coverage 04/27/2021, 7:38 AM

## 2021-04-27 NOTE — TOC Progression Note (Signed)
Transition of Care [facility]) - CM/SW Discharge Note   Patient Details  Name: Lori Wallace MRN: [id number] Date of Birth: May 15, 1936  [facility] of [facility]) CM/SW Contact:  Su Hilt, RN Phone Number: 04/27/2021, 10:33 AM   Clinical Narrative:     Spoke with the patient and her niece Felecia in the room and reviewed the bed offers that were available, She requested to take time to look over each and she will let me know in a little while today of her choice, the patient has not been vaccinated and does not wish to be  Final next level of care: Home w Home Health Services Barriers to Discharge: Continued Medical Work up, SNF Pending bed offer, ED SNF auth   Patient Goals and CMS Choice Patient states their goals for this hospitalization and ongoing recovery are:: to return home CMS Medicare.gov Compare Post Acute Care list provided to:: Patient Choice offered to / list presented to : Adult Children [name] (Niece)   [phone] [facility]))  Discharge Placement                       Discharge Plan and Services In-house Referral: Clinical Social Work   Post Acute Care Choice: [place]                               Social Determinants of Health (SDOH) Interventions     Readmission Risk Interventions No flowsheet data found.

## 2021-04-27 NOTE — Progress Notes (Signed)
Physical Therapy Treatment Patient Details Name: Lori Wallace MRN: [id number] DOB: Jul 09, 1936 Today's Date: 04/27/2021    History of Present Illness 85 y.o. female with medical history significant of hypertension, skin cancer, breast cancer (s/p 04 bilateral lumpectomy), who presents with fall, neck pain and left hip pain. Work-up reveals to ipsilateral pubic rami fractures without evidence of hardware malfunction or repeat hip fracture.  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 No evidence of neurologic deficits. Neurosurgery and orthopedics, recommending nonoperative management. BLE WBAT per ortho, wear [place] J or Aspen collar per neurosurgery.    PT Comments    Patient received in bed, sister present, but left as session began. Patient is alert and agrees to get moving. She has left LE pain with all mobility. Requires min guard for supine to sit, min-mod assist for sit to stand transfer. Patient is able to ambulate from bed to [facility] and then around bed to recliner. Requires max cues for proper use of AD, sequencing, upright posture. She has very labored, painful gait. Patient will continue to benefit from skilled PT while here to improve functional independence, pain control and safety with mobility.       Follow Up Recommendations  SNF;Supervision for mobility/OOB     Equipment Recommendations  Other (comment) (TBD)    Recommendations for Other Services       Precautions / Restrictions Precautions Precautions: Fall;Cervical Precaution Comments: mod fall, cervical collar Required Braces or Orthoses: Cervical Brace Cervical Brace: Hard collar;At all times Restrictions Weight Bearing Restrictions: Yes RLE Weight Bearing: Weight bearing as tolerated LLE Weight Bearing: Weight bearing as tolerated Other Position/Activity Restrictions: WBAT per ortho    Mobility  Bed Mobility Overal bed mobility: Needs  Assistance Bed Mobility: Supine to Sit     Supine to sit: Min guard     General bed mobility comments: min guard, bed rails    Transfers Overall transfer level: Needs assistance Equipment used: Rolling walker (2 wheeled) Transfers: Sit to/from Stand Sit to Stand: Min assist         General transfer comment: requires constant cues for use of RW, posture, sequencing  Ambulation/Gait Ambulation/Gait assistance: Min Web designer (Feet): 15 Feet Assistive device: Rolling walker (2 wheeled) Gait Pattern/deviations: Step-to pattern;Decreased step length - right;Decreased step length - left;Decreased weight shift to left;Decreased stance time - left;Antalgic;Trunk flexed Gait velocity: decr   General Gait Details: patient requires cues for posture, sequencing and safe use of AD   Stairs             Wheelchair Mobility    Modified Rankin (Stroke Patients Only)       Balance Overall balance assessment: Needs assistance Sitting-balance support: Feet supported Sitting balance-Leahy Scale: Good Sitting balance - Comments: supervision for sitting   Standing balance support: Bilateral upper extremity supported;During functional activity Standing balance-Leahy Scale: Poor Standing balance comment: heavy reliance on RW                            Cognition Arousal/Alertness: Awake/alert Behavior During Therapy: WFL for tasks assessed/performed Overall Cognitive Status: Within Functional Limits for tasks assessed                                 General Comments: patient alert, oriented, follow direction fairly well      Exercises  Other Exercises Other Exercises: LE exercises; B LAQ, AP    General Comments        Pertinent Vitals/Pain Pain Assessment: Faces Faces Pain Scale: Hurts even more Pain Location: L hip Pain Descriptors / Indicators: Discomfort;Sore;Guarding;Grimacing Pain Intervention(s): Limited activity within  patient's tolerance;Monitored during session;Repositioned;Patient requesting pain meds-RN notified    Home Living                      Prior Function            PT Goals (current goals can now be found in the care plan section) Acute Rehab PT Goals Patient Stated Goal: to go to rehab prior to returning home, decrease pain PT Goal Formulation: With patient Time For Goal Achievement: 05/10/21 Potential to Achieve Goals: Good Progress towards PT goals: Progressing toward goals    Frequency    Min 2X/week      PT Plan Current plan remains appropriate    Co-evaluation              AM-PAC PT "6 Clicks" Mobility   Outcome Measure  Help needed turning from your back to your side while in a flat bed without using bedrails?: A Little Help needed moving from lying on your back to sitting on the side of a flat bed without using bedrails?: A Little Help needed moving to and from a bed to a chair (including a wheelchair)?: A Little Help needed standing up from a chair using your arms (e.g., wheelchair or bedside chair)?: A Little Help needed to walk in hospital room?: A Lot Help needed climbing 3-5 steps with a railing? : Total 6 Click Score: 15    End of Session Equipment Utilized During Treatment: Gait belt Activity Tolerance: Patient limited by pain Patient left: in chair;with call bell/phone within reach Nurse Communication: Mobility status PT Visit Diagnosis: Other abnormalities of gait and mobility (R26.89);Difficulty in walking, not elsewhere classified (R26.2);Pain;History of falling (Z91.81) Pain - Right/Left: Left Pain - part of body: Hip;Leg     Time: 3716-9678 PT Time Calculation (min) (ACUTE ONLY): 27 min  Charges:  $Gait Training: 8-22 mins $Therapeutic Activity: 8-22 mins                     Zyaire Dumas, PT, GCS 04/27/21,2:48 PM

## 2021-04-27 NOTE — TOC Progression Note (Signed)
Transition of Care [facility]) - Progression Note    Patient Details  Name: Lori Wallace MRN: [id number] Date of Birth: 09/13/1936  Transition of Care [facility]) CM/SW [place], RN Phone Number: 04/27/2021, 3:41 PM  Clinical Narrative:     Met with the patient to confirm bed choice she stated she would like to go to [facility] Started ins process   Expected Discharge Plan: [place] Barriers to Discharge: Continued Medical Work up, SNF Pending bed offer, ED SNF auth  Expected Discharge Plan and Services Expected Discharge Plan: [place] In-house Referral: Clinical Social Work   Post Acute Care Choice: [place] Living arrangements for the past 2 months: Single Family Home                                       Social Determinants of Health (SDOH) Interventions    Readmission Risk Interventions No flowsheet data found.

## 2021-04-27 NOTE — TOC Initial Note (Signed)
Transition of Care [facility]) - Initial/Assessment Note    Patient Details  Name: Lori Wallace MRN: [id number] Date of Birth: [birth year]  Transition of Care [facility]) CM/SW Contact:    Ova Freshwater Phone Number: [phone] 04/27/2021, 8:47 AM  Clinical Narrative:                  Patient presents to [facility] after fall at home.  Patient lives on her own.  Patient is independent w/ all ADLs. Patient's main contact is her niece Lori Wallace [phone].  CSW explained the SNF placement process and estimated timeline.  Patient's preferred SNF is [facility], and would like to remain in [place] if possible. CSW started SNF search.  Expected Discharge Plan: Skilled Nursing Facility Barriers to Discharge: Continued Medical Work up, SNF Pending bed offer, ED SNF auth   Patient Goals and CMS Choice Patient states their goals for this hospitalization and ongoing recovery are:: to return home CMS Medicare.gov Compare Post Acute Care list provided to:: Patient Choice offered to / list presented to : Adult Children Lori Wallace (Niece)   [phone] ([place]))  Expected Discharge Plan and Services Expected Discharge Plan: [place] In-house Referral: Clinical Social Work   Post Acute Care Choice: [place] Living arrangements for the past 2 months: [place]                                      Prior Living Arrangements/Services Living arrangements for the past 2 months: Single Family Home Lives with:: Self Patient language and need for interpreter reviewed:: Yes Do you feel safe going back to the place where you live?: Yes      Need for Family Participation in Patient Care: Yes (Comment)     Criminal Activity/Legal Involvement Pertinent to Current Situation/Hospitalization: No - Comment as needed  Activities of Daily Living Home Assistive Devices/Equipment: Bathtub lift ADL Screening (condition at time of admission) Patient's cognitive  ability adequate to safely complete daily activities?: No Is the patient deaf or have difficulty hearing?: No Does the patient have difficulty seeing, even when wearing glasses/contacts?: No Does the patient have difficulty concentrating, remembering, or making decisions?: No Patient able to express need for assistance with ADLs?: Yes Does the patient have difficulty dressing or bathing?: Yes Independently performs ADLs?: No Communication: Independent Dressing (OT): Needs assistance Is this a change from baseline?: Change from baseline, expected to last >3 days Grooming: Needs assistance Is this a change from baseline?: Change from baseline, expected to last >3 days Feeding: Independent Bathing: Needs assistance Is this a change from baseline?: Change from baseline, expected to last >3 days Toileting: Needs assistance Is this a change from baseline?: Change from baseline, expected to last >3days In/Out Bed: Needs assistance Is this a change from baseline?: Change from baseline, expected to last >3 days Walks in Home: Needs assistance Is this a change from baseline?: Change from baseline, expected to last >3 days Does the patient have difficulty walking or climbing stairs?: Yes Weakness of Legs: Both Weakness of Arms/Hands: None  Permission Sought/Granted Permission sought to share information with : Family Supports Permission granted to share information with : Yes, Verbal Permission Granted  Share Information with NAME: Yount,Felicia (Niece)   [phone] ([place])           Emotional Assessment Appearance:: Appears stated age Attitude/Demeanor/Rapport: Engaged Affect (typically observed): Stable Orientation: : Oriented to Self,  Oriented to Place, Oriented to  Time, Oriented to Situation Alcohol / Substance Use: Not Applicable Psych Involvement: No (comment)  Admission diagnosis:  Left hip pain [M25.552] Cervical spine fracture (HCC) [S12.9XXA] Fall, initial encounter  [id number]] Stress fracture of cervical vertebra, initial encounter [M48.42XA] Closed fracture of ramus of left pubis, initial encounter [facility]) [S32.592A] Patient Active Problem List   Diagnosis Date Noted   Cervical spine fracture ([place]) 04/25/2021   Fall at home, initial encounter 04/25/2021   Fracture of pubic ramus, left, closed, initial encounter ([place]) 04/25/2021   Normocytic anemia 04/25/2021   Closed fracture of ramus of left pubis ([place])    Breast cancer, unspecified laterality 01/06/2016   Left breast mass 01/06/2016   Essential hypertension, benign 01/06/2016   Compression fracture 10/23/2014   Breast cancer ([place]) 12/03/2013   Osteoarthritis of left hip 08/01/2012   PCP:  Derinda Late, MD Pharmacy:   [facility] [address] [place] [address] Phone: [phone] Fax: [place], [place] [address] Phone: [phone] Fax: [phone]     Social Determinants of Health (SDOH) Interventions    Readmission Risk Interventions No flowsheet data found.

## 2021-04-28 DIAGNOSIS — J309 Allergic rhinitis, unspecified: Secondary | ICD-10-CM | POA: Insufficient documentation

## 2021-04-28 DIAGNOSIS — E039 Hypothyroidism, unspecified: Secondary | ICD-10-CM | POA: Insufficient documentation

## 2021-04-28 DIAGNOSIS — M519 Unspecified thoracic, thoracolumbar and lumbosacral intervertebral disc disorder: Secondary | ICD-10-CM | POA: Insufficient documentation

## 2021-04-28 DIAGNOSIS — E7800 Pure hypercholesterolemia, unspecified: Secondary | ICD-10-CM | POA: Insufficient documentation

## 2021-04-28 DIAGNOSIS — K5900 Constipation, unspecified: Secondary | ICD-10-CM | POA: Insufficient documentation

## 2021-04-28 DIAGNOSIS — N3281 Overactive bladder: Secondary | ICD-10-CM | POA: Insufficient documentation

## 2021-04-28 DIAGNOSIS — W19XXXA Unspecified fall, initial encounter: Secondary | ICD-10-CM | POA: Insufficient documentation

## 2021-04-28 DIAGNOSIS — E559 Vitamin D deficiency, unspecified: Secondary | ICD-10-CM | POA: Insufficient documentation

## 2021-04-28 DIAGNOSIS — C4490 Unspecified malignant neoplasm of skin, unspecified: Secondary | ICD-10-CM | POA: Insufficient documentation

## 2021-04-28 DIAGNOSIS — F17200 Nicotine dependence, unspecified, uncomplicated: Secondary | ICD-10-CM | POA: Insufficient documentation

## 2021-04-28 DIAGNOSIS — L570 Actinic keratosis: Secondary | ICD-10-CM | POA: Insufficient documentation

## 2021-04-28 DIAGNOSIS — S129XXA Fracture of neck, unspecified, initial encounter: Secondary | ICD-10-CM | POA: Diagnosis not present

## 2021-04-28 DIAGNOSIS — Z4733 Aftercare following explantation of knee joint prosthesis: Secondary | ICD-10-CM | POA: Insufficient documentation

## 2021-04-28 DIAGNOSIS — S32509A Unspecified fracture of unspecified pubis, initial encounter for closed fracture: Secondary | ICD-10-CM

## 2021-04-28 DIAGNOSIS — G2581 Restless legs syndrome: Secondary | ICD-10-CM | POA: Insufficient documentation

## 2021-04-28 DIAGNOSIS — M6530 Trigger finger, unspecified finger: Secondary | ICD-10-CM | POA: Insufficient documentation

## 2021-04-28 DIAGNOSIS — H04129 Dry eye syndrome of unspecified lacrimal gland: Secondary | ICD-10-CM | POA: Insufficient documentation

## 2021-04-28 HISTORY — DX: Aftercare following explantation of knee joint prosthesis: Z47.33

## 2021-04-28 HISTORY — DX: Unspecified fracture of unspecified pubis, initial encounter for closed fracture: S32.509A

## 2021-04-28 LAB — BASIC METABOLIC PANEL
Anion gap: 5 (ref 5–15)
BUN: 20 mg/dL (ref 8–23)
CO2: 28 mmol/L (ref 22–32)
Calcium: 8.6 mg/dL — ABNORMAL LOW (ref 8.9–10.3)
Chloride: 102 mmol/L (ref 98–111)
Creatinine, Ser: 0.74 mg/dL (ref 0.44–1.00)
GFR, Estimated: >60 mL/min (ref >60)
Glucose, Bld: 125 mg/dL — ABNORMAL HIGH (ref 70–99)
Potassium: 4.3 mmol/L (ref 3.5–5.1)
Sodium: 135 mmol/L (ref 135–145)

## 2021-04-28 LAB — CBC
HCT: 33.2 % — ABNORMAL LOW (ref 36.0–46.0)
Hemoglobin: 11 g/dL — ABNORMAL LOW (ref 12.0–15.0)
MCH: 31.1 pg (ref 26.0–34.0)
MCHC: 33.1 g/dL (ref 30.0–36.0)
MCV: 93.8 fL (ref 80.0–100.0)
Platelets: 249 10*3/uL (ref 150–400)
RBC: 3.54 MIL/uL — ABNORMAL LOW (ref 3.87–5.11)
RDW: 13 % (ref 11.5–15.5)
WBC: 11.3 10*3/uL — ABNORMAL HIGH (ref 4.0–10.5)
nRBC: 0 % (ref 0.0–0.2)

## 2021-04-28 LAB — RESP PANEL BY RT-PCR (FLU A&B, COVID) ARPGX2
Influenza A by PCR: NEGATIVE
Influenza B by PCR: NEGATIVE
SARS Coronavirus 2 by RT PCR: NEGATIVE

## 2021-04-28 MED ORDER — MAGNESIUM CITRATE PO SOLN
1.0000 | Freq: Once | ORAL | Status: DC
  Filled 2021-04-28: qty 296

## 2021-04-28 MED ORDER — DOCUSATE SODIUM 100 MG PO CAPS: 200.0000 mg | ORAL_CAPSULE | Freq: Two times a day (BID) | ORAL | 0 refills | Status: DC

## 2021-04-28 MED ORDER — BISACODYL 10 MG RE SUPP
10.0000 mg | Freq: Once | RECTAL | Status: AC
  Administered 2021-04-28: 10 mg via RECTAL
  Filled 2021-04-28: qty 1

## 2021-04-28 MED ORDER — OXYCODONE-ACETAMINOPHEN 5-325 MG PO TABS: 1.0000 | ORAL_TABLET | ORAL | 0 refills | Status: DC | PRN

## 2021-04-28 NOTE — Discharge Summary (Addendum)
Lori Wallace:[id number] DOB: 1935/12/21 DOA: 04/25/2021  PCP: Derinda Late, MD  Admit date: 04/25/2021 Discharge date: 04/28/2021  Time spent: 35 minutes  Recommendations for Outpatient Follow-up:  Neurosurgery f/u 3-4 weeks Orthopedics f/u 3-4 weeks     Discharge Diagnoses:  Principal Problem:   Cervical spine fracture [facility]) Active Problems:   Essential hypertension, benign   Fall at home, initial encounter   Fracture of pubic ramus, left, closed, initial encounter ([place])   Normocytic anemia   Discharge Condition: fair  Diet recommendation: regular  Filed Weights   04/25/21 1057  Weight: 74.8 kg    History of present illness:  Lori Wallace is a 85 y.o. female with medical history significant of hypertension, skin cancer, breast cancer (s/p 04 bilateral lumpectomy), who presents with fall, neck pain and left hip pain.   Patient states that she accidentally tripped her steps and fell when she was getting ready for church at 9:20 AM.  Denies loss of consciousness.  Patient injured her neck and left hip, causing pain in left hip and her neck.  She states the left hip is more painful than neck.  The pain left hip is constant, sharp, severe, nonradiating.  No leg numbness. Patient denies chest pain, cough, shortness of breath no fever or chills.  Denies nausea, vomiting, diarrhea or abdominal pain.  No symptoms of UTI.  Hospital Course:  Imaging revealed c spine fracture as well as pubic ramus fracture. For the c spine nondisplaced fracture neurosurgery (Dr. Lacinda Axon) was consulted who advised miami j collar and f/u with him in 3-4 weeks. Dr. Mack Guise of ortho advised WBAT for pubic ramus fracture. Evaluated by PT and SNF advised, discharged there.  Procedures: none   Consultations: neurosurgery  Discharge Exam: Vitals:   04/28/21 0554 04/28/21 0800  BP: (!) 150/66 (!) 171/74  Pulse: (!) 58 (!) 59  Resp: 16 17  Temp: 97.7 F (36.5 C) 98 F (36.7 C)  SpO2: 98% 99%     General exam: Appears comfortable. Frail appearing. In cervical collar. Respiratory system: Clear breath sounds b/l  Cardiovascular system: S1/S2+. No rubs or clicks  Gastrointestinal system: Abd is soft, NT, ND & hypoactive bowel sounds  Central nervous system: Alert and oriented. Moves all extremities  Psychiatry: judgement and insight appear normal. Appropriate mood and affect   Discharge Instructions   Discharge Instructions     Diet - low sodium heart healthy   Complete by: As directed    Increase activity slowly   Complete by: As directed       Allergies as of 04/28/2021       Reactions   Carrot [daucus Carota] Itching   States theres a list of 15 foods that make her itch, but she eats anyway   Demerol [meperidine] Nausea And Vomiting   Peanut-containing Drug Products Itching        Medication List     STOP taking these medications    meloxicam 7.5 MG tablet Commonly known as: MOBIC   mupirocin ointment 2 % Commonly known as: BACTROBAN   Omega 3 1000 MG Caps       TAKE these medications    atenolol 25 MG tablet Commonly known as: TENORMIN Take 25 mg by mouth daily.   CALCIUM MAGNESIUM PO Take 1 tablet by mouth daily.   cholecalciferol 1000 units tablet Commonly known as: VITAMIN D Take 1,000 Units by mouth daily.   docusate sodium 100 MG capsule Commonly known as: COLACE Take 2  capsules (200 mg total) by mouth 2 (two) times daily.   ibuprofen 200 MG tablet Commonly known as: ADVIL Take 200 mg by mouth as needed.   loratadine 10 MG tablet Commonly known as: CLARITIN Take 10 mg by mouth daily as needed for allergies.   oxyCODONE-acetaminophen 5-325 MG tablet Commonly known as: PERCOCET/ROXICET Take 1 tablet by mouth every 4 (four) hours as needed for moderate pain.   PROBIOTIC DAILY PO Take 1 tablet by mouth daily.   REFRESH OP Place 1 drop into both eyes as needed (dry eyes).   sodium chloride 0.65 % Soln nasal spray Commonly  known as: OCEAN Place 1 spray into both nostrils as needed for congestion.   Zinc 50 MG Tabs Take 50 mg by mouth daily.       Allergies  Allergen Reactions   Carrot [Daucus Carota] Itching    States theres a list of 15 foods that make her itch, but she eats anyway   Demerol [Meperidine] Nausea And Vomiting   Peanut-Containing Drug Products Itching    Contact information for after-discharge care     Destination     [facility] .   Service: Skilled Nursing Contact information: [place] [phone]                      The results of significant diagnostics from this hospitalization (including imaging, microbiology, ancillary and laboratory) are listed below for reference.    Significant Diagnostic Studies: CT Head Wo Contrast  Result Date: 04/25/2021 CLINICAL DATA:  85 year old female status post fall with pain. EXAM: CT HEAD WITHOUT CONTRAST TECHNIQUE: Contiguous axial images were obtained from the base of the skull through the vertex without intravenous contrast. COMPARISON:  Head CT 06/29/2016. FINDINGS: Brain: Cerebral volume is stable and within normal limits for age. No midline shift, ventriculomegaly, mass effect, evidence of mass lesion, intracranial hemorrhage or evidence of cortically based acute infarction. Gray-white matter differentiation is within normal limits throughout the brain. Vascular: Calcified atherosclerosis at the skull base. No suspicious intracranial vascular hyperdensity. Skull: Stable.  No acute osseous abnormality identified. Sinuses/Orbits: Visualized paranasal sinuses and mastoids are clear. Other: No orbit or scalp soft tissue injury identified. IMPRESSION: No acute traumatic injury identified. Normal for age non contrast CT appearance of the brain. Electronically Signed   By: Genevie Ann M.D.   On: 04/25/2021 11:55   CT  Cervical Spine Wo Contrast  Addendum Date: 04/25/2021   ADDENDUM REPORT: 04/25/2021 12:13 ADDENDUM: Study discussed by telephone with Dr. Vladimir Crofts on 04/25/2021 at 12:13 . Electronically Signed   By: Genevie Ann M.D.   On: 04/25/2021 12:13   Result Date: 04/25/2021 CLINICAL DATA:  85 year old female status post fall with pain. EXAM: CT CERVICAL SPINE WITHOUT CONTRAST TECHNIQUE: Multidetector CT imaging of the cervical spine was performed without intravenous contrast. Multiplanar CT image reconstructions were also generated. COMPARISON:  Head CT today. FINDINGS: Alignment: Mild straightening of cervical lordosis. Subtle degenerative appearing anterolisthesis of C7 on T1 with associated right facet arthropathy. Bilateral posterior element alignment is within normal limits. Skull base and vertebrae: Visualized skull base is intact. No atlanto-occipital dissociation. C1 and C2 appear intact with maintained alignment. Chronic C5-C6 interbody ankylosis, within in completed horizontal fracture through the left aspect of the fused disc space. See series 7, image 25 and sagittal images 34 through 38. The fracture extends toward the left C6  neural foramen which is stenotic due to bony overgrowth. C5 pedicles and posterior elements appear to remain intact and normally aligned. Questionable fracture lucency through the right disc space on series 6, image 26, although lucency cannot be tracked all the way across the vertebral bodies. The other cervical vertebrae appear intact. No other acute osseous abnormality identified. Soft tissues and spinal canal: No prevertebral fluid or swelling. No visible canal hematoma. Moderate calcified carotid atherosclerosis on the left. Disc levels: Severe C1-C2 joint space loss and degeneration, including bulky partially calcified ligamentous hypertrophy and posterior right C1 osteophytosis which results in at least mild C1 level spinal stenosis. Ankylosis of the C2-C3 posterior elements. Disc  space loss with leftward endplate spurring and moderate to severe right facet hypertrophy at C3-C4. Bulky is a circumferential disc and endplate degeneration at C4-C5 eccentric to the right. Partial fracture through the C5-C6 chronic interbody ankylosis as detailed above. Bulky circumferential disc and endplate degeneration at C6-C7. Mild degenerative anterolisthesis at C7-T1 with facet degeneration greater on the right. Subsequent mild to moderate degenerative spinal stenosis at C4-C5, mild at C6-C7. Upper chest: Visible upper thoracic levels appear intact. Apical lung scarring. IMPRESSION: 1. Chronically ankylosed C5-C6 vertebral bodies with an unusual, incompleted horizontal fracture through the left aspect of the fusion. No displacement. The posterior elements appear to remain intact. Recommend Spine Surgery consultation. 2. No other acute traumatic injury identified in the cervical spine, with ankylosis also at C2-C3. And severe adjacent segment disease at C1-C2, C4-C5, and C6-C7 with spinal stenosis at each level. Electronically Signed: By: Genevie Ann M.D. On: 04/25/2021 12:05   DG Hip Unilat With Pelvis 2-3 Views Left  Result Date: 04/25/2021 CLINICAL DATA:  85 year old female status post fall with pain. EXAM: DG HIP (WITH OR WITHOUT PELVIS) 2-3V LEFT COMPARISON:  Abdominal radiographs 09/24/2014. Pelvis radiograph 07/30/2012. FINDINGS: Chronic left bipolar hip arthroplasty. Hardware appears intact and normally located. Proximal left femur appears intact but there are comminuted fractures of the left superior pubic ramus at the junction with the acetabulum and left medial inferior pubic ramus. Mild displacement. Pubic symphysis remains aligned. Right femoral head normally located and proximal right femur grossly intact. No other pelvic fracture identified. Nonobstructed visible bowel gas pattern with retained stool. IMPRESSION: 1. Comminuted fractures of the left pubic rami, including at the junction with  the acetabulum. 2. Intact left hip arthroplasty. Electronically Signed   By: Genevie Ann M.D.   On: 04/25/2021 11:52    Microbiology: Recent Results (from the past 240 hour(s))  SARS CORONAVIRUS 2 (TAT 6-24 HRS) Nasopharyngeal Nasopharyngeal Swab     Status: None   Collection Time: 04/25/21  2:26 PM   Specimen: Nasopharyngeal Swab  Result Value Ref Range Status   SARS Coronavirus 2 NEGATIVE NEGATIVE Final    Comment: (NOTE) SARS-CoV-2 target nucleic acids are NOT DETECTED.  The SARS-CoV-2 RNA is generally detectable in upper and lower respiratory specimens during the acute phase of infection. Negative results do not preclude SARS-CoV-2 infection, do not rule out co-infections with other pathogens, and should not be used as the sole basis for treatment or other patient management decisions. Negative results must be combined with clinical observations, patient history, and epidemiological information. The expected result is Negative.  Fact Sheet for Patients: SugarRoll.be  Fact Sheet for Healthcare Providers: https://www.woods-mathews.com/  This test is not yet approved or cleared by the [place] FDA and  has been authorized for detection and/or diagnosis of SARS-CoV-2 by FDA under an Emergency Use  Authorization (EUA). This EUA will remain  in effect (meaning this test can be used) for the duration of the COVID-19 declaration under Se ction 564(b)(1) of the Act, 21 U.S.C. section 360bbb-3(b)(1), unless the authorization is terminated or revoked sooner.  Performed at [facility], [place] [address], [place], [place] [address]      Labs: Basic Metabolic Panel: Recent Labs  Lab 04/25/21 1101 04/26/21 1015 04/27/21 0620 04/28/21 0528  NA 135 134* 137 135  K 3.9 4.2 4.4 4.3  CL 104 100 102 102  CO2 25 25 29 28   GLUCOSE 112* 132* 108* 125*  BUN 24* 18 19 20   CREATININE 0.98 0.93 0.89 0.74  CALCIUM 8.7* 8.5* 8.8* 8.6*   Liver  Function Tests: No results for input(s): AST, ALT, ALKPHOS, BILITOT, PROT, ALBUMIN in the last 168 hours. No results for input(s): LIPASE, AMYLASE in the last 168 hours. No results for input(s): AMMONIA in the last 168 hours. CBC: Recent Labs  Lab 04/25/21 1101 04/26/21 0354 04/27/21 0620 04/28/21 0528  WBC 7.8 10.9* 10.4 11.3*  HGB 11.0* 10.8* 10.8* 11.0*  HCT 34.4* 32.1* 32.7* 33.2*  MCV 95.0 92.8 94.5 93.8  PLT 267 233 230 249   Cardiac Enzymes: No results for input(s): CKTOTAL, CKMB, CKMBINDEX, TROPONINI in the last 168 hours. BNP: BNP (last 3 results) No results for input(s): BNP in the last 8760 hours.  ProBNP (last 3 results) No results for input(s): PROBNP in the last 8760 hours.  CBG: No results for input(s): GLUCAP in the last 168 hours.     Signed:  Desma Maxim MD.  [facility] 04/28/2021, 10:55 AM

## 2021-04-28 NOTE — Progress Notes (Signed)
Physical Therapy Treatment Patient Details Name: Lori Wallace MRN: [id number] DOB: August 02, 1936 Today's Date: 04/28/2021    History of Present Illness 85 y.o. female with medical history significant of hypertension, skin cancer, breast cancer (s/p 04 bilateral lumpectomy), who presents with fall, neck pain and left hip pain. Work-up reveals to ipsilateral pubic rami fractures without evidence of hardware malfunction or repeat hip fracture.  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 No evidence of neurologic deficits. Neurosurgery and orthopedics, recommending nonoperative management. BLE WBAT per ortho, wear [place] J or Aspen collar per neurosurgery.    PT Comments    Patient received in bed, she reports yesterday was an awful day. She had a lot of pain, today seems better so far. Patient requires min guard for supine to sit. Min guard/supervision for sit to stand, min guard for ambulation of 15 feet. Cues for sequencing needed. Improved ability this session but continues to be very limited by pain and at increased fall risk. Patient will continue to benefit from skilled PT while here to improve functional independence, strength and safety with mobility.     Follow Up Recommendations  SNF;Supervision for mobility/OOB     Equipment Recommendations  None recommended by PT    Recommendations for Other Services       Precautions / Restrictions Precautions Precautions: Fall;Cervical Precaution Comments: mod fall, cervical collar Required Braces or Orthoses: Cervical Brace Cervical Brace: Hard collar;At all times Restrictions Weight Bearing Restrictions: Yes RLE Weight Bearing: Weight bearing as tolerated LLE Weight Bearing: Weight bearing as tolerated Other Position/Activity Restrictions: WBAT per ortho    Mobility  Bed Mobility Overal bed mobility: Needs Assistance Bed Mobility: Supine to Sit     Supine to sit: Min  guard     General bed mobility comments: min guard, bed rails    Transfers Overall transfer level: Needs assistance Equipment used: Rolling walker (2 wheeled) Transfers: Sit to/from Stand Sit to Stand: Supervision         General transfer comment: improved safety and ability this session.  Ambulation/Gait Ambulation/Gait assistance: Min guard Gait Distance (Feet): 15 Feet Assistive device: Rolling walker (2 wheeled) Gait Pattern/deviations: Step-to pattern;Decreased step length - right;Decreased step length - left;Decreased stance time - left;Decreased weight shift to left;Trunk flexed Gait velocity: decr   General Gait Details: decreased need for cues this session. Improved ability, reports decreased pain in L LE if puts weight through toes only.   Stairs             Wheelchair Mobility    Modified Rankin (Stroke Patients Only)       Balance Overall balance assessment: Needs assistance Sitting-balance support: Feet supported Sitting balance-Leahy Scale: Good Sitting balance - Comments: supervision for sitting   Standing balance support: Bilateral upper extremity supported;During functional activity Standing balance-Leahy Scale: Poor Standing balance comment: heavy reliance on RW, assist needed                            Cognition Arousal/Alertness: Awake/alert Behavior During Therapy: WFL for tasks assessed/performed Overall Cognitive Status: Within Functional Limits for tasks assessed                                 General Comments: patient alert, oriented, follow direction fairly well      Exercises Other Exercises Other Exercises:  LAQ x 10 B    General Comments        Pertinent Vitals/Pain Pain Assessment: Faces Faces Pain Scale: Hurts a little bit Pain Location: L hip Pain Descriptors / Indicators: Discomfort;Sore;Guarding;Grimacing Pain Intervention(s): Limited activity within patient's tolerance;Monitored  during session;Premedicated before session;Repositioned    Home Living                      Prior Function            PT Goals (current goals can now be found in the care plan section) Acute Rehab PT Goals Patient Stated Goal: to go to rehab prior to returning home, decrease pain PT Goal Formulation: With patient Time For Goal Achievement: 05/10/21 Potential to Achieve Goals: Good Progress towards PT goals: Progressing toward goals    Frequency    Min 2X/week      PT Plan Current plan remains appropriate    Co-evaluation              AM-PAC PT "6 Clicks" Mobility   Outcome Measure  Help needed turning from your back to your side while in a flat bed without using bedrails?: A Little Help needed moving from lying on your back to sitting on the side of a flat bed without using bedrails?: A Little Help needed moving to and from a bed to a chair (including a wheelchair)?: A Little Help needed standing up from a chair using your arms (e.g., wheelchair or bedside chair)?: A Little Help needed to walk in hospital room?: A Lot Help needed climbing 3-5 steps with a railing? : Total 6 Click Score: 15    End of Session Equipment Utilized During Treatment: Gait belt Activity Tolerance: Patient limited by pain;Patient limited by fatigue Patient left: in chair;with call bell/phone within reach;with chair alarm set Nurse Communication: Mobility status PT Visit Diagnosis: Other abnormalities of gait and mobility (R26.89);Difficulty in walking, not elsewhere classified (R26.2);Pain;History of falling (Z91.81) Pain - Right/Left: Left Pain - part of body: Hip     Time: 0955-1010 PT Time Calculation (min) (ACUTE ONLY): 15 min  Charges:  $Gait Training: 8-22 mins                    [employer], PT, GCS 04/28/21,11:23 AM

## 2021-04-28 NOTE — TOC Progression Note (Signed)
Transition of Care [facility]) - Progression Note    Patient Details  Name: Lori Wallace MRN: [id number] Date of Birth: 09/09/1936  Transition of Care [facility]) CM/SW [place], RN Phone Number: 04/28/2021, 1:35 PM  Clinical Narrative:      The patient is to be transported to [facility] at 3 PM by [facility]< the niece Babs Sciara is aware  Expected Discharge Plan: Skilled Nursing Facility Barriers to Discharge: Continued Medical Work up, SNF Pending bed offer, ED SNF auth  Expected Discharge Plan and Services Expected Discharge Plan: [place] In-house Referral: Clinical Social Work   Post Acute Care Choice: [place] Living arrangements for the past 2 months: Single Family Home Expected Discharge Date: 04/28/21                                     Social Determinants of Health (SDOH) Interventions    Readmission Risk Interventions No flowsheet data found.

## 2021-04-28 NOTE — Progress Notes (Signed)
Occupational Therapy Treatment Patient Details Name: Lori Wallace MRN: [id number] DOB: Jan 28, 1936 Today's Date: 04/28/2021    History of present illness 85 y.o. female with medical history significant of hypertension, skin cancer, breast cancer (s/p 04 bilateral lumpectomy), who presents with fall, neck pain and left hip pain. Work-up reveals to ipsilateral pubic rami fractures without evidence of hardware malfunction or repeat hip fracture.  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 No evidence of neurologic deficits. Neurosurgery and orthopedics, recommending nonoperative management. BLE WBAT per ortho, wear [place] J or Aspen collar per neurosurgery.   OT comments  Lori Wallace was seen for OT/PT co-tx this date. Pt received semi-supine in bed, agreeable to OT/PT co-tx. She endorses pain in her LLE, but states it is improved since previous date. Therapists facilitate bed/functional mobility. Pt is able to ambulate with RW given CGA in room. She requires min A to perform STS from room recliner. Pt educated on compensatory strategies for ADL management in consideration of cervical precautions. She is able to return demonstrate understanding during functional grooming task. She requires min cueing for technique during session. Pt making good progress toward goals. Pt continues to benefit from skilled OT services to maximize return to PLOF and minimize risk of future falls, injury, caregiver burden, and readmission. Will continue to follow POC. Discharge recommendation remains appropriate.    Follow Up Recommendations  SNF    Equipment Recommendations  3 in 1 bedside commode    Recommendations for Other Services      Precautions / Restrictions Precautions Precautions: Fall;Cervical Precaution Comments: mod fall, cervical collar Required Braces or Orthoses: Cervical Brace Cervical Brace: Hard collar;At all  times Restrictions Weight Bearing Restrictions: Yes RLE Weight Bearing: Weight bearing as tolerated LLE Weight Bearing: Weight bearing as tolerated Other Position/Activity Restrictions: WBAT per ortho       Mobility Bed Mobility Overal bed mobility: Needs Assistance Bed Mobility: Supine to Sit     Supine to sit: Min guard     General bed mobility comments: min guard, bed rails    Transfers Overall transfer level: Needs assistance Equipment used: Rolling walker (2 wheeled) Transfers: Sit to/from Stand Sit to Stand: Supervision;Min guard;Min assist         General transfer comment: Assist variable during session. MIN A to STS from room recliner. Pt with wide BOS, is noted to require multiple attempts to STS from chair.    Balance Overall balance assessment: Needs assistance Sitting-balance support: Feet supported Sitting balance-Leahy Scale: Good Sitting balance - Comments: supervision for sitting   Standing balance support: Bilateral upper extremity supported;During functional activity Standing balance-Leahy Scale: Poor Standing balance comment: heavy reliance on RW, assist needed                           ADL either performed or assessed with clinical judgement   ADL Overall ADL's : Needs assistance/impaired     Grooming: Sitting;Set up;Supervision/safety;Cueing for compensatory techniques;Oral care Grooming Details (indicate cue type and reason): Cueing for management of cervical precautions. Pt educated on considerations for grooming tasks with cervical precautions.                             Functional mobility during ADLs: Min guard;Cueing for safety;Cueing for sequencing;Rolling walker;Minimal assistance       Vision Baseline Vision/History: Wears glasses Wears Glasses: Reading  only Patient Visual Report: No change from baseline     Perception     Praxis      Cognition Arousal/Alertness: Awake/alert Behavior During  Therapy: WFL for tasks assessed/performed Overall Cognitive Status: Within Functional Limits for tasks assessed                                 General Comments: patient alert, oriented, follow direction fairly well        Exercises Other Exercises Other Exercises: OT facilitates education on safe use of AE/DME for ADL management, cervical precautions, and compensatory ADL management strategies for improved functional independence. Pt eager to participate in grooming tasks and functional mobility this date. Therapist facilitates seated grooming tasks and functional mobility in room. Other Exercises: LAQ x 10 B   Shoulder Instructions       General Comments Hard collar in place at start/end of session.    Pertinent Vitals/ Pain       Pain Assessment: Faces Faces Pain Scale: Hurts a little bit Pain Location: L hip Pain Descriptors / Indicators: Discomfort;Sore;Guarding;Grimacing Pain Intervention(s): Limited activity within patient's tolerance;Monitored during session;Premedicated before session;Repositioned  Home Living                                          Prior Functioning/Environment              Frequency  Min 2X/week        Progress Toward Goals  OT Goals(current goals can now be found in the care plan section)  Progress towards OT goals: Progressing toward goals  Acute Rehab OT Goals Patient Stated Goal: to go to rehab prior to returning home, decrease pain OT Goal Formulation: With patient Time For Goal Achievement: 05/10/21 Potential to Achieve Goals: Good  Plan Discharge plan remains appropriate;Frequency remains appropriate    Co-evaluation    PT/OT/SLP Co-Evaluation/Treatment: Yes Reason for Co-Treatment: To address functional/ADL transfers;For patient/therapist safety PT goals addressed during session: Mobility/safety with mobility;Balance;Proper use of DME OT goals addressed during session: ADL's and  self-care;Proper use of Adaptive equipment and DME      AM-PAC OT "6 Clicks" Daily Activity     Outcome Measure   Help from another person eating meals?: None Help from another person taking care of personal grooming?: A Little Help from another person toileting, which includes using toliet, bedpan, or urinal?: A Little Help from another person bathing (including washing, rinsing, drying)?: A Lot Help from another person to put on and taking off regular upper body clothing?: A Little Help from another person to put on and taking off regular lower body clothing?: A Lot 6 Click Score: 17    End of Session Equipment Utilized During Treatment: Rolling walker;Cervical collar  OT Visit Diagnosis: Other abnormalities of gait and mobility (R26.89);History of falling (Z91.81);Muscle weakness (generalized) (M62.81);Pain Pain - Right/Left: Left Pain - part of body: Hip   Activity Tolerance Patient tolerated treatment well;No increased pain   Patient Left in bed;with call bell/phone within reach;with bed alarm set   Nurse Communication Mobility status        Time: [id number] OT Time Calculation (min): 28 min  Charges: OT General Charges $OT Visit: 1 Visit OT Treatments $Self Care/Home Management : 8-22 mins  Shara Blazing, M.S., OTR/L Ascom: [phone] 04/28/21, 12:27 PM

## 2021-04-28 NOTE — Progress Notes (Signed)
Ems here for patient, report give to both ems and [facility] . Vs taken by Latoya, gown changed paper gown Iv out. Ready for discharge. Leaving at 1457

## 2021-04-28 NOTE — TOC Progression Note (Signed)
Transition of Care [facility]) - Progression Note    Patient Details  Name: Lori Wallace MRN: [id number] Date of Birth: November 07, 1936  [facility]) CM/SW Contact  Su Hilt, RN Phone Number: 04/28/2021, 9:48 AM  Clinical Narrative:     Received approval from [facility] for SNF, Approval number [id number], next review date 6/23  Expected Discharge Plan: [place] Barriers to Discharge: Continued Medical Work up, SNF Pending bed offer, ED SNF auth  Expected Discharge Plan and Services Expected Discharge Plan: [place] In-house Referral: Clinical Social Work   Post Acute Care Choice: [place] Living arrangements for the past 2 months: Single Family Home                                       Social Determinants of Health (SDOH) Interventions    Readmission Risk Interventions No flowsheet data found.

## 2021-04-29 DIAGNOSIS — M8000XD Age-related osteoporosis with current pathological fracture, unspecified site, subsequent encounter for fracture with routine healing: Secondary | ICD-10-CM | POA: Insufficient documentation

## 2021-05-04 ENCOUNTER — Emergency Department
Admission: EM | Admit: 2021-05-04 | Discharge: 2021-05-04 | Disposition: A | Discharge status: Home / Self Care | Payer: Medicare PPO | Attending: Emergency Medicine | Admitting: Emergency Medicine

## 2021-05-04 ENCOUNTER — Emergency Department: Payer: Medicare PPO

## 2021-05-04 ENCOUNTER — Other Ambulatory Visit: Payer: Self-pay

## 2021-05-04 DIAGNOSIS — Z85828 Personal history of other malignant neoplasm of skin: Secondary | ICD-10-CM | POA: Insufficient documentation

## 2021-05-04 DIAGNOSIS — Z96642 Presence of left artificial hip joint: Secondary | ICD-10-CM | POA: Insufficient documentation

## 2021-05-04 DIAGNOSIS — Z96651 Presence of right artificial knee joint: Secondary | ICD-10-CM | POA: Insufficient documentation

## 2021-05-04 DIAGNOSIS — W010XXA Fall on same level from slipping, tripping and stumbling without subsequent striking against object, initial encounter: Secondary | ICD-10-CM | POA: Diagnosis not present

## 2021-05-04 DIAGNOSIS — Z8542 Personal history of malignant neoplasm of other parts of uterus: Secondary | ICD-10-CM | POA: Diagnosis not present

## 2021-05-04 DIAGNOSIS — Z853 Personal history of malignant neoplasm of breast: Secondary | ICD-10-CM | POA: Insufficient documentation

## 2021-05-04 DIAGNOSIS — I672 Cerebral atherosclerosis: Secondary | ICD-10-CM | POA: Diagnosis not present

## 2021-05-04 DIAGNOSIS — Z91010 Allergy to peanuts: Secondary | ICD-10-CM | POA: Insufficient documentation

## 2021-05-04 DIAGNOSIS — S0083XA Contusion of other part of head, initial encounter: Secondary | ICD-10-CM

## 2021-05-04 DIAGNOSIS — S0181XA Laceration without foreign body of other part of head, initial encounter: Secondary | ICD-10-CM

## 2021-05-04 DIAGNOSIS — S01111A Laceration without foreign body of right eyelid and periocular area, initial encounter: Secondary | ICD-10-CM | POA: Insufficient documentation

## 2021-05-04 DIAGNOSIS — S0591XA Unspecified injury of right eye and orbit, initial encounter: Secondary | ICD-10-CM | POA: Diagnosis present

## 2021-05-04 DIAGNOSIS — I10 Essential (primary) hypertension: Secondary | ICD-10-CM | POA: Insufficient documentation

## 2021-05-04 DIAGNOSIS — Z9221 Personal history of antineoplastic chemotherapy: Secondary | ICD-10-CM | POA: Diagnosis not present

## 2021-05-04 DIAGNOSIS — Z87891 Personal history of nicotine dependence: Secondary | ICD-10-CM | POA: Insufficient documentation

## 2021-05-04 DIAGNOSIS — Z79899 Other long term (current) drug therapy: Secondary | ICD-10-CM | POA: Diagnosis not present

## 2021-05-04 NOTE — ED Provider Notes (Signed)
[facility] Emergency Department Provider Note   ____________________________________________   Event Date/Time   First MD Initiated Contact with Patient 05/04/21 1211     (approximate)  I have reviewed the triage vital signs and the nursing notes.   HISTORY  Chief Complaint Fall    HPI Lori Wallace is a 85 y.o. female patient presents with right eyebrow laceration secondary to a fall.  Patient is a mechanical fall.  Patient was hit right above the right eye.  Denies LOC.  Bleeding controlled with direct pressure.  Has not taken blood thinners.  Rates pain as a 2/10.  Described pain as "sore".         Past Medical History:  Diagnosis Date   Actinic keratosis    Arthritis    Breast cancer ([place]) [phone]   Cancer [facility])    breast s/p bilateral lumpectomies with radiation,   Complication of anesthesia    Pt stated her top lip was "puffed up" and felt strange for weeks after having hip surgery. Pt thinks intubation appliance may have caused it   Constipation due to pain medication    Environmental and seasonal allergies    Frequency of urination    Hx of basal cell carcinoma 08/19/2010   Mid dorsum nose supra tip   Hypertension    pt has not had any blood pressure medications in over 4 years   Malignant neoplasm of upper-outer quadrant of female breast ([place]) 1995   [place] wide excision, reexcision, whole breast radiation.   Malignant neoplasm of upper-outer quadrant of female breast [facility]) January 2012   Left:Invasive ductal carcinoma ER 90%, PR 90%, HER-2/neu not over dressing. T1a   Malignant neoplasm of upper-outer quadrant of female breast ([place]) 2002   Right breast   Squamous cell carcinoma of skin 11/23/2007   Right lateral upper eyelid. SCCis   Squamous cell carcinoma of skin 09/03/2019   Right lateral pretibia. EDC.    Squamous cell carcinoma of skin 03/30/2020   R lateral pretibia    Trigger finger of both hands      Patient Active Problem List   Diagnosis Date Noted   Cervical spine fracture ([place]) 04/25/2021   Fall at home, initial encounter 04/25/2021   Fracture of pubic ramus, left, closed, initial encounter ([place]) 04/25/2021   Normocytic anemia 04/25/2021   Closed fracture of ramus of left pubis ([place])    Breast cancer, unspecified laterality 01/06/2016   Left breast mass 01/06/2016   Essential hypertension, benign 01/06/2016   Compression fracture 10/23/2014   Breast cancer ([place]) 12/03/2013   Osteoarthritis of left hip 08/01/2012    Past Surgical History:  Procedure Laterality Date   BACK SURGERY  1960s   lumbar ruptured discs   BREAST BIOPSY     BREAST LUMPECTOMY Left 1995 with Radiation   BREAST LUMPECTOMY Right 2002   with Radiation   BREAST LUMPECTOMY Left 2011   BREAST SURGERY Bilateral    lumpectomies x 3   COLONOSCOPY  2006   Dr. Vira Agar   EYE SURGERY  1990s   bilat cataract removal   FRACTURE SURGERY     L wrist repair, R lower leg   JOINT REPLACEMENT     R knee w/prior arhtroscopy   KYPHOPLASTY N/A 10/23/2014   Procedure: KYPHOPLASTY;  Surgeon: Sinclair Ship, MD;  Location: [place];  Service: Orthopedics;  Laterality: N/A;  T 12 kyphoplasty   TOTAL HIP ARTHROPLASTY  07/30/2012   Procedure: TOTAL  HIP ARTHROPLASTY;  Surgeon: Kerin Salen, MD;  Location: [place];  Service: Orthopedics;  Laterality: Left;   uterine tumor excision     benign   WRIST SURGERY Left     Prior to Admission medications   Medication Sig Start Date End Date Taking? Authorizing Provider  atenolol (TENORMIN) 25 MG tablet Take 25 mg by mouth daily. 04/19/21   [provider]  Calcium-Magnesium-Vitamin D (CALCIUM MAGNESIUM PO) Take 1 tablet by mouth daily.    [provider]  cholecalciferol (VITAMIN D) 1000 UNITS tablet Take 1,000 Units by mouth daily.    [provider]  docusate sodium (COLACE) 100 MG capsule Take 2 capsules (200 mg total) by mouth 2 (two) times  daily. 04/28/21   Wouk, Ailene Rud, MD  ibuprofen (ADVIL) 200 MG tablet Take 200 mg by mouth as needed.    [provider]  loratadine (CLARITIN) 10 MG tablet Take 10 mg by mouth daily as needed for allergies.    [provider]  oxyCODONE-acetaminophen (PERCOCET/ROXICET) 5-325 MG tablet Take 1 tablet by mouth every 4 (four) hours as needed for moderate pain. 04/28/21   Wouk, Ailene Rud, MD  Polyvinyl Alcohol-Povidone (REFRESH OP) Place 1 drop into both eyes as needed (dry eyes).     [provider]  Probiotic Product (PROBIOTIC DAILY PO) Take 1 tablet by mouth daily. Patient not taking: Reported on 04/25/2021    [provider]  sodium chloride (OCEAN) 0.65 % SOLN nasal spray Place 1 spray into both nostrils as needed for congestion.    [provider]  Zinc 50 MG TABS Take 50 mg by mouth daily.    [provider]    Allergies Carrot [daucus carota], Demerol [meperidine], and Peanut-containing drug products  Family History  Problem Relation Age of Onset   Cancer Father    Cancer Sister    Stroke Sister    Breast cancer Neg Hx     Social History Social History   Tobacco Use   Smoking status: Former    Pack years: 0.00   Smokeless tobacco: Never  Substance Use Topics   Alcohol use: No   Drug use: No    Review of Systems  Constitutional: No fever/chills Eyes: No visual changes. ENT: No sore throat. Cardiovascular: Denies chest pain. Respiratory: Denies shortness of breath. Gastrointestinal: No abdominal pain.  No nausea, no vomiting.  No diarrhea.  No constipation. Genitourinary: Negative for dysuria. Musculoskeletal: C-spine fracture and in close ramus fracture of the left pubis. skin: Negative for rash.  Facial laceration and contusion Neurological: Negative for headaches, focal weakness or numbness. Endocrine: Hypertension.  Allergic/Immunilogical: Carrots, Demerol, and  peanuts.  ____________________________________________   PHYSICAL EXAM:  VITAL SIGNS: ED Triage Vitals  Enc Vitals Group     BP 05/04/21 1110 (!) 151/62     Pulse Rate 05/04/21 1110 (!) 59     Resp 05/04/21 1110 18     Temp 05/04/21 1110 98.5 F (36.9 C)     Temp Source 05/04/21 1110 Oral     SpO2 05/04/21 1110 96 %     Weight --      Height --      Head Circumference --      Peak Flow --      Pain Score 05/04/21 1106 2     Pain Loc --      Pain Edu? --      Excl. in [place]? --     Constitutional: Alert  and oriented. Well appearing and in no acute distress. Eyes: Conjunctivae are normal. PERRL. EOMI. Head: Atraumatic. Nose: No congestion/rhinnorhea. Mouth/Throat: Mucous membranes are moist.  Oropharynx non-erythematous. Neck: No stridor.  Wearing c-collar.   Hematological/Lymphatic/Immunilogical: No cervical lymphadenopathy. Cardiovascular: Normal rate, regular rhythm. Grossly normal heart sounds.  Good peripheral circulation. Respiratory: Normal respiratory effort.  No retractions. Lungs CTAB. Genitourinary: Deferred Musculoskeletal: No lower extremity tenderness nor edema.  No joint effusions. Neurologic:  Normal speech and language. No gross focal neurologic deficits are appreciated. No gait instability. Skin: 1.5 cm laceration left eyebrow Psychiatric: Mood and affect are normal. Speech and behavior are normal.  ____________________________________________   LABS (all labs ordered are listed, but only abnormal results are displayed)  Labs Reviewed - No data to display ____________________________________________  EKG   ____________________________________________  RADIOLOGY I, Sable Feil, personally viewed and evaluated these images (plain radiographs) as part of my medical decision making, as well as reviewing the written report by the radiologist.  ED MD interpretation:    Official radiology report(s): CT Head Wo Contrast  Result Date:  05/04/2021 CLINICAL DATA:  Fall EXAM: CT HEAD WITHOUT CONTRAST TECHNIQUE: Contiguous axial images were obtained from the base of the skull through the vertex without intravenous contrast. COMPARISON:  04/25/2021 FINDINGS: Brain: There is no acute intracranial hemorrhage, mass effect, or edema. Gray-white differentiation is preserved. There is no extra-axial fluid collection. Prominence of the ventricles and sulci reflects stable parenchymal volume loss. Patchy low-attenuation in the supratentorial white matter probably reflects stable chronic microvascular ischemic changes. Vascular: There is atherosclerotic calcification at the skull base. Skull: Calvarium is unremarkable. Sinuses/Orbits: No acute finding. Other: None. IMPRESSION: No evidence of acute intracranial injury. No significant change since recent prior study. Electronically Signed   By: Macy Mis M.D.   On: 05/04/2021 14:26    ____________________________________________   PROCEDURES  Procedure(s) performed (including Critical Care):  Marland KitchenMarland KitchenLaceration Repair  Date/Time: 05/04/2021 1:33 PM Performed by: Sable Feil, PA-C Authorized by: Sable Feil, PA-C   Consent:    Consent obtained:  Verbal   Consent given by:  Patient   Risks discussed:  Infection, pain, poor cosmetic result and need for additional repair Universal protocol:    Procedure explained and questions answered to patient or proxy's satisfaction: yes     Imaging studies available: yes     Immediately prior to procedure, a time out was called: yes     Patient identity confirmed:  Verbally with patient and arm band Anesthesia:    Anesthesia method:  None Laceration details:    Location:  Face   Face location:  R eyebrow   Length (cm):  1.5   Depth (mm):  2 Pre-procedure details:    Preparation:  Patient was prepped and draped in usual sterile fashion and imaging obtained to evaluate for foreign bodies Exploration:    Limited defect created (wound  extended): no     Hemostasis achieved with:  Direct pressure   Wound exploration: entire depth of wound visualized     Contaminated: no   Treatment:    Area cleansed with:  Povidone-iodine and saline   Amount of cleaning:  Standard   Visualized foreign bodies/material removed: no     Debridement:  None   Undermining:  None   Scar revision: no   Skin repair:    Repair method:  Tissue adhesive Approximation:    Approximation:  Close Repair type:    Repair type:  Simple Post-procedure details:  Dressing:  Open (no dressing)   ____________________________________________   INITIAL IMPRESSION / ASSESSMENT AND PLAN / ED COURSE  As part of my medical decision making, I reviewed the following data within the [place]         Patient presents for facial laceration contusion secondary to mechanical fall.  There was no LOC.  Discussed no acute findings on CT of the head.  See procedure note for wound closure.  Patient given discharge care instructions and advised return to ED if wound reopens for healing is complete.      ____________________________________________   FINAL CLINICAL IMPRESSION(S) / ED DIAGNOSES  Final diagnoses:  Facial laceration, initial encounter  Contusion of face, initial encounter     ED Discharge Orders     None        Note:  This document was prepared using Dragon voice recognition software and may include unintentional dictation errors.    Sable Feil, PA-C 05/04/21 1435    Naaman Plummer, MD 05/04/21 1539

## 2021-05-04 NOTE — Discharge Instructions (Addendum)
No acute findings on CT of the head.  Read and follow discharge care instruction.

## 2021-05-04 NOTE — ED Triage Notes (Signed)
Pt comes with c/o fall this am. Pt from [facility] via EMS. Pt stood up and fell forward. Pt states she fell because he socks were slippery. Pt did hit her head above right eye. Pt has bandage in place bleeding controlled.  Pt denies any LOC. Pt is in neck collar prior from recent injuries and getting rehab. Pt not on blood thinners.  Pt states little back pain.

## 2021-05-04 NOTE — ED Notes (Signed)
See triage note  Presents via EMS s/p slip and fall  States she slipped hitting her head on the since  Small laceration noted above right eye  Pt presents with c-collar in place from previous visit

## 2021-05-25 ENCOUNTER — Ambulatory Visit: Payer: Medicare PPO | Admitting: Dermatology

## 2021-06-08 ENCOUNTER — Other Ambulatory Visit: Payer: Self-pay

## 2021-06-08 ENCOUNTER — Ambulatory Visit: Payer: Medicare PPO | Admitting: Dermatology

## 2021-06-08 DIAGNOSIS — L570 Actinic keratosis: Secondary | ICD-10-CM | POA: Diagnosis not present

## 2021-06-08 DIAGNOSIS — L578 Other skin changes due to chronic exposure to nonionizing radiation: Secondary | ICD-10-CM

## 2021-06-08 DIAGNOSIS — Z85828 Personal history of other malignant neoplasm of skin: Secondary | ICD-10-CM

## 2021-06-08 DIAGNOSIS — L821 Other seborrheic keratosis: Secondary | ICD-10-CM

## 2021-06-08 DIAGNOSIS — D692 Other nonthrombocytopenic purpura: Secondary | ICD-10-CM

## 2021-06-08 DIAGNOSIS — Z1283 Encounter for screening for malignant neoplasm of skin: Secondary | ICD-10-CM

## 2021-06-08 DIAGNOSIS — L853 Xerosis cutis: Secondary | ICD-10-CM

## 2021-06-08 DIAGNOSIS — Z86007 Personal history of in-situ neoplasm of skin: Secondary | ICD-10-CM

## 2021-06-08 DIAGNOSIS — Z872 Personal history of diseases of the skin and subcutaneous tissue: Secondary | ICD-10-CM

## 2021-06-08 DIAGNOSIS — D1800 Hemangioma unspecified site: Secondary | ICD-10-CM

## 2021-06-08 DIAGNOSIS — L814 Other melanin hyperpigmentation: Secondary | ICD-10-CM

## 2021-06-08 DIAGNOSIS — D229 Melanocytic nevi, unspecified: Secondary | ICD-10-CM

## 2021-06-08 NOTE — Patient Instructions (Signed)
Gentle Skin Care Guide  1. Bathe no more than once a day.  2. Avoid bathing in hot water  3. Use a mild soap like Dove, Vanicream, Cetaphil, CeraVe. Can use Lever 2000 or Cetaphil antibacterial soap  4. Use soap only where you need it. On most days, use it under your arms, between your legs, and on your feet. Let the water rinse other areas unless visibly dirty.  5. When you get out of the bath/shower, use a towel to gently blot your skin dry, don't rub it.  6. While your skin is still a little damp, apply a moisturizing cream such as Vanicream, CeraVe, Cetaphil, Eucerin, Sarna lotion or plain Vaseline Jelly. For hands apply Neutrogena [place] Hand Cream or Excipial Hand Cream.  7. Reapply moisturizer any time you start to itch or feel dry.  8. Sometimes using free and clear laundry detergents can be helpful. Fabric softener sheets should be avoided. Downy Free & Gentle liquid, or any liquid fabric softener that is free of dyes and perfumes, it acceptable to use  9. If your doctor has given you prescription creams you may apply moisturizers over them

## 2021-06-08 NOTE — Progress Notes (Signed)
Follow-Up Visit   Subjective  Lori Wallace is a 85 y.o. female who presents for the following: Follow-up (6 mo total body exam today. Pt has hx of SCC, BCC, SCCis, and AKs. Pt has no new or changing spots that she has noticed. Pt c/o of dry skin all over. ).  Patient here for full body skin exam and skin cancer screening.   The following portions of the chart were reviewed this encounter and updated as appropriate:      Review of Systems: No other skin or systemic complaints except as noted in HPI or Assessment and Plan.   Objective  Well appearing patient in no apparent distress; mood and affect are within normal limits.  A full examination was performed including scalp, head, eyes, ears, nose, lips, neck, chest, axillae, abdomen, back, buttocks, bilateral upper extremities, bilateral lower extremities, hands, feet, fingers, toes, fingernails, and toenails. All findings within normal limits unless otherwise noted below.  Right Hand dorsum x 1, right nasal dorsum x 1, left malar cheek x 1 (3) Pink scaly papules    Assessment & Plan  AK (actinic keratosis) (3) Right Hand dorsum x 1, right nasal dorsum x 1, left malar cheek x 1  Hypertrophic AK on right hand dorsum and left malar cheek   Actinic keratoses are precancerous spots that appear secondary to cumulative UV radiation exposure/sun exposure over time. They are chronic with expected duration over 1 year. A portion of actinic keratoses will progress to squamous cell carcinoma of the skin. It is not possible to reliably predict which spots will progress to skin cancer and so treatment is recommended to prevent development of skin cancer.  Recommend daily broad spectrum sunscreen SPF 30+ to sun-exposed areas, reapply every 2 hours as needed.  Recommend staying in the shade or wearing long sleeves, sun glasses (UVA+UVB protection) and wide brim hats (4-inch brim around the entire circumference of the hat). Call for new or  changing lesions.     Destruction of lesion - Right Hand dorsum x 1, right nasal dorsum x 1, left malar cheek x 1  Destruction method: cryotherapy   Informed consent: discussed and consent obtained   Lesion destroyed using liquid nitrogen: Yes   Region frozen until ice ball extended beyond lesion: Yes   Outcome: patient tolerated procedure well with no complications   Post-procedure details: wound care instructions given   Additional details:  Prior to procedure, discussed risks of blister formation, small wound, skin dyspigmentation, or rare scar following cryotherapy. Recommend Vaseline ointment to treated areas while healing.   Lentigines - Scattered tan macules - Due to sun exposure - Benign-appering, observe - Recommend daily broad spectrum sunscreen SPF 30+ to sun-exposed areas, reapply every 2 hours as needed. - Call for any changes  Seborrheic Keratoses - Stuck-on, waxy, tan-brown papules and/or plaques  - Benign-appearing - Discussed benign etiology and prognosis. - Observe - Call for any changes  Melanocytic Nevi - Tan-brown and/or pink-flesh-colored symmetric macules and papules - Benign appearing on exam today - Observation - Call clinic for new or changing moles - Recommend daily use of broad spectrum spf 30+ sunscreen to sun-exposed areas.   Hemangiomas - Red papules - Discussed benign nature - Observe - Call for any changes  Actinic Damage - Chronic condition, secondary to cumulative UV/sun exposure - diffuse scaly erythematous macules with underlying dyspigmentation - Recommend daily broad spectrum sunscreen SPF 30+ to sun-exposed areas, reapply every 2 hours as needed.  - Staying  in the shade or wearing long sleeves, sun glasses (UVA+UVB protection) and wide brim hats (4-inch brim around the entire circumference of the hat) are also recommended for sun protection.  - Call for new or changing lesions.  Xerosis - diffuse xerotic patches - recommend  gentle, hydrating skin care - gentle skin care handout given, Aveeno, Eucerin cream samples given  Purpura - Chronic; persistent and recurrent.  Treatable, but not curable. - Violaceous macules and patches - Benign - Related to trauma, age, sun damage and/or use of blood thinners, chronic use of topical and/or oral steroids - Observe - Can use OTC arnica containing moisturizer such as Dermend Bruise Formula if desired - Call for worsening or other concerns  Skin cancer screening performed today.  History of Squamous Cell Carcinoma of the Skin - No evidence of recurrence today - Recommend regular full body skin exams - Recommend daily broad spectrum sunscreen SPF 30+ to sun-exposed areas, reapply every 2 hours as needed.  - Call if any new or changing lesions are noted between office visits  History of Basal Cell Carcinoma of the Skin - No evidence of recurrence today - Recommend regular full body skin exams - Recommend daily broad spectrum sunscreen SPF 30+ to sun-exposed areas, reapply every 2 hours as needed.  - Call if any new or changing lesions are noted between office visits  History of Squamous Cell Carcinoma in Situ of the Skin - No evidence of recurrence today R lat pretibia - Recommend regular full body skin exams - Recommend daily broad spectrum sunscreen SPF 30+ to sun-exposed areas, reapply every 2 hours as needed.  - Call if any new or changing lesions are noted between office visits  History of PreCancerous Actinic Keratosis  - site(s) of PreCancerous Actinic Keratosis clear today. - these may recur and new lesions may form requiring treatment to prevent transformation into skin cancer - observe for new or changing spots and contact [facility] for appointment if occur - photoprotection with sun protective clothing; sunglasses and broad spectrum sunscreen with SPF of at least 30 + and frequent self skin exams recommended - yearly exams by a dermatologist  recommended for persons with history of PreCancerous Actinic Keratoses   Return in about 1 year (around 06/08/2022) for TBSE.  I, Harriett Sine, CMA, am acting as scribe for Brendolyn Patty, MD.  Documentation: I have reviewed the above documentation for accuracy and completeness, and I agree with the above.  Brendolyn Patty MD

## 2022-03-09 ENCOUNTER — Ambulatory Visit: Payer: Medicare PPO | Admitting: Dermatology

## 2022-03-09 DIAGNOSIS — L578 Other skin changes due to chronic exposure to nonionizing radiation: Secondary | ICD-10-CM | POA: Diagnosis not present

## 2022-03-09 DIAGNOSIS — L853 Xerosis cutis: Secondary | ICD-10-CM | POA: Diagnosis not present

## 2022-03-09 DIAGNOSIS — L570 Actinic keratosis: Secondary | ICD-10-CM | POA: Diagnosis not present

## 2022-03-09 DIAGNOSIS — C44622 Squamous cell carcinoma of skin of right upper limb, including shoulder: Secondary | ICD-10-CM | POA: Diagnosis not present

## 2022-03-09 DIAGNOSIS — D485 Neoplasm of uncertain behavior of skin: Secondary | ICD-10-CM

## 2022-03-09 NOTE — Progress Notes (Signed)
? ?Follow-Up Visit ?  ?Subjective  ?Lori Wallace is a 86 y.o. female who presents for the following: Growth (Right upper arm x 3-4 weeks. Sore, growing. She also has a growth on her right hand that gets sore at times. ). History of BCC/SCC.  She also has itchy dry skin.  Recently started using Aloe lotion which helps.  Uses Dial soap. ? ?Patient here with grandniece.  ? ?The following portions of the chart were reviewed this encounter and updated as appropriate:  ?  ?  ? ?Review of Systems:  No other skin or systemic complaints except as noted in HPI or Assessment and Plan. ? ?Objective  ?Well appearing patient in no apparent distress; mood and affect are within normal limits. ? ?A focused examination was performed including arms, hands. Relevant physical exam findings are noted in the Assessment and Plan. ? ?Right Upper Arm ?1.3 cm keratotic nodule ? ? ? ? ? ? ?R 3rd finger at base ?Keratotic papule ? ? ? ?Assessment & Plan  ?Actinic Damage ?- chronic, secondary to cumulative UV radiation exposure/sun exposure over time ?- diffuse scaly erythematous macules with underlying dyspigmentation ?- Recommend daily broad spectrum sunscreen SPF 30+ to sun-exposed areas, reapply every 2 hours as needed.  ?- Recommend staying in the shade or wearing long sleeves, sun glasses (UVA+UVB protection) and wide brim hats (4-inch brim around the entire circumference of the hat). ?- Call for new or changing lesions. ? ?Xerosis ?- diffuse xerotic patches ?- recommend gentle, hydrating skin care ?- gentle skin care handout given, Change to Dove soap.  Samples given Dove and Aveeno balm ? ? ?Neoplasm of uncertain behavior of skin ?Right Upper Arm ? ?Skin / nail biopsy ?Type of biopsy: tangential   ?Informed consent: discussed and consent obtained   ?Patient was prepped and draped in usual sterile fashion: Area prepped with alcohol. ?Anesthesia: the lesion was anesthetized in a standard fashion   ?Anesthetic:  1% lidocaine w/  epinephrine 1-100,000 buffered w/ 8.4% NaHCO3 ?Instrument used: flexible razor blade   ?Hemostasis achieved with: pressure, aluminum chloride and electrodesiccation   ?Outcome: patient tolerated procedure well   ?Post-procedure details: wound care instructions given   ?Post-procedure details comment:  Ointment and small bandage applied ? ?Destruction of lesion ? ?Destruction method: electrodesiccation and curettage   ?Informed consent: discussed and consent obtained   ?Timeout:  patient name, date of birth, surgical site, and procedure verified ?Anesthesia: the lesion was anesthetized in a standard fashion   ?Anesthetic:  1% lidocaine w/ epinephrine 1-100,000 local infiltration ?Curettage performed in three different directions: Yes   ?Electrodesiccation performed over the curetted area: Yes   ?Final wound size (cm):  1.5 ?Hemostasis achieved with:  pressure, aluminum chloride and electrodesiccation ?Outcome: patient tolerated procedure well with no complications   ?Post-procedure details: wound care instructions given   ?Post-procedure details comment:  Ointment and bandage applied. ? ?Specimen 1 - Surgical pathology ?Differential Diagnosis: r/o SCC ?Check Margins: No ?1.3 cm keratotic nodule ?EDC today ? ? ?Biopsy and EDC today. ? ?Hypertrophic actinic keratosis ?R 3rd finger at base ? ?Actinic keratoses are precancerous spots that appear secondary to cumulative UV radiation exposure/sun exposure over time. They are chronic with expected duration over 1 year. A portion of actinic keratoses will progress to squamous cell carcinoma of the skin. It is not possible to reliably predict which spots will progress to skin cancer and so treatment is recommended to prevent development of skin cancer. ? ?Recommend daily broad  spectrum sunscreen SPF 30+ to sun-exposed areas, reapply every 2 hours as needed.  ?Recommend staying in the shade or wearing long sleeves, sun glasses (UVA+UVB protection) and wide brim hats (4-inch  brim around the entire circumference of the hat). ?Call for new or changing lesions. ? ?Destruction of lesion - R 3rd finger at base ? ?Destruction method: cryotherapy   ?Informed consent: discussed and consent obtained   ?Lesion destroyed using liquid nitrogen: Yes   ?Region frozen until ice ball extended beyond lesion: Yes   ?Outcome: patient tolerated procedure well with no complications   ?Post-procedure details: wound care instructions given   ?Additional details:  Prior to procedure, discussed risks of blister formation, small wound, skin dyspigmentation, or rare scar following cryotherapy. Recommend Vaseline ointment to treated areas while healing. ? ? ? ?Return as scheduled. ? ?I, Jamesetta Orleans, CMA, am acting as scribe for Brendolyn Patty, MD . ?Documentation: I have reviewed the above documentation for accuracy and completeness, and I agree with the above. ? ?Brendolyn Patty MD  ? ?

## 2022-03-09 NOTE — Patient Instructions (Addendum)
Cryotherapy Aftercare ? ?Wash gently with soap and water everyday.   ?Apply Vaseline and Band-Aid daily until healed.  ? ? ?Wound Care Instructions ? ?Cleanse wound gently with soap and water once a day then pat dry with clean gauze. Apply a thing coat of Petrolatum (petroleum jelly, "Vaseline") over the wound (unless you have an allergy to this). We recommend that you use a new, sterile tube of Vaseline. Do not pick or remove scabs. Do not remove the yellow or white "healing tissue" from the base of the wound. ? ?Cover the wound with fresh, clean, nonstick gauze and secure with paper tape. You may use Band-Aids in place of gauze and tape if the would is small enough, but would recommend trimming much of the tape off as there is often too much. Sometimes Band-Aids can irritate the skin. ? ?You should call the office for your biopsy report after 1 week if you have not already been contacted. ? ?If you experience any problems, such as abnormal amounts of bleeding, swelling, significant bruising, significant pain, or evidence of infection, please call the office immediately. ? ?FOR ADULT SURGERY PATIENTS: If you need something for pain relief you may take 1 extra strength Tylenol (acetaminophen) AND 2 Ibuprofen ('200mg'$  each) together every 4 hours as needed for pain. (do not take these if you are allergic to them or if you have a reason you should not take them.) Typically, you may only need pain medication for 1 to 3 days.  ? ? ?Gentle Skin Care Guide ? ?1. Bathe no more than once a day. ? ?2. Avoid bathing in hot water ? ?3. Use a mild soap like Dove, Vanicream, Cetaphil, CeraVe. Can use Lever 2000 or Cetaphil antibacterial soap ? ?4. Use soap only where you need it. On most days, use it under your arms, between your legs, and on your feet. Let the water rinse other areas unless visibly dirty. ? ?5. When you get out of the bath/shower, use a towel to gently blot your skin dry, don't rub it. ? ?6. While your skin is  still a little damp, apply a moisturizing cream such as Vanicream, CeraVe, Cetaphil, Eucerin, Sarna lotion or plain Vaseline Jelly. For hands apply Neutrogena [place] Hand Cream or Excipial Hand Cream. ? ?7. Reapply moisturizer any time you start to itch or feel dry. ? ?8. Sometimes using free and clear laundry detergents can be helpful. Fabric softener sheets should be avoided. Downy Free & Gentle liquid, or any liquid fabric softener that is free of dyes and perfumes, it acceptable to use ? ?9. If your doctor has given you prescription creams you may apply moisturizers over them  ? ? ? ?If You Need Anything After Your Visit ? ?If you have any questions or concerns for your doctor, please call our main line at [phone] and press option 4 to reach your doctor's medical assistant. If no one answers, please leave a voicemail as directed and we will return your call as soon as possible. Messages left after 4 pm will be answered the following business day.  ? ?You may also send [place] a message via MyChart. We typically respond to MyChart messages within 1-2 business days. ? ?For prescription refills, please ask your pharmacy to contact our office. Our fax number is [phone]. ? ?If you have an urgent issue when the clinic is closed that cannot wait until the next business day, you can page your doctor at the number below.   ? ?Please  note that while we do our best to be available for urgent issues outside of office hours, we are not available 24/7.  ? ?If you have an urgent issue and are unable to reach [place], you may choose to seek medical care at your doctor's office, retail clinic, urgent care center, or emergency room. ? ?If you have a medical emergency, please immediately call 911 or go to the emergency department. ? ?Pager Numbers ? ?- Dr. Nehemiah Massed: [phone] ? ?- Dr. Laurence Ferrari: [phone] ? ?- Dr. Nicole Kindred: [phone] ? ?In the event of inclement weather, please call our main line at [phone] for an  update on the status of any delays or closures. ? ?Dermatology Medication Tips: ?Please keep the boxes that topical medications come in in order to help keep track of the instructions about where and how to use these. Pharmacies typically print the medication instructions only on the boxes and not directly on the medication tubes.  ? ?If your medication is too expensive, please contact our office at [phone] option 4 or send [place] a message through [place].  ? ?We are unable to tell what your co-pay for medications will be in advance as this is different depending on your insurance coverage. However, we may be able to find a substitute medication at lower cost or fill out paperwork to get insurance to cover a needed medication.  ? ?If a prior authorization is required to get your medication covered by your insurance company, please allow [place] 1-2 business days to complete this process. ? ?Drug prices often vary depending on where the prescription is filled and some pharmacies may offer cheaper prices. ? ?The website www.goodrx.com contains coupons for medications through different pharmacies. The prices here do not account for what the cost may be with help from insurance (it may be cheaper with your insurance), but the website can give you the price if you did not use any insurance.  ?- You can print the associated coupon and take it with your prescription to the pharmacy.  ?- You may also stop by our office during regular business hours and pick up a GoodRx coupon card.  ?- If you need your prescription sent electronically to a different pharmacy, notify our office through [facility] or by phone at [phone] option 4. ? ? ? ? ?Si Usted Necesita Algo Despu?s de Su Visita ? ?Tambi?n puede enviarnos un mensaje a trav?s de MyChart. Por lo general respondemos a los mensajes de MyChart en el transcurso de 1 a 2 d?as h?biles. ? ?Para renovar recetas, por favor pida a su farmacia que se ponga en contacto con  nuestra oficina. Nuestro n?mero de fax es el [phone]. ? ?Si tiene un asunto urgente cuando la cl?nica est? cerrada y que no puede esperar hasta el siguiente d?a h?bil, puede llamar/localizar a su doctor(a) al n?mero que aparece a continuaci?n.  ? ?Por favor, tenga en cuenta que aunque hacemos todo lo posible para estar disponibles para asuntos urgentes fuera del horario de oficina, no estamos disponibles las 24 horas del d?a, los 7 d?as de la semana.  ? ?Si tiene un problema urgente y no puede comunicarse con nosotros, puede optar por buscar atenci?n m?dica  en el consultorio de su doctor(a), en una cl?nica privada, en un centro de atenci?n urgente o en una sala de emergencias. ? ?Si tiene Engineer, maintenance (IT) m?dica, por favor llame inmediatamente al 911 o vaya a la sala de emergencias. ? ?N?meros de b?per ? ?- Dr. Nehemiah Massed: [phone] ? ?-  DraLaurence Ferrari: [phone] ? ?- Dra. Nicole Kindred: [phone] ? ?En caso de inclemencias del tiempo, por favor llame a nuestra l?nea principal al [phone] para una actualizaci?n sobre el estado de cualquier retraso o cierre. ? ?Consejos para la medicaci?n en dermatolog?a: ?Por favor, guarde las cajas en las que vienen los medicamentos de uso t?pico para ayudarle a seguir las instrucciones sobre d?nde y c?mo usarlos. Las farmacias generalmente imprimen las instrucciones del medicamento s?lo en las cajas y no directamente en los tubos del [place].  ? ?Si su medicamento es muy caro, por favor, p?ngase en contacto con Zigmund Daniel llamando al [phone] y presione la opci?n 4 o env?enos un mensaje a trav?s de MyChart.  ? ?No podemos decirle cu?l ser? su copago por los medicamentos por adelantado ya que esto es diferente dependiendo de la cobertura de su seguro. Sin embargo, es posible que podamos encontrar un medicamento sustituto a Electrical engineer un formulario para que el seguro cubra el medicamento que se considera necesario.  ? ?Si se requiere Ardelia Mems autorizaci?n  previa para que su compa??a de seguros [place] su medicamento, por favor perm?tanos de 1 a 2 d?as h?biles para completar este proceso. ? ?Los precios de los medicamentos var?an con frecuencia dependiendo del Environmental consultant de

## 2022-03-14 ENCOUNTER — Telehealth: Payer: Self-pay

## 2022-03-14 NOTE — Telephone Encounter (Signed)
-----   Message from Brendolyn Patty, MD sent at 03/14/2022  1:34 PM EDT ----- ?Skin , right upper arm ?WELL DIFFERENTIATED SQUAMOUS CELL CARCINOMA, CYSTIC PATTERN, BASE INVOLVED ? ?SCC skin cancer- already treated with EDC at time of biopsy ?  ? - please call patient ?

## 2022-03-14 NOTE — Telephone Encounter (Signed)
Advised patient biopsy of the right upper arm was SCC and was treated with EDC at the time of biopsy. No further treatment needed. Will recheck at follow-up appt.  ?

## 2022-05-02 DIAGNOSIS — R7303 Prediabetes: Secondary | ICD-10-CM | POA: Insufficient documentation

## 2022-06-21 ENCOUNTER — Ambulatory Visit: Payer: Medicare PPO | Admitting: Dermatology

## 2022-06-21 ENCOUNTER — Encounter: Payer: Self-pay | Admitting: Dermatology

## 2022-06-21 DIAGNOSIS — L300 Nummular dermatitis: Secondary | ICD-10-CM

## 2022-06-21 DIAGNOSIS — L821 Other seborrheic keratosis: Secondary | ICD-10-CM

## 2022-06-21 DIAGNOSIS — R238 Other skin changes: Secondary | ICD-10-CM | POA: Diagnosis not present

## 2022-06-21 DIAGNOSIS — Z1283 Encounter for screening for malignant neoplasm of skin: Secondary | ICD-10-CM

## 2022-06-21 DIAGNOSIS — Z85828 Personal history of other malignant neoplasm of skin: Secondary | ICD-10-CM

## 2022-06-21 DIAGNOSIS — D229 Melanocytic nevi, unspecified: Secondary | ICD-10-CM

## 2022-06-21 DIAGNOSIS — L578 Other skin changes due to chronic exposure to nonionizing radiation: Secondary | ICD-10-CM

## 2022-06-21 DIAGNOSIS — L570 Actinic keratosis: Secondary | ICD-10-CM

## 2022-06-21 DIAGNOSIS — L814 Other melanin hyperpigmentation: Secondary | ICD-10-CM

## 2022-06-21 DIAGNOSIS — L820 Inflamed seborrheic keratosis: Secondary | ICD-10-CM | POA: Diagnosis not present

## 2022-06-21 DIAGNOSIS — Z86007 Personal history of in-situ neoplasm of skin: Secondary | ICD-10-CM

## 2022-06-21 DIAGNOSIS — D1800 Hemangioma unspecified site: Secondary | ICD-10-CM

## 2022-06-21 MED ORDER — MOMETASONE FUROATE 0.1 % EX CREA: TOPICAL_CREAM | CUTANEOUS | 2 refills | Status: DC

## 2022-06-21 NOTE — Progress Notes (Signed)
Follow-Up Visit   Subjective  Lori Wallace is a 86 y.o. female who presents for the following: Annual Exam (Hx of SCC's, BCC, AK's. Areas of concern today). The patient presents for Total-Body Skin Exam (TBSE) for skin cancer screening and mole check.  The patient has spots, moles and lesions to be evaluated, some may be new or changing and the patient has concerns that these could be cancer.  Spot on arm and back is itchy and irritated.  Niece, Nira Conn,  with patient.   The following portions of the chart were reviewed this encounter and updated as appropriate:      Review of Systems: No other skin or systemic complaints except as noted in HPI or Assessment and Plan.   Objective  Well appearing patient in no apparent distress; mood and affect are within normal limits.  A full examination was performed including scalp, head, eyes, ears, nose, lips, neck, chest, axillae, abdomen, back, buttocks, bilateral upper extremities, bilateral lower extremities, hands, feet, fingers, toes, fingernails, and toenails. All findings within normal limits unless otherwise noted below.  Right Upper Arm - Posterior x1, right mid back at bra line x1 (2) Erythematous keratotic or waxy stuck-on papule  Right Forearm - Posterior x1, left hand dorsum x2, left forearm x2, nasal tip x1, chest x7 (13) Erythematous thin papules/macules with gritty scale.   Right Mid Helix Violaceous macule  abdomen and hips Pink scaly patches on abdomen and hips   Assessment & Plan   History of Basal Cell Carcinoma of the Skin - No evidence of recurrence today - Recommend regular full body skin exams - Recommend daily broad spectrum sunscreen SPF 30+ to sun-exposed areas, reapply every 2 hours as needed.  - Call if any new or changing lesions are noted between office visits   History of Squamous Cell Carcinoma of the Skin - No evidence of recurrence today - Recommend regular full body skin exams - Recommend  daily broad spectrum sunscreen SPF 30+ to sun-exposed areas, reapply every 2 hours as needed.  - Call if any new or changing lesions are noted between office visits  History of Squamous Cell Carcinoma in Situ of the Skin - No evidence of recurrence today - Recommend regular full body skin exams - Recommend daily broad spectrum sunscreen SPF 30+ to sun-exposed areas, reapply every 2 hours as needed.  - Call if any new or changing lesions are noted between office visits   Lentigines - Scattered tan macules - Due to sun exposure - Benign-appearing, observe - Recommend daily broad spectrum sunscreen SPF 30+ to sun-exposed areas, reapply every 2 hours as needed. - Call for any changes  Seborrheic Keratoses - Stuck-on, waxy, tan-brown papules and/or plaques  - Benign-appearing - Discussed benign etiology and prognosis. - Observe - Call for any changes  Melanocytic Nevi - Tan-brown and/or pink-flesh-colored symmetric macules and papules - Benign appearing on exam today - Observation - Call clinic for new or changing moles - Recommend daily use of broad spectrum spf 30+ sunscreen to sun-exposed areas.   Hemangiomas - Red papules - Discussed benign nature - Observe - Call for any changes  Actinic Damage - Chronic condition, secondary to cumulative UV/sun exposure - diffuse scaly erythematous macules with underlying dyspigmentation - Recommend daily broad spectrum sunscreen SPF 30+ to sun-exposed areas, reapply every 2 hours as needed.  - Staying in the shade or wearing long sleeves, sun glasses (UVA+UVB protection) and wide brim hats (4-inch brim around the entire circumference  of the hat) are also recommended for sun protection.  - Call for new or changing lesions.  Skin cancer screening performed today.    Inflamed seborrheic keratosis (2) Right Upper Arm - Posterior x1, right mid back at bra line x1  Symptomatic, irritating, patient would like treated.    Destruction  of lesion - Right Upper Arm - Posterior x1, right mid back at bra line x1  Destruction method: cryotherapy   Informed consent: discussed and consent obtained   Lesion destroyed using liquid nitrogen: Yes   Region frozen until ice ball extended beyond lesion: Yes   Outcome: patient tolerated procedure well with no complications   Post-procedure details: wound care instructions given   Additional details:  Prior to procedure, discussed risks of blister formation, small wound, skin dyspigmentation, or rare scar following cryotherapy. Recommend Vaseline ointment to treated areas while healing.   AK (actinic keratosis) (13) Right Forearm - Posterior x1, left hand dorsum x2, left forearm x2, nasal tip x1, chest x7  Actinic keratoses are precancerous spots that appear secondary to cumulative UV radiation exposure/sun exposure over time. They are chronic with expected duration over 1 year. A portion of actinic keratoses will progress to squamous cell carcinoma of the skin. It is not possible to reliably predict which spots will progress to skin cancer and so treatment is recommended to prevent development of skin cancer.  Recommend daily broad spectrum sunscreen SPF 30+ to sun-exposed areas, reapply every 2 hours as needed.  Recommend staying in the shade or wearing long sleeves, sun glasses (UVA+UVB protection) and wide brim hats (4-inch brim around the entire circumference of the hat). Call for new or changing lesions.  Destruction of lesion - Right Forearm - Posterior x1, left hand dorsum x2, left forearm x2, nasal tip x1, chest x7  Destruction method: cryotherapy   Informed consent: discussed and consent obtained   Lesion destroyed using liquid nitrogen: Yes   Region frozen until ice ball extended beyond lesion: Yes   Outcome: patient tolerated procedure well with no complications   Post-procedure details: wound care instructions given   Additional details:  Prior to procedure, discussed  risks of blister formation, small wound, skin dyspigmentation, or rare scar following cryotherapy. Recommend Vaseline ointment to treated areas while healing.   Venous lake Right Mid Helix  Benign, observe.    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 Condition is symptomatic/ bothersome to patient. Not currently at goal.   Start Mometasone cream twice daily to affected body areas for up to 2 weeks as needed for rash/itching. Avoid applying to face, groin, and axilla. Use as directed. Long-term use can cause thinning of the skin.  Topical steroids (such as triamcinolone, fluocinolone, fluocinonide, mometasone, clobetasol, halobetasol, betamethasone, hydrocortisone) can cause thinning and lightening of the skin if they are used for too long in the same area. Your physician has selected the right strength medicine for your problem and area affected on the body. Please use your medication only as directed by your physician to prevent side effects.   Recommend mild soap and moisturizing cream 1-2 times daily.  Gentle skin care handout provided.    Recommend OTC [place] Rapid Relief Anti-Itch cream (pramoxine + menthol), CeraVe Anti-itch cream or lotion (pramoxine), Sarna lotion (Original- menthol + camphor or Sensitive- pramoxine) or Eucerin 12 hour Itch Relief lotion (menthol) up to 3 times per day to areas on body that are itchy.  mometasone (ELOCON) 0.1 % cream - abdomen and hips Apply twice daily to affected areas on body up to 2 weeks as needed for itching. Avoid applying to face, groin, and axilla.   Return in about 6 months (around 12/22/2022) for TBSE, HxBCC, SCC's, AK's.  I, Emelia Salisbury, CMA, am acting as scribe for Brendolyn Patty, MD.  Documentation: I have reviewed the above documentation for accuracy and completeness, and I agree with the above.  Brendolyn Patty MD

## 2022-06-21 NOTE — Patient Instructions (Addendum)
Cryotherapy Aftercare  Wash gently with soap and water everyday.   Apply Vaseline daily until healed.   Start Mometasone cream twice daily to affected body areas for up to 2 weeks as needed for rash/itching. Avoid applying to face, groin, and axilla. Use as directed. Long-term use can cause thinning of the skin.  Topical steroids (such as triamcinolone, fluocinolone, fluocinonide, mometasone, clobetasol, halobetasol, betamethasone, hydrocortisone) can cause thinning and lightening of the skin if they are used for too long in the same area. Your physician has selected the right strength medicine for your problem and area affected on the body. Please use your medication only as directed by your physician to prevent side effects.   Recommend OTC Gold Bond Rapid Relief Anti-Itch cream (pramoxine + menthol), CeraVe Anti-itch cream or lotion (pramoxine), Sarna lotion (Original- menthol + camphor or Sensitive- pramoxine) or Eucerin 12 hour Itch Relief lotion (menthol) up to 3 times per day to areas on body that are itchy.   Gentle Skin Care Guide  1. Bathe no more than once a day.  2. Avoid bathing in hot water  3. Use a mild soap like Dove, Vanicream, Cetaphil, CeraVe. Can use Lever 2000 or Cetaphil antibacterial soap  4. Use soap only where you need it. On most days, use it under your arms, between your legs, and on your feet. Let the water rinse other areas unless visibly dirty.  5. When you get out of the bath/shower, use a towel to gently blot your skin dry, don't rub it.  6. While your skin is still a little damp, apply a moisturizing cream such as Vanicream, CeraVe, Cetaphil, Eucerin, Sarna lotion or plain Vaseline Jelly. For hands apply Neutrogena [place] Hand Cream or Excipial Hand Cream.  7. Reapply moisturizer any time you start to itch or feel dry.  8. Sometimes using free and clear laundry detergents can be helpful. Fabric softener sheets should be avoided. Downy Free & Gentle  liquid, or any liquid fabric softener that is free of dyes and perfumes, it acceptable to use  9. If your doctor has given you prescription creams you may apply moisturizers over them      Recommend daily broad spectrum sunscreen SPF 30+ to sun-exposed areas, reapply every 2 hours as needed. Call for new or changing lesions.  Staying in the shade or wearing long sleeves, sun glasses (UVA+UVB protection) and wide brim hats (4-inch brim around the entire circumference of the hat) are also recommended for sun protection.    Melanoma ABCDEs  Melanoma is the most dangerous type of skin cancer, and is the leading cause of death from skin disease.  You are more likely to develop melanoma if you: Have light-colored skin, light-colored eyes, or red or blond hair Spend a lot of time in the sun Tan regularly, either outdoors or in a tanning bed Have had blistering sunburns, especially during childhood Have a close family member who has had a melanoma Have atypical moles or large birthmarks  Early detection of melanoma is key since treatment is typically straightforward and cure rates are extremely high if we catch it early.   The first sign of melanoma is often a change in a mole or a new dark spot.  The ABCDE system is a way of remembering the signs of melanoma.  A for asymmetry:  The two halves do not match. B for border:  The edges of the growth are irregular. C for color:  A mixture of colors are present instead  of an even brown color. D for diameter:  Melanomas are usually (but not always) greater than 21m - the size of a pencil eraser. E for evolution:  The spot keeps changing in size, shape, and color.  Please check your skin once per month between visits. You can use a small mirror in front and a large mirror behind you to keep an eye on the back side or your body.   If you see any new or changing lesions before your next follow-up, please call to schedule a visit.  Please continue  daily skin protection including broad spectrum sunscreen SPF 30+ to sun-exposed areas, reapplying every 2 hours as needed when you're outdoors.   Staying in the shade or wearing long sleeves, sun glasses (UVA+UVB protection) and wide brim hats (4-inch brim around the entire circumference of the hat) are also recommended for sun protection.     Due to recent changes in healthcare laws, you may see results of your pathology and/or laboratory studies on MyChart before the doctors have had a chance to review them. We understand that in some cases there may be results that are confusing or concerning to you. Please understand that not all results are received at the same time and often the doctors may need to interpret multiple results in order to provide you with the best plan of care or course of treatment. Therefore, we ask that you please give u[place]2 business days to thoroughly review all your results before contacting the office for clarification. Should we see a critical lab result, you will be contacted sooner.   If You Need Anything After Your Visit  If you have any questions or concerns for your doctor, please call our main line at [phone] press option 4 to reach your doctor's medical assistant. If no one answers, please leave a voicemail as directed and we will return your call as soon as possible. Messages left after 4 pm will be answered the following business day.   You may also send [place] message via [place] We typically respond to MyChart messages within 1-2 business days.  For prescription refills, please ask your pharmacy to contact our office. Our fax number is [phone]  If you have an urgent issue when the clinic is closed that cannot wait until the next business day, you can page your doctor at the number below.    Please note that while we do our best to be available for urgent issues outside of office hours, we are not available 24/7.   If you have an urgent issue and  are unable to reach u[place] you may choose to seek medical care at your doctor's office, retail clinic, urgent care center, or emergency room.  If you have a medical emergency, please immediately call 911 or go to the emergency department.  Pager Numbers  - Dr. KNehemiah Massed [phone] - Dr. MLaurence Ferrari [phone] - Dr. SNicole Kindred [phone] In the event of inclement weather, please call our main line at [phone] an update on the status of any delays or closures.  Dermatology Medication Tips: Please keep the boxes that topical medications come in in order to help keep track of the instructions about where and how to use these. Pharmacies typically print the medication instructions only on the boxes and not directly on the medication tubes.   If your medication is too expensive, please contact our office at [phone] 4 or send u[place] message through [facility]  We are unable to tell what your co-pay for medications will be in advance as this is different depending on your insurance coverage. However, we may be able to find a substitute medication at lower cost or fill out paperwork to get insurance to cover a needed medication.   If a prior authorization is required to get your medication covered by your insurance company, please allow [place] 1-2 business days to complete this process.  Drug prices often vary depending on where the prescription is filled and some pharmacies may offer cheaper prices.  The website www.goodrx.com contains coupons for medications through different pharmacies. The prices here do not account for what the cost may be with help from insurance (it may be cheaper with your insurance), but the website can give you the price if you did not use any insurance.  - You can print the associated coupon and take it with your prescription to the pharmacy.  - You may also stop by our office during regular business hours and pick up a GoodRx coupon card.  - If you need your  prescription sent electronically to a different pharmacy, notify our office through [facility] or by phone at [phone] option 4.     Si Usted Necesita Algo Despus de Su Visita  Tambin puede enviarnos un mensaje a travs de Pharmacist, community. Por lo general respondemos a los mensajes de MyChart en el transcurso de 1 a 2 das hbiles.  Para renovar recetas, por favor pida a su farmacia que se ponga en contacto con nuestra oficina. Harland Dingwall de fax es [place] [phone].  Si tiene un asunto urgente cuando la clnica est cerrada y que no puede esperar hasta el siguiente da hbil, puede llamar/localizar a su doctor(a) al nmero que aparece a continuacin.   Por favor, tenga en cuenta que aunque hacemos todo lo posible para estar disponibles para asuntos urgentes fuera del horario de [place], no estamos disponibles las 24 horas del da, los 7 das de la [place].   Si tiene un problema urgente y no puede comunicarse con nosotros, puede optar por buscar atencin mdica  en el consultorio de su doctor(a), en una clnica privada, en un centro de atencin urgente o en una sala de emergencias.  Si tiene Engineering geologist, por favor llame inmediatamente al 911 o vaya a la sala de emergencias.  Nmeros de bper  - Dr. Nehemiah Massed: [phone]  - Dra. Moye: [phone]  - Dra. Nicole Kindred: [phone]  En caso de inclemencias del [place], por favor llame a Johnsie Kindred principal al [phone] para una actualizacin sobre el [place] de cualquier retraso o cierre.  Consejos para la medicacin en dermatologa: Por favor, guarde las cajas en las que vienen los medicamentos de uso tpico para ayudarle a seguir las instrucciones sobre dnde y cmo usarlos. Las farmacias generalmente imprimen las instrucciones del medicamento slo en las cajas y no directamente en los tubos del [place].   Si su medicamento es muy caro, por favor, pngase en contacto con Zigmund Daniel llamando al [phone]  y presione la opcin 4 o envenos un mensaje a travs de Pharmacist, community.   No podemos decirle cul ser su copago por los medicamentos por adelantado ya que esto es diferente dependiendo de la cobertura de su seguro. Sin embargo, es posible que podamos encontrar un medicamento sustituto a Electrical engineer un formulario para que el seguro cubra el medicamento que se considera necesario.   Si se requiere una autorizacin previa para que su  compaa de seguros [place] su medicamento, por favor permtanos de 1 a 2 das hbiles para completar este proceso.  Los precios de los medicamentos varan con frecuencia dependiendo del Environmental consultant de dnde se surte la receta y alguna farmacias pueden ofrecer precios ms baratos.  El sitio web www.goodrx.com tiene cupones para medicamentos de Airline pilot. Los precios aqu no tienen en cuenta lo que podra costar con la ayuda del seguro (puede ser ms barato con su seguro), pero el sitio web puede darle el precio si no utiliz Research scientist (physical sciences).  - Puede imprimir el cupn correspondiente y llevarlo con su receta a la farmacia.  - Tambin puede pasar por nuestra oficina durante el horario de atencin regular y Charity fundraiser una tarjeta de cupones de GoodRx.  - Si necesita que su receta se enve electrnicamente a una farmacia diferente, informe a nuestra oficina a travs de MyChart de [place] o por telfono llamando al [phone] y presione la opcin 4.

## 2023-01-10 ENCOUNTER — Ambulatory Visit: Payer: Medicare PPO | Admitting: Dermatology

## 2023-01-10 DIAGNOSIS — L821 Other seborrheic keratosis: Secondary | ICD-10-CM | POA: Diagnosis not present

## 2023-01-10 DIAGNOSIS — L570 Actinic keratosis: Secondary | ICD-10-CM

## 2023-01-10 DIAGNOSIS — L578 Other skin changes due to chronic exposure to nonionizing radiation: Secondary | ICD-10-CM

## 2023-01-10 DIAGNOSIS — L300 Nummular dermatitis: Secondary | ICD-10-CM | POA: Diagnosis not present

## 2023-01-10 DIAGNOSIS — L814 Other melanin hyperpigmentation: Secondary | ICD-10-CM

## 2023-01-10 DIAGNOSIS — Z1283 Encounter for screening for malignant neoplasm of skin: Secondary | ICD-10-CM

## 2023-01-10 DIAGNOSIS — Z85828 Personal history of other malignant neoplasm of skin: Secondary | ICD-10-CM

## 2023-01-10 DIAGNOSIS — L309 Dermatitis, unspecified: Secondary | ICD-10-CM | POA: Diagnosis not present

## 2023-01-10 DIAGNOSIS — D229 Melanocytic nevi, unspecified: Secondary | ICD-10-CM

## 2023-01-10 DIAGNOSIS — T148XXA Other injury of unspecified body region, initial encounter: Secondary | ICD-10-CM

## 2023-01-10 MED ORDER — MOMETASONE FUROATE 0.1 % EX CREA: 1.0000 | TOPICAL_CREAM | Freq: Two times a day (BID) | CUTANEOUS | 0 refills | Status: DC

## 2023-01-10 MED ORDER — MUPIROCIN 2 % EX OINT: 1.0000 | TOPICAL_OINTMENT | Freq: Two times a day (BID) | CUTANEOUS | 0 refills | Status: DC

## 2023-01-10 NOTE — Progress Notes (Unsigned)
Follow-Up Visit   Subjective  Lori Wallace is a 87 y.o. female who presents for the following: TBSE (The patient presents for Total-Body Skin Exam (TBSE) for skin cancer screening and mole check.  The patient has spots, moles and lesions to be evaluated, some may be new or changing and the patient has concerns that these could be cancer. Hx SCC, BCC, AK's. /).  Patient with a spot at left nose, irritated from a runny nose and has been picking at. Present for > 3 weeks.  The following portions of the chart were reviewed this encounter and updated as appropriate:       Review of Systems:  No other skin or systemic complaints except as noted in HPI or Assessment and Plan.  Objective  Well appearing patient in no apparent distress; mood and affect are within normal limits.  A full examination was performed including scalp, head, eyes, ears, nose, lips, neck, chest, axillae, abdomen, back, buttocks, bilateral upper extremities, bilateral lower extremities, hands, feet, fingers, toes, fingernails, and toenails. All findings within normal limits unless otherwise noted below.  left nostril Pink edematous patch with crusting  right forearm, right medial calf, right posterior upper arm, left hand dorsum, right hand dorsum, left upper arm x 2, right chest x 2, left medial cheek (10) Erythematous thin papules/macules with gritty scale.   left upper interior thigh Stuck-on, waxy, tan-brown papules and plaques -- Discussed benign etiology and prognosis.   Right Ankle Crusted excoriation    Assessment & Plan  Dermatitis left nostril  /Irritant Dermatitis  Discontinue Vicks and other over the counter topicals  Start mometasone cream to affected area at nose and follow with mupirocin ointment twice daily for up to 2 weeks as needed. May decrease to once daily as improving.   mupirocin ointment (BACTROBAN) 2 % - left nostril Apply 1 Application topically 2 (two) times  daily.  mometasone (ELOCON) 0.1 % cream - left nostril Apply 1 Application topically 2 (two) times daily.  AK (actinic keratosis) (10) right forearm, right medial calf, right posterior upper arm, left hand dorsum, right hand dorsum, left upper arm x 2, right chest x 2, left medial cheek  Vs ISK Hypertrophic at left forearm, right medial calf, right posterior upper arm, left hand dorsum, right hand dorsum, right chest x 2, left medial cheek   Actinic keratoses are precancerous spots that appear secondary to cumulative UV radiation exposure/sun exposure over time. They are chronic with expected duration over 1 year. A portion of actinic keratoses will progress to squamous cell carcinoma of the skin. It is not possible to reliably predict which spots will progress to skin cancer and so treatment is recommended to prevent development of skin cancer.  Recommend daily broad spectrum sunscreen SPF 30+ to sun-exposed areas, reapply every 2 hours as needed.  Recommend staying in the shade or wearing long sleeves, sun glasses (UVA+UVB protection) and wide brim hats (4-inch brim around the entire circumference of the hat). Call for new or changing lesions.   Nummular dermatitis Back  Start mometasone cream 1-2 times daily to affected areas at back as needed for itch.   Related Medications mometasone (ELOCON) 0.1 % cream Apply twice daily to affected areas on body up to 2 weeks as needed for itching. Avoid applying to face, groin, and axilla.  Seborrheic keratosis left upper interior thigh  Benign-appearing.  Observation.  Call clinic for new or changing lesions.  Recommend daily use of broad spectrum spf 30+  sunscreen to sun-exposed areas.    Excoriation Right Ankle  Recommend mupirocin and cover with band aid daily.    History of Basal Cell Carcinoma of the Skin - No evidence of recurrence today - Recommend regular full body skin exams - Recommend daily broad spectrum sunscreen SPF  30+ to sun-exposed areas, reapply every 2 hours as needed.  - Call if any new or changing lesions are noted between office visits  History of Squamous Cell Carcinoma of the Skin - No evidence of recurrence today - No lymphadenopathy - Recommend regular full body skin exams - Recommend daily broad spectrum sunscreen SPF 30+ to sun-exposed areas, reapply every 2 hours as needed.  - Call if any new or changing lesions are noted between office visits  Lentigines - Scattered tan macules - Due to sun exposure - Benign-appearing, observe - Recommend daily broad spectrum sunscreen SPF 30+ to sun-exposed areas, reapply every 2 hours as needed. - Call for any changes  Seborrheic Keratoses - Stuck-on, waxy, tan-brown papules and/or plaques  - Benign-appearing - Discussed benign etiology and prognosis. - Observe - Call for any changes  Melanocytic Nevi - Tan-brown and/or pink-flesh-colored symmetric macules and papules - Benign appearing on exam today - Observation - Call clinic for new or changing moles - Recommend daily use of broad spectrum spf 30+ sunscreen to sun-exposed areas.   Hemangiomas - Red papules - Discussed benign nature - Observe - Call for any changes  Actinic Damage - Chronic condition, secondary to cumulative UV/sun exposure - diffuse scaly erythematous macules with underlying dyspigmentation - Recommend daily broad spectrum sunscreen SPF 30+ to sun-exposed areas, reapply every 2 hours as needed.  - Staying in the shade or wearing long sleeves, sun glasses (UVA+UVB protection) and wide brim hats (4-inch brim around the entire circumference of the hat) are also recommended for sun protection.  - Call for new or changing lesions.  Skin cancer screening performed today.  Return in about 6 months (around 07/13/2023) for AK follow up.  Graciella Belton, RMA, am acting as scribe for Brendolyn Patty, MD .

## 2023-01-10 NOTE — Patient Instructions (Addendum)
For nose - Discontinue Vicks and other over the counter topicals Start mometasone cream to affected area at nose and follow with mupirocin ointment twice daily for up to 2 weeks as needed. May decrease to once daily as improving.  Start mometasone cream 1-2 times daily to affected areas at back as needed for itch.   Cryotherapy Aftercare  Wash gently with soap and water everyday.   Apply Vaseline and Band-Aid daily until healed.    Seborrheic Keratosis  What causes seborrheic keratoses? Seborrheic keratoses are harmless, common skin growths that first appear during adult life.  As time goes by, more growths appear.  Some people may develop a large number of them.  Seborrheic keratoses appear on both covered and uncovered body parts.  They are not caused by sunlight.  The tendency to develop seborrheic keratoses can be inherited.  They vary in color from skin-colored to gray, brown, or even black.  They can be either smooth or have a rough, warty surface.   Seborrheic keratoses are superficial and look as if they were stuck on the skin.  Under the microscope this type of keratosis looks like layers upon layers of skin.  That is why at times the top layer may seem to fall off, but the rest of the growth remains and re-grows.    Treatment Seborrheic keratoses do not need to be treated, but can easily be removed in the office.  Seborrheic keratoses often cause symptoms when they rub on clothing or jewelry.  Lesions can be in the way of shaving.  If they become inflamed, they can cause itching, soreness, or burning.  Removal of a seborrheic keratosis can be accomplished by freezing, burning, or surgery. If any spot bleeds, scabs, or grows rapidly, please return to have it checked, as these can be an indication of a skin cancer.   Melanoma ABCDEs  Melanoma is the most dangerous type of skin cancer, and is the leading cause of death from skin disease.  You are more likely to develop melanoma if  you: Have light-colored skin, light-colored eyes, or red or blond hair Spend a lot of time in the sun Tan regularly, either outdoors or in a tanning bed Have had blistering sunburns, especially during childhood Have a close family member who has had a melanoma Have atypical moles or large birthmarks  Early detection of melanoma is key since treatment is typically straightforward and cure rates are extremely high if we catch it early.   The first sign of melanoma is often a change in a mole or a new dark spot.  The ABCDE system is a way of remembering the signs of melanoma.  A for asymmetry:  The two halves do not match. B for border:  The edges of the growth are irregular. C for color:  A mixture of colors are present instead of an even brown color. D for diameter:  Melanomas are usually (but not always) greater than 71m - the size of a pencil eraser. E for evolution:  The spot keeps changing in size, shape, and color.  Please check your skin once per month between visits. You can use a small mirror in front and a large mirror behind you to keep an eye on the back side or your body.   If you see any new or changing lesions before your next follow-up, please call to schedule a visit.  Please continue daily skin protection including broad spectrum sunscreen SPF 30+ to sun-exposed areas, reapplying every  2 hours as needed when you're outdoors.    Due to recent changes in healthcare laws, you may see results of your pathology and/or laboratory studies on MyChart before the doctors have had a chance to review them. We understand that in some cases there may be results that are confusing or concerning to you. Please understand that not all results are received at the same time and often the doctors may need to interpret multiple results in order to provide you with the best plan of care or course of treatment. Therefore, we ask that you please give [place] 2 business days to thoroughly review all your  results before contacting the office for clarification. Should we see a critical lab result, you will be contacted sooner.   If You Need Anything After Your Visit  If you have any questions or concerns for your doctor, please call our main line at [phone] and press option 4 to reach your doctor's medical assistant. If no one answers, please leave a voicemail as directed and we will return your call as soon as possible. Messages left after 4 pm will be answered the following business day.   You may also send [place] a message via [place]. We typically respond to MyChart messages within 1-2 business days.  For prescription refills, please ask your pharmacy to contact our office. Our fax number is [phone].  If you have an urgent issue when the clinic is closed that cannot wait until the next business day, you can page your doctor at the number below.    Please note that while we do our best to be available for urgent issues outside of office hours, we are not available 24/7.   If you have an urgent issue and are unable to reach [place], you may choose to seek medical care at your doctor's office, retail clinic, urgent care center, or emergency room.  If you have a medical emergency, please immediately call 911 or go to the emergency department.  Pager Numbers  - Dr. Nehemiah Massed: [phone]  - Dr. Laurence Ferrari: [phone]  - Dr. Nicole Kindred: [phone]  In the event of inclement weather, please call our main line at [phone] for an update on the status of any delays or closures.  Dermatology Medication Tips: Please keep the boxes that topical medications come in in order to help keep track of the instructions about where and how to use these. Pharmacies typically print the medication instructions only on the boxes and not directly on the medication tubes.   If your medication is too expensive, please contact our office at [phone] option 4 or send [place] a message through [place].   We are  unable to tell what your co-pay for medications will be in advance as this is different depending on your insurance coverage. However, we may be able to find a substitute medication at lower cost or fill out paperwork to get insurance to cover a needed medication.   If a prior authorization is required to get your medication covered by your insurance company, please allow [place] 1-2 business days to complete this process.  Drug prices often vary depending on where the prescription is filled and some pharmacies may offer cheaper prices.  The website www.goodrx.com contains coupons for medications through different pharmacies. The prices here do not account for what the cost may be with help from insurance (it may be cheaper with your insurance), but the website can give you the price if you did not use any insurance.  -  You can print the associated coupon and take it with your prescription to the pharmacy.  - You may also stop by our office during regular business hours and pick up a GoodRx coupon card.  - If you need your prescription sent electronically to a different pharmacy, notify our office through [facility] or by phone at [phone] option 4.     Si Usted Necesita Algo Despus de Su Visita  Tambin puede enviarnos un mensaje a travs de Pharmacist, community. Por lo general respondemos a los mensajes de MyChart en el transcurso de 1 a 2 das hbiles.  Para renovar recetas, por favor pida a su farmacia que se ponga en contacto con nuestra oficina. Harland Dingwall de fax es [place] [phone].  Si tiene un asunto urgente cuando la clnica est cerrada y que no puede esperar hasta el siguiente da hbil, puede llamar/localizar a su doctor(a) al nmero que aparece a continuacin.   Por favor, tenga en cuenta que aunque hacemos todo lo posible para estar disponibles para asuntos urgentes fuera del horario de [place], no estamos disponibles las 24 horas del da, los 7 das de la [place].   Si tiene un  problema urgente y no puede comunicarse con nosotros, puede optar por buscar atencin mdica  en el consultorio de su doctor(a), en una clnica privada, en un centro de atencin urgente o en una sala de emergencias.  Si tiene Engineering geologist, por favor llame inmediatamente al 911 o vaya a la sala de emergencias.  Nmeros de bper  - Dr. Nehemiah Massed: [phone]  - Dra. Moye: [phone]  - Dra. Nicole Kindred: [phone]  En caso de inclemencias del [place], por favor llame a Johnsie Kindred principal al [phone] para una actualizacin sobre el [place] de cualquier retraso o cierre.  Consejos para la medicacin en dermatologa: Por favor, guarde las cajas en las que vienen los medicamentos de uso tpico para ayudarle a seguir las instrucciones sobre dnde y cmo usarlos. Las farmacias generalmente imprimen las instrucciones del medicamento slo en las cajas y no directamente en los tubos del [place].   Si su medicamento es muy caro, por favor, pngase en contacto con Zigmund Daniel llamando al [phone] y presione la opcin 4 o envenos un mensaje a travs de Pharmacist, community.   No podemos decirle cul ser su copago por los medicamentos por adelantado ya que esto es diferente dependiendo de la cobertura de su seguro. Sin embargo, es posible que podamos encontrar un medicamento sustituto a Electrical engineer un formulario para que el seguro cubra el medicamento que se considera necesario.   Si se requiere una autorizacin previa para que su compaa de seguros [place] su medicamento, por favor permtanos de 1 a 2 das hbiles para completar este proceso.  Los precios de los medicamentos varan con frecuencia dependiendo del Environmental consultant de dnde se surte la receta y alguna farmacias pueden ofrecer precios ms baratos.  El sitio web www.goodrx.com tiene cupones para medicamentos de Airline pilot. Los precios aqu no tienen en cuenta lo que podra costar con la ayuda del seguro (puede ser ms  barato con su seguro), pero el sitio web puede darle el precio si no utiliz Research scientist (physical sciences).  - Puede imprimir el cupn correspondiente y llevarlo con su receta a la farmacia.  - Tambin puede pasar por nuestra oficina durante el horario de atencin regular y Charity fundraiser una tarjeta de cupones de GoodRx.  - Si necesita que su receta se enve electrnicamente a una farmacia diferente, informe  a nuestra oficina a travs de MyChart de [place] o por telfono llamando al [phone] y presione la opcin 4.

## 2023-02-23 IMAGING — CT CT HEAD W/O CM
3 series · 15 of 47 positions shown, 18 images · non-contrast
Comparison: 04/25/2021

CLINICAL DATA: Fall

EXAM:
CT HEAD WITHOUT CONTRAST
TECHNIQUE: Contiguous axial images were obtained from the base of the skull
through the vertex without intravenous contrast.

[Series 2: head bone · axial · 0.42mm/px · z∈[+152,+284]mm · 9 of 78 slices shown, 12 images]
[im 6/78  brain]
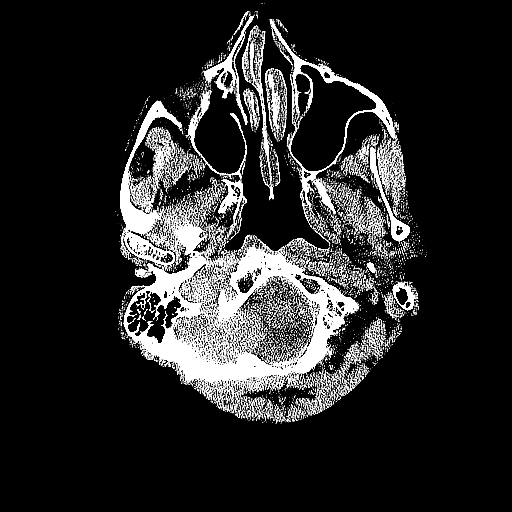
[im 6/78  bone]
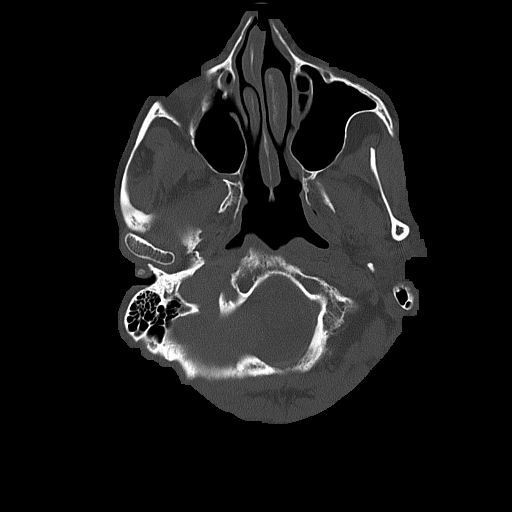
[im 14/78  brain]
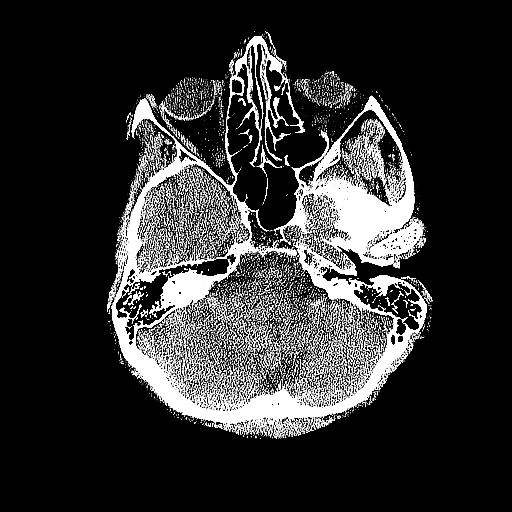
[im 22/78  brain]
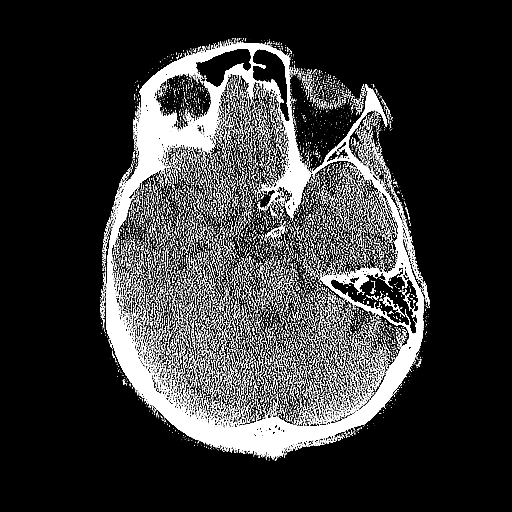
[im 30/78  brain]
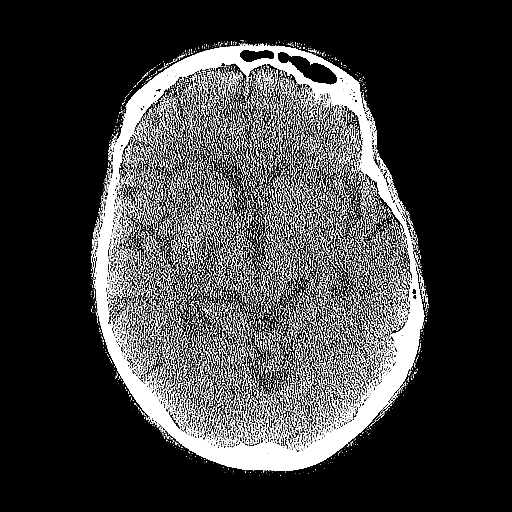
[im 40/78  brain]
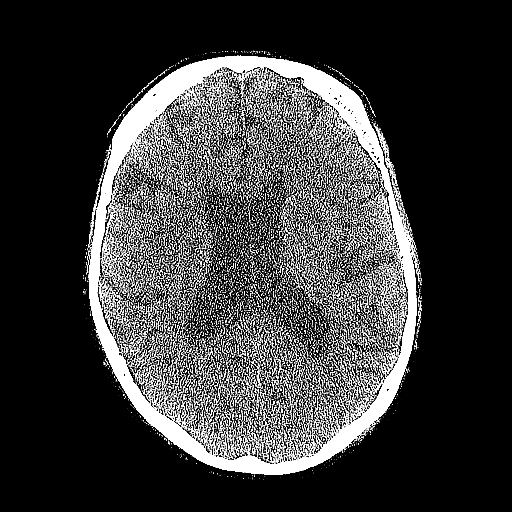
[im 40/78  bone]
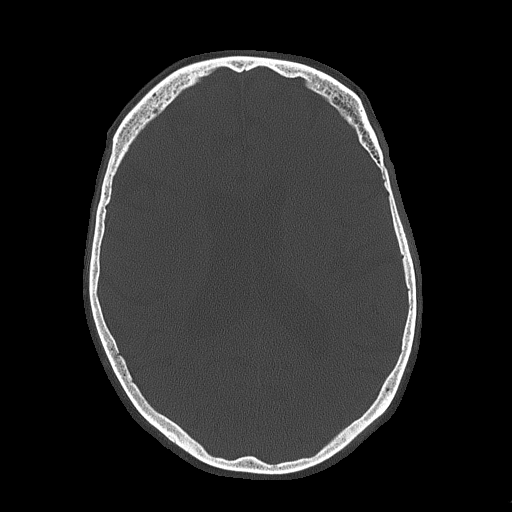
[im 48/78  brain]
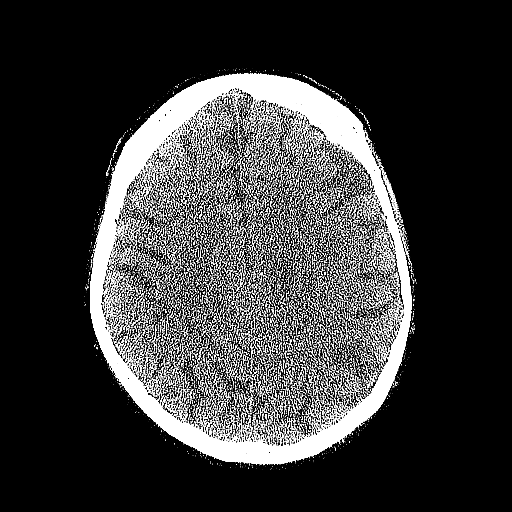
[im 56/78  brain]
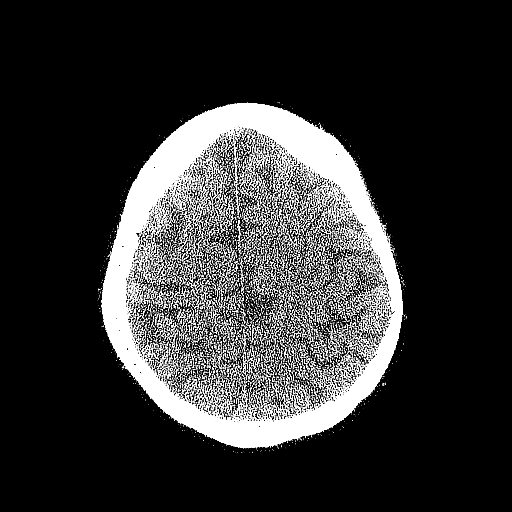
[im 64/78  brain]
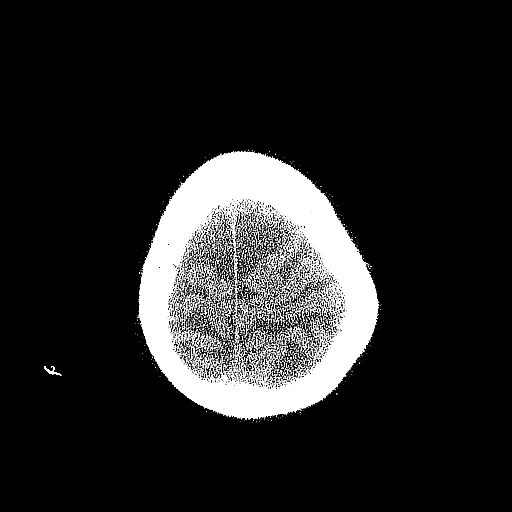
[im 72/78  brain]
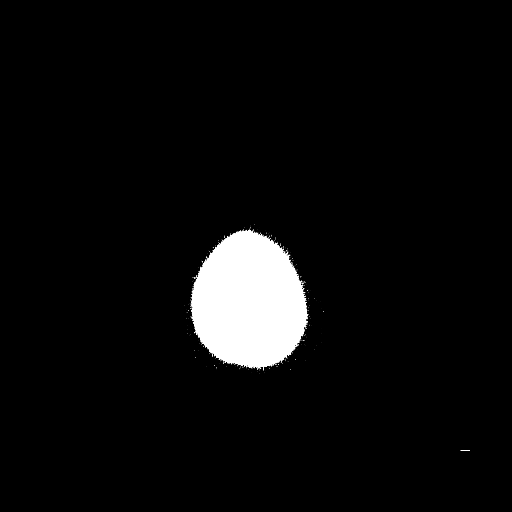
[im 72/78  bone]
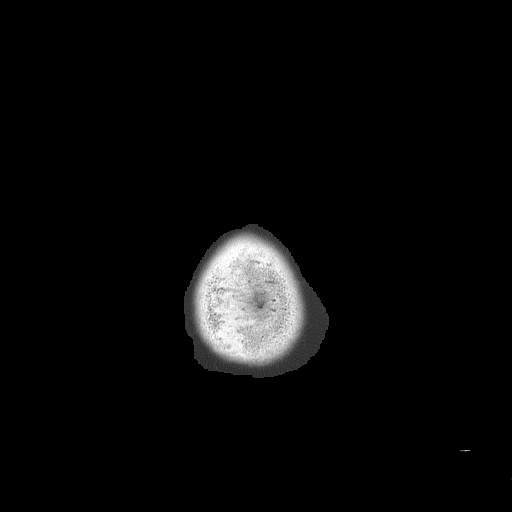

[Series 4: coronal soft tissue · coronal · 0.31mm/px · 3 of 66 slices shown]
[im 22/66  brain]
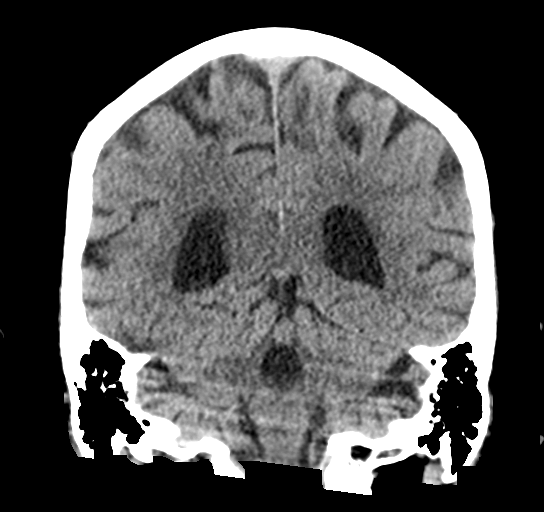
[im 29/66  brain]
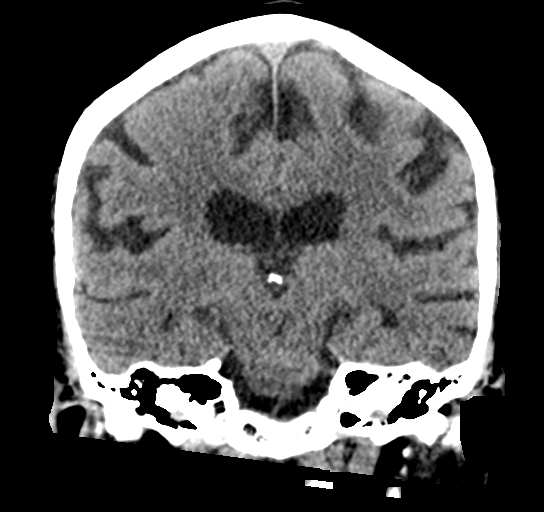
[im 37/66  brain]
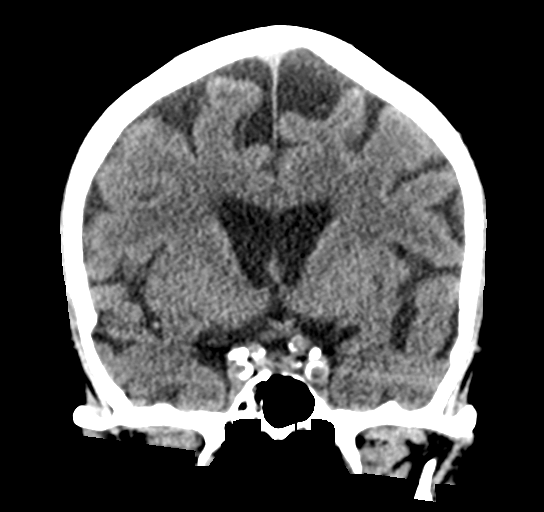

[Series 5: sagittal soft tissue · sagittal · 0.31mm/px · 3 of 51 slices shown]
[im 18/51  brain]
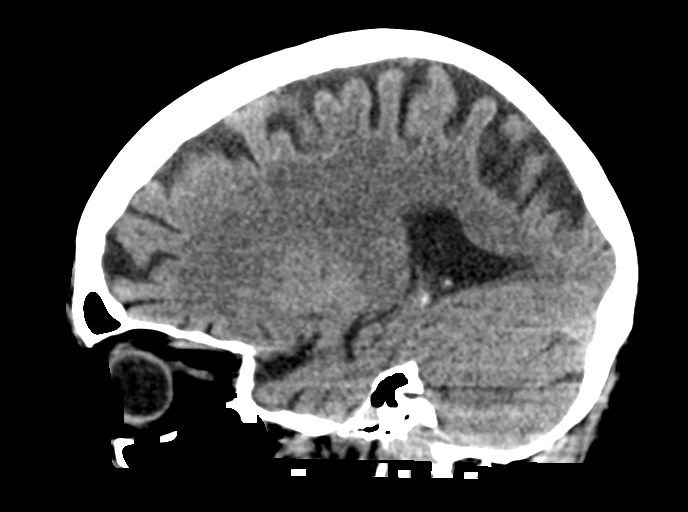
[im 26/51  brain]
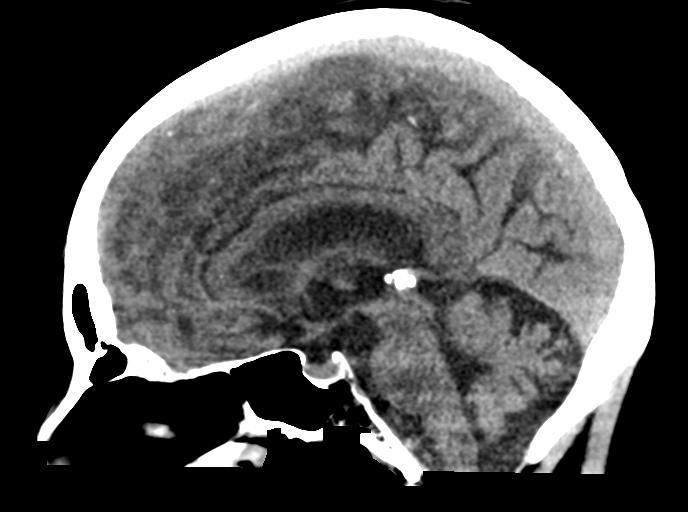
[im 34/51  brain]
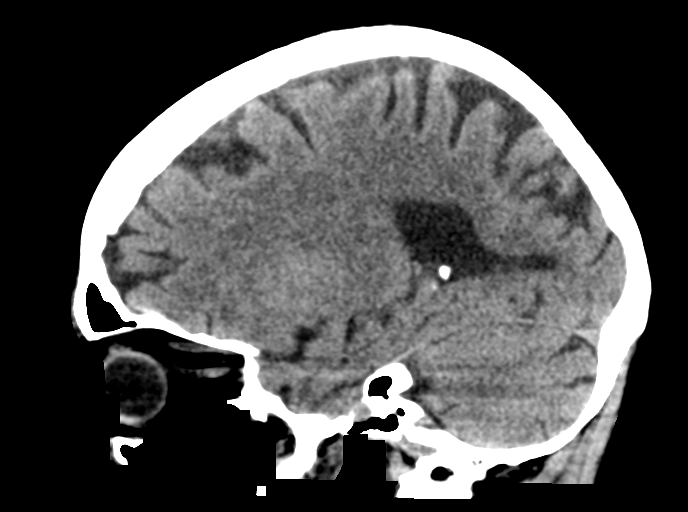

[15 of 47 positions shown; findings below may reference images not displayed]

FINDINGS: Brain: There is no acute intracranial hemorrhage, mass effect, or
edema. Gray-white differentiation is preserved. There is no
extra-axial fluid collection. Prominence of the ventricles and sulci
reflects stable parenchymal volume loss. Patchy low-attenuation in
the supratentorial white matter probably reflects stable chronic
microvascular ischemic changes.

Vascular: There is atherosclerotic calcification at the skull base.

Skull: Calvarium is unremarkable.

Sinuses/Orbits: No acute finding.

Other: None.
IMPRESSION: No evidence of acute intracranial injury. No significant change
since recent prior study.

## 2023-04-27 ENCOUNTER — Other Ambulatory Visit: Payer: Self-pay | Admitting: Student

## 2023-04-27 DIAGNOSIS — Z853 Personal history of malignant neoplasm of breast: Secondary | ICD-10-CM

## 2023-04-27 DIAGNOSIS — N644 Mastodynia: Secondary | ICD-10-CM

## 2023-04-27 DIAGNOSIS — Z9889 Other specified postprocedural states: Secondary | ICD-10-CM

## 2023-05-05 ENCOUNTER — Ambulatory Visit
Admission: RE | Admit: 2023-05-05 | Discharge: 2023-05-05 | Disposition: A | Discharge status: Home / Self Care | Payer: Medicare PPO | Source: Ambulatory Visit | Attending: Student | Admitting: Student

## 2023-05-05 DIAGNOSIS — Z9889 Other specified postprocedural states: Secondary | ICD-10-CM | POA: Diagnosis present

## 2023-05-05 DIAGNOSIS — Z853 Personal history of malignant neoplasm of breast: Secondary | ICD-10-CM | POA: Insufficient documentation

## 2023-05-05 DIAGNOSIS — N644 Mastodynia: Secondary | ICD-10-CM | POA: Insufficient documentation

## 2023-07-17 ENCOUNTER — Ambulatory Visit: Payer: Medicare PPO | Admitting: Dermatology

## 2024-06-11 NOTE — Progress Notes (Signed)
 Lori Wallace is a 88 y.o. female who is here for an acute visit.  Her family members have noticed a decline over the last few days. She was unable to get up from a seated position. She needed assistance going to the restroom. She fell last night and bruised her arms. They have noticed that she wasn't using her left leg well. It required tremendous assistance to get her in the car to come to the office today.  Patient Active Problem List  Diagnosis  . Osteoarthritis of both hips  . HTN (hypertension)  . Poorly-controlled hypertension  . Age-related osteoporosis with current pathological fracture with routine healing  . Prediabetes    Past Medical History:  Diagnosis Date  . Allergic rhinitis   . DDD (degenerative disc disease)   . DJD (degenerative joint disease)   . Enthesopathy of hip region   . Essential hypertension, benign   . History of breast cancer    bilateral  . History of hyperthyroidism   . Knee joint replacement by other means   . Osteoarthrosis, unspecified whether generalized or localized, pelvic region and thigh   . Osteoarthrosis, unspecified whether generalized or localized, pelvic region and thigh   . Overactive bladder   . Pure hypercholesterolemia   . Restless legs syndrome (RLS)   . Thyroid disease      Current Outpatient Medications:  .  atenoloL  (TENORMIN ) 25 MG tablet, Take 0.5 tablets (12.5 mg total) by mouth once daily, Disp: 45 tablet, Rfl: 3 .  buPROPion (WELLBUTRIN XL) 150 MG XL tablet, Take 1 tablet (150 mg total) by mouth once daily, Disp: 90 tablet, Rfl: 1 .  cetirizine (ZYRTEC) 10 MG tablet, Take 10 mg by mouth once daily, Disp: , Rfl:  .  cyanocobalamin (VITAMIN B12) 1000 MCG tablet, Take 1,000 mcg by mouth once daily, Disp: , Rfl:  .  docusate (COLACE) 100 MG capsule, Take 200 mg by mouth 2 (two) times daily as needed, Disp: , Rfl:  .  ibuprofen  (MOTRIN ) 200 MG tablet, Take 200 mg by mouth once daily as needed, Disp: , Rfl:  .  omeprazole (PRILOSEC)  40 MG DR capsule, Take 1 capsule (40 mg total) by mouth once daily, Disp: 90 capsule, Rfl: 0 .  pyridoxine, vitamin B6, (VITAMIN B-6) 25 MG tablet, Take 25 mg by mouth once daily, Disp: , Rfl:  .  triamcinolone 0.1 % cream, Apply topically 2 (two) times daily, Disp: 30 g, Rfl: 0 .  ubidecarenone/vitamin E mixed (COQ10 SG 100 ORAL), Take by mouth once daily, Disp: , Rfl:  .  ZINC  ORAL, Take 1 tablet by mouth 3 (three) times a week, Disp: , Rfl:   Allergies  Allergen Reactions  . Carrot Itching  . Demerol [Meperidine] Nausea  . Peanut Itching  . Propoxyphene Nausea And Vomiting    Results for orders placed or performed in visit on 03/12/24  CBC w/auto Differential (5 Part)  Result Value Ref Range   WBC (White Blood Cell Count) 8.5 4.1 - 10.2 10^3/uL   RBC (Red Blood Cell Count) 3.96 (L) 4.04 - 5.48 10^6/uL   Hemoglobin 12.3 12.0 - 15.0 gm/dL   Hematocrit 61.1 64.9 - 47.0 %   MCV (Mean Corpuscular Volume) 98.0 80.0 - 100.0 fl   MCH (Mean Corpuscular Hemoglobin) 31.1 27.0 - 31.2 pg   MCHC (Mean Corpuscular Hemoglobin Concentration) 31.7 (L) 32.0 - 36.0 gm/dL   Platelet Count 694 849 - 450 10^3/uL   RDW-CV (Red Cell Distribution Width) 13.2 11.6 -  14.8 %   MPV (Mean Platelet Volume) 10.0 9.4 - 12.4 fl   Neutrophils 4.85 1.50 - 7.80 10^3/uL   Lymphocytes 2.49 1.00 - 3.60 10^3/uL   Monocytes 0.74 0.00 - 1.50 10^3/uL   Eosinophils 0.32 0.00 - 0.55 10^3/uL   Basophils 0.08 0.00 - 0.09 10^3/uL   Neutrophil % 57.1 32.0 - 70.0 %   Lymphocyte % 29.3 10.0 - 50.0 %   Monocyte % 8.7 4.0 - 13.0 %   Eosinophil % 3.8 1.0 - 5.0 %   Basophil% 0.9 0.0 - 2.0 %   Immature Granulocyte % 0.2 <=0.7 %   Immature Granulocyte Count 0.02 <=0.06 10^3/L  Comprehensive Metabolic Panel (CMP)  Result Value Ref Range   Glucose 101 70 - 110 mg/dL   Sodium 861 863 - 854 mmol/L   Potassium 4.8 3.6 - 5.1 mmol/L   Chloride 104 97 - 109 mmol/L   Carbon Dioxide (CO2) 28.3 22.0 - 32.0 mmol/L   Urea Nitrogen (BUN)  19 7 - 25 mg/dL   Creatinine 1.3 (H) 0.6 - 1.1 mg/dL   Glomerular Filtration Rate (eGFR) 40 (L) >60 mL/min/1.73sq m   Calcium  9.3 8.7 - 10.3 mg/dL   AST  15 8 - 39 U/L   ALT  12 5 - 38 U/L   Alk Phos (alkaline Phosphatase) 87 34 - 104 U/L   Albumin 4.1 3.5 - 4.8 g/dL   Bilirubin, Total 0.5 0.3 - 1.2 mg/dL   Protein, Total 8.1 (H) 6.1 - 7.9 g/dL   A/G Ratio 1.0 1.0 - 5.0 gm/dL  Hemoglobin J8R  Result Value Ref Range   Hemoglobin A1C 5.9 (H) 4.2 - 5.6 %   Average Blood Glucose (Calc) 123 mg/dL   Narrative   Normal Range:    4.2 - 5.6% Increased Risk:  5.7 - 6.4% Diabetes:        >= 6.5% Glycemic Control for adults with diabetes:  <7%      Review of Systems  No fever, chills, nausea, or vomiting  Exam  Vitals:   06/11/24 1139  BP: (!) 140/84  Pulse: 72  SpO2: 98%  Weight: 68 kg (150 lb)  Height: 170.2 cm (5' 7)    Body mass index is 23.49 kg/m.  General: Patient is well-groomed, well-nourished, appears stated age in no acute distress  HEENT: head is atraumatic and normocephalic, neck is supple with no palpable nodules  CV: Regular rhythm and normal heart rate, no murmur, no JVD  Respiratory: Clear to auscultation throughout lung fields with no wheezing, crackles, or rhonchi. No increased work of breathing  Neuro: speech is fluid and clear. Her movement of her upper and lower extremities is equal and symmetric. Her strength is symmetric but weak.   Impression/Plan  Bilateral leg weakness: She is unable to stand up from a seated position. Her exam in the office today was significantly improved compared to what they described witnessing in the last couple days. Her speech is clear at this point. Her strength is equal bilaterally upper and lower extremities but it is weak. It is impossible to know if she had a TIA but based on significant improvement over a short period of time, I think it is likely that this was a TIA. I recommended that she start aspirin  81 mg to help  reduce her future risk. Given her level of debility and bilateral leg weakness, she needs assistance with many of her ADLs. This is currently being provided by her family but I don't  think that will be a sustainable plan longterm. She doesn't leave the house except to come here because it is currently a taxing effort to leave the home. I think doing at home PT is her best chance to avoid a fall that will lead to a hospitalization.  This visit was part of longitudinal care for this patient involving long-term management of chronic medical conditions.

## 2024-06-12 ENCOUNTER — Observation Stay

## 2024-06-12 ENCOUNTER — Observation Stay (HOSPITAL_COMMUNITY)
Admit: 2024-06-12 | Discharge: 2024-06-12 | Disposition: A | Discharge status: Home / Self Care | Attending: Internal Medicine

## 2024-06-12 ENCOUNTER — Inpatient Hospital Stay
Admission: EM | Admit: 2024-06-12 | Discharge: 2024-06-20 | DRG: 065 | Disposition: A | Discharge status: Skilled Nursing Facility | Attending: Student in an Organized Health Care Education/Training Program | Admitting: Student in an Organized Health Care Education/Training Program

## 2024-06-12 ENCOUNTER — Emergency Department

## 2024-06-12 ENCOUNTER — Other Ambulatory Visit: Payer: Self-pay

## 2024-06-12 DIAGNOSIS — Z91010 Allergy to peanuts: Secondary | ICD-10-CM

## 2024-06-12 DIAGNOSIS — G8194 Hemiplegia, unspecified affecting left nondominant side: Secondary | ICD-10-CM | POA: Diagnosis present

## 2024-06-12 DIAGNOSIS — I6389 Other cerebral infarction: Principal | ICD-10-CM | POA: Diagnosis present

## 2024-06-12 DIAGNOSIS — K5900 Constipation, unspecified: Secondary | ICD-10-CM | POA: Diagnosis present

## 2024-06-12 DIAGNOSIS — F03918 Unspecified dementia, unspecified severity, with other behavioral disturbance: Secondary | ICD-10-CM | POA: Diagnosis present

## 2024-06-12 DIAGNOSIS — F0393 Unspecified dementia, unspecified severity, with mood disturbance: Secondary | ICD-10-CM | POA: Diagnosis present

## 2024-06-12 DIAGNOSIS — R531 Weakness: Secondary | ICD-10-CM | POA: Diagnosis not present

## 2024-06-12 DIAGNOSIS — Z7902 Long term (current) use of antithrombotics/antiplatelets: Secondary | ICD-10-CM

## 2024-06-12 DIAGNOSIS — G459 Transient cerebral ischemic attack, unspecified: Principal | ICD-10-CM

## 2024-06-12 DIAGNOSIS — Z1721 Progesterone receptor positive status: Secondary | ICD-10-CM

## 2024-06-12 DIAGNOSIS — Z87891 Personal history of nicotine dependence: Secondary | ICD-10-CM

## 2024-06-12 DIAGNOSIS — Z91018 Allergy to other foods: Secondary | ICD-10-CM

## 2024-06-12 DIAGNOSIS — I10 Essential (primary) hypertension: Secondary | ICD-10-CM | POA: Diagnosis present

## 2024-06-12 DIAGNOSIS — R4701 Aphasia: Secondary | ICD-10-CM | POA: Diagnosis present

## 2024-06-12 DIAGNOSIS — F05 Delirium due to known physiological condition: Secondary | ICD-10-CM | POA: Diagnosis not present

## 2024-06-12 DIAGNOSIS — R1310 Dysphagia, unspecified: Secondary | ICD-10-CM | POA: Diagnosis present

## 2024-06-12 DIAGNOSIS — F32A Depression, unspecified: Secondary | ICD-10-CM | POA: Diagnosis present

## 2024-06-12 DIAGNOSIS — Z885 Allergy status to narcotic agent status: Secondary | ICD-10-CM

## 2024-06-12 DIAGNOSIS — Z170 Estrogen receptor positive status [ER+]: Secondary | ICD-10-CM

## 2024-06-12 DIAGNOSIS — Z7982 Long term (current) use of aspirin: Secondary | ICD-10-CM

## 2024-06-12 DIAGNOSIS — Z888 Allergy status to other drugs, medicaments and biological substances status: Secondary | ICD-10-CM

## 2024-06-12 DIAGNOSIS — Z823 Family history of stroke: Secondary | ICD-10-CM

## 2024-06-12 DIAGNOSIS — Z91048 Other nonmedicinal substance allergy status: Secondary | ICD-10-CM

## 2024-06-12 DIAGNOSIS — R001 Bradycardia, unspecified: Secondary | ICD-10-CM | POA: Diagnosis present

## 2024-06-12 DIAGNOSIS — Z85828 Personal history of other malignant neoplasm of skin: Secondary | ICD-10-CM

## 2024-06-12 DIAGNOSIS — I6521 Occlusion and stenosis of right carotid artery: Secondary | ICD-10-CM | POA: Diagnosis present

## 2024-06-12 DIAGNOSIS — F40240 Claustrophobia: Secondary | ICD-10-CM | POA: Diagnosis present

## 2024-06-12 DIAGNOSIS — D649 Anemia, unspecified: Secondary | ICD-10-CM | POA: Diagnosis present

## 2024-06-12 DIAGNOSIS — R54 Age-related physical debility: Secondary | ICD-10-CM | POA: Diagnosis present

## 2024-06-12 DIAGNOSIS — Z96642 Presence of left artificial hip joint: Secondary | ICD-10-CM | POA: Diagnosis present

## 2024-06-12 DIAGNOSIS — I639 Cerebral infarction, unspecified: Secondary | ICD-10-CM | POA: Diagnosis present

## 2024-06-12 DIAGNOSIS — Z79899 Other long term (current) drug therapy: Secondary | ICD-10-CM

## 2024-06-12 DIAGNOSIS — W06XXXA Fall from bed, initial encounter: Secondary | ICD-10-CM | POA: Diagnosis present

## 2024-06-12 DIAGNOSIS — Z66 Do not resuscitate: Secondary | ICD-10-CM | POA: Diagnosis present

## 2024-06-12 DIAGNOSIS — Z853 Personal history of malignant neoplasm of breast: Secondary | ICD-10-CM

## 2024-06-12 DIAGNOSIS — G4700 Insomnia, unspecified: Secondary | ICD-10-CM | POA: Diagnosis present

## 2024-06-12 DIAGNOSIS — Y92003 Bedroom of unspecified non-institutional (private) residence as the place of occurrence of the external cause: Secondary | ICD-10-CM

## 2024-06-12 HISTORY — DX: Transient cerebral ischemic attack, unspecified: G45.9

## 2024-06-12 LAB — DIFFERENTIAL
Abs Immature Granulocytes: 0.02 K/uL (ref 0.00–0.07)
Basophils Absolute: 0.1 K/uL (ref 0.0–0.1)
Basophils Relative: 1 %
Eosinophils Absolute: 0.6 K/uL — ABNORMAL HIGH (ref 0.0–0.5)
Eosinophils Relative: 7 %
Immature Granulocytes: 0 %
Lymphocytes Relative: 22 %
Lymphs Abs: 2.1 K/uL (ref 0.7–4.0)
Monocytes Absolute: 1 K/uL (ref 0.1–1.0)
Monocytes Relative: 11 %
Neutro Abs: 5.5 K/uL (ref 1.7–7.7)
Neutrophils Relative %: 59 %

## 2024-06-12 LAB — CBC
HCT: 37 % (ref 36.0–46.0)
Hemoglobin: 11.9 g/dL — ABNORMAL LOW (ref 12.0–15.0)
MCH: 31.1 pg (ref 26.0–34.0)
MCHC: 32.2 g/dL (ref 30.0–36.0)
MCV: 96.6 fL (ref 80.0–100.0)
Platelets: 265 K/uL (ref 150–400)
RBC: 3.83 MIL/uL — ABNORMAL LOW (ref 3.87–5.11)
RDW: 13.8 % (ref 11.5–15.5)
WBC: 9.3 K/uL (ref 4.0–10.5)
nRBC: 0 % (ref 0.0–0.2)

## 2024-06-12 LAB — URINALYSIS, W/ REFLEX TO CULTURE (INFECTION SUSPECTED)
Bilirubin Urine: NEGATIVE
Glucose, UA: NEGATIVE mg/dL
Hgb urine dipstick: NEGATIVE
Ketones, ur: NEGATIVE mg/dL
Leukocytes,Ua: NEGATIVE
Nitrite: NEGATIVE
Protein, ur: NEGATIVE mg/dL
Specific Gravity, Urine: 1.004 — ABNORMAL LOW (ref 1.005–1.030)
pH: 6 (ref 5.0–8.0)

## 2024-06-12 LAB — COMPREHENSIVE METABOLIC PANEL WITH GFR
ALT: 15 U/L (ref 0–44)
AST: 20 U/L (ref 15–41)
Albumin: 3.7 g/dL (ref 3.5–5.0)
Alkaline Phosphatase: 63 U/L (ref 38–126)
Anion gap: 8 (ref 5–15)
BUN: 25 mg/dL — ABNORMAL HIGH (ref 8–23)
CO2: 27 mmol/L (ref 22–32)
Calcium: 9.2 mg/dL (ref 8.9–10.3)
Chloride: 104 mmol/L (ref 98–111)
Creatinine, Ser: 1.06 mg/dL — ABNORMAL HIGH (ref 0.44–1.00)
GFR, Estimated: 51 mL/min — ABNORMAL LOW (ref >60)
Glucose, Bld: 94 mg/dL (ref 70–99)
Potassium: 4 mmol/L (ref 3.5–5.1)
Sodium: 139 mmol/L (ref 135–145)
Total Bilirubin: 1 mg/dL (ref 0.0–1.2)
Total Protein: 7.7 g/dL (ref 6.5–8.1)

## 2024-06-12 LAB — PROTIME-INR
INR: 1 (ref 0.8–1.2)
Prothrombin Time: 13.9 s (ref 11.4–15.2)

## 2024-06-12 LAB — CBG MONITORING, ED: Glucose-Capillary: 102 mg/dL — ABNORMAL HIGH (ref 70–99)

## 2024-06-12 LAB — APTT: aPTT: 28 s (ref 24–36)

## 2024-06-12 LAB — ETHANOL: Alcohol, Ethyl (B): <15 mg/dL (ref <15)

## 2024-06-12 MED ORDER — LORATADINE 10 MG PO TABS: 10.0000 mg | ORAL_TABLET | Freq: Every day | ORAL | Status: DC | PRN

## 2024-06-12 MED ORDER — ACETAMINOPHEN 160 MG/5ML PO SOLN: 650.0000 mg | ORAL | Status: DC | PRN

## 2024-06-12 MED ORDER — SODIUM CHLORIDE 0.9 % IV SOLN: INTRAVENOUS | Status: DC

## 2024-06-12 MED ORDER — ZIPRASIDONE MESYLATE 20 MG IM SOLR
10.0000 mg | Freq: Four times a day (QID) | INTRAMUSCULAR | Status: DC | PRN
  Administered 2024-06-12: 10 mg via INTRAMUSCULAR
  Filled 2024-06-12 (×2): qty 20

## 2024-06-12 MED ORDER — ATENOLOL 25 MG PO TABS
25.0000 mg | ORAL_TABLET | Freq: Every day | ORAL | Status: DC
  Filled 2024-06-12: qty 1

## 2024-06-12 MED ORDER — OLANZAPINE 10 MG IM SOLR
5.0000 mg | Freq: Four times a day (QID) | INTRAMUSCULAR | Status: DC | PRN
  Administered 2024-06-12 – 2024-06-16 (×3): 5 mg via INTRAMUSCULAR
  Filled 2024-06-12 (×5): qty 10

## 2024-06-12 MED ORDER — IOHEXOL 350 MG/ML SOLN
75.0000 mL | Freq: Once | INTRAVENOUS | Status: AC | PRN
  Administered 2024-06-12: 75 mL via INTRAVENOUS

## 2024-06-12 MED ORDER — LORAZEPAM 0.5 MG PO TABS
0.5000 mg | ORAL_TABLET | Freq: Four times a day (QID) | ORAL | Status: DC | PRN
  Administered 2024-06-12 – 2024-06-14 (×2): 0.5 mg via ORAL
  Filled 2024-06-12 (×2): qty 1

## 2024-06-12 MED ORDER — STROKE: EARLY STAGES OF RECOVERY BOOK: Freq: Once | Status: AC

## 2024-06-12 MED ORDER — MORPHINE SULFATE (PF) 2 MG/ML IV SOLN
2.0000 mg | INTRAVENOUS | Status: DC | PRN
  Administered 2024-06-12 – 2024-06-13 (×2): 2 mg via INTRAVENOUS
  Filled 2024-06-12 (×2): qty 1

## 2024-06-12 MED ORDER — ENSURE PLUS HIGH PROTEIN PO LIQD
237.0000 mL | Freq: Two times a day (BID) | ORAL | Status: DC
  Administered 2024-06-13 – 2024-06-18 (×13): 237 mL via ORAL

## 2024-06-12 MED ORDER — HYDRALAZINE HCL 20 MG/ML IJ SOLN
5.0000 mg | Freq: Four times a day (QID) | INTRAMUSCULAR | Status: DC | PRN
  Administered 2024-06-12: 5 mg via INTRAVENOUS
  Filled 2024-06-12: qty 1

## 2024-06-12 MED ORDER — ACETAMINOPHEN 650 MG RE SUPP: 650.0000 mg | RECTAL | Status: DC | PRN

## 2024-06-12 MED ORDER — DOCUSATE SODIUM 100 MG PO CAPS
200.0000 mg | ORAL_CAPSULE | Freq: Two times a day (BID) | ORAL | Status: DC
  Administered 2024-06-13 – 2024-06-18 (×13): 200 mg via ORAL
  Filled 2024-06-12 (×14): qty 2

## 2024-06-12 MED ORDER — ENOXAPARIN SODIUM 40 MG/0.4ML IJ SOSY
40.0000 mg | PREFILLED_SYRINGE | INTRAMUSCULAR | Status: DC
  Administered 2024-06-12 – 2024-06-19 (×11): 40 mg via SUBCUTANEOUS
  Filled 2024-06-12 (×8): qty 0.4

## 2024-06-12 MED ORDER — MOMETASONE FUROATE 0.1 % EX CREA: 1.0000 | TOPICAL_CREAM | Freq: Two times a day (BID) | CUTANEOUS | Status: DC

## 2024-06-12 MED ORDER — SENNOSIDES-DOCUSATE SODIUM 8.6-50 MG PO TABS
1.0000 | ORAL_TABLET | Freq: Every evening | ORAL | Status: DC | PRN
Start: 2024-06-12 — End: 2024-06-17

## 2024-06-12 MED ORDER — ACETAMINOPHEN 325 MG PO TABS
650.0000 mg | ORAL_TABLET | ORAL | Status: DC | PRN
Start: 2024-06-12 — End: 2024-06-20
  Administered 2024-06-12 – 2024-06-20 (×12): 650 mg via ORAL
  Filled 2024-06-12 (×8): qty 2

## 2024-06-12 MED ORDER — FENTANYL CITRATE PF 50 MCG/ML IJ SOSY
25.0000 ug | PREFILLED_SYRINGE | Freq: Once | INTRAMUSCULAR | Status: AC
  Administered 2024-06-13: 25 ug via INTRAVENOUS
  Filled 2024-06-12: qty 1

## 2024-06-12 MED ORDER — SODIUM CHLORIDE 0.9% FLUSH: 3.0000 mL | Freq: Once | INTRAVENOUS | Status: DC

## 2024-06-12 MED ORDER — HYDRALAZINE HCL 20 MG/ML IJ SOLN: 5.0000 mg | Freq: Four times a day (QID) | INTRAMUSCULAR | Status: DC | PRN

## 2024-06-12 MED ORDER — DIPHENHYDRAMINE HCL 50 MG/ML IJ SOLN
25.0000 mg | Freq: Once | INTRAMUSCULAR | Status: AC
  Administered 2024-06-13: 25 mg via INTRAVENOUS
  Filled 2024-06-12: qty 1

## 2024-06-12 NOTE — ED Notes (Signed)
 Pt placed on cardiac monitoring late as she was at CT and this RN got 2 EMS when Pt finally arrived. CCMD called.

## 2024-06-12 NOTE — Progress Notes (Signed)
Patient sent for procedure via bed in stable condition.

## 2024-06-12 NOTE — Progress Notes (Addendum)
 SLP Cancellation Note  Patient Details Name: Lori Wallace MRN: [id number] DOB: 1936-09-11   Cancelled treatment:       Reason Eval/Treat Not Completed:  (chart reviewed)  Per chart, pt is a 88 y.o. female with a history of hypertension who comes to the ED due to left-sided weakness and difficulty with speech that started 2 days ago. Had a fall at that time. Saw PCP yesterday, symptoms had improved. Overnight they were worse again with difficulty walking. Noted poor short-term memory also(?).  Initial Head Imaging: No acute intracranial abnormality. 2. Nonspecific hypoattenuation in the periventricular and subcortical white matter, most likely representing chronic small vessel disease.  MRI pending. Pt has only been admitted to the hospital for ~3 hours.  ST services will f/u tomorrow w/ Cognitive-linguistic assessment.     Comer Portugal, MS, CCC-SLP Speech Language Pathologist Rehab Services; [facility] [phone] (ascom) Tyneisha Hegeman 06/12/2024, 3:31 PM

## 2024-06-12 NOTE — ED Notes (Signed)
 This RN read triage note and went and assessed Pt, EDP called to bedside to assess Stroke like Sx.

## 2024-06-12 NOTE — H&P (Addendum)
 History and Physical    Lori Wallace FMW:[id number] DOB: 01-26-1936 DOA: 06/12/2024  PCP: [name], MD (Confirm with patient/family/NH records and if not entered, this has to be entered at [facility] point of entry) Patient coming from: Home  I have personally briefly reviewed patient's old medical records in [facility]  Chief Complaint: Weakness, speech problems  HPI: Lori Wallace is a 88 y.o. female with medical history significant of HTN, skin cancer, breast cancer status post bilateral lumpectomy, presented with strokelike symptoms.  2 nieces gave history at bedside.  Patient lives by herself with one of the nieces believes that has her neighbor.  At baseline she is quite independent uses roller walker to ambulate and feed herself and able to bathe herself.  Last weekend, patient started to have headaches for 3 days and then since Monday, patient was found to have severe weakness more on the left side, family helped her to go to bathroom and she collapsed on her way but no loss of consciousness or head injury.  Monday night, patient fell while getting out of bed first off and hit her left hip.  She was able to get back to her bed and called family.  Family came to her home and found she had weakness on the left side and slurred speech.  Patient was seen by PCP and PCP suspect patient had a TIA and started her on aspirin .  Also yesterday, family noticed the patient shoveling food in her mouth, but not swallowing, and in the evening again family noticed patient had speech problem and confusion and weakness on one side of the limbs and decided to bring her to ED.  Family also reported the patient appears to have memory deficiency developed recently.  ED Course: Afebrile, nontachycardic blood pressure 160/90 O2 saturation 98% on room air.  Blood work showed WBC 10.3 hemoglobin 11.9 BUN 25 creatinine 1.0 glucose 94.  Review of Systems: As per HPI otherwise 14 point review of systems  negative.    Past Medical History:  Diagnosis Date   Actinic keratosis    Arthritis    Breast cancer (HCC) [phone]   Cancer [facility])    breast s/p bilateral lumpectomies with radiation,   Complication of anesthesia    Pt stated her top lip was puffed up and felt strange for weeks after having hip surgery. Pt thinks intubation appliance may have caused it   Constipation due to pain medication    Environmental and seasonal allergies    Frequency of urination    Hx of basal cell carcinoma 08/19/2010   Mid dorsum nose supra tip   Hypertension    pt has not had any blood pressure medications in over 4 years   Malignant neoplasm of upper-outer quadrant of female breast (HCC) 1995   [place]  wide excision, reexcision, whole breast radiation.   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 T1a   Malignant neoplasm of upper-outer quadrant of female breast (HCC) 2002   Right breast   Squamous cell carcinoma of skin 11/23/2007   Right lateral upper eyelid. SCCis   Squamous cell carcinoma of skin 09/03/2019   Right lateral pretibia. EDC.    Squamous cell carcinoma of skin 03/30/2020   R lateral pretibia    Squamous cell carcinoma of skin 03/09/2022   R upper arm, EDC   Trigger finger of both hands  Past Surgical History:  Procedure Laterality Date   BACK SURGERY  1960s   lumbar ruptured discs   BREAST BIOPSY     BREAST LUMPECTOMY Left 1995 with Radiation   BREAST LUMPECTOMY Right 2002   with Radiation   BREAST LUMPECTOMY Left 2011   BREAST SURGERY Bilateral    lumpectomies x 3   COLONOSCOPY  2006   Dr. Viktoria   EYE SURGERY  1990s   bilat cataract removal   FRACTURE SURGERY     L wrist repair, R lower leg   JOINT REPLACEMENT     R knee w/prior arhtroscopy   KYPHOPLASTY N/A 10/23/2014   Procedure: KYPHOPLASTY;  Surgeon: Oneil Rodgers Priestly, MD;  [facility]: [facility]  OR;  Service: Orthopedics;  Laterality: N/A;  T 12 kyphoplasty   TOTAL HIP ARTHROPLASTY  07/30/2012   Procedure: TOTAL HIP ARTHROPLASTY;  Surgeon: Dempsey JINNY Sensor, MD;  Location: [facility] OR;  Service: Orthopedics;  Laterality: Left;   uterine tumor excision     benign   WRIST SURGERY Left      reports that she has quit smoking. She has never used smokeless tobacco. She reports that she does not drink alcohol  and does not use drugs.  Allergies  Allergen Reactions   Carrot [Daucus Carota] Itching    States theres a list of 15 foods that make her itch, but she eats anyway   Demerol [Meperidine] Nausea And Vomiting   Peanut-Containing Drug Products Itching   Propoxyphene Nausea And Vomiting   Tape Itching    Family History  Problem Relation Age of Onset   Cancer Father    Cancer Sister    Stroke Sister    Breast cancer Neg Hx     Prior to Admission medications   Medication Sig Start Date End Date Taking? Authorizing Provider  atenolol  (TENORMIN ) 25 MG tablet Take 25 mg by mouth daily. 04/19/21   [provider]  Calcium -Magnesium -Vitamin D  (CALCIUM  MAGNESIUM  PO) Take 1 tablet by mouth daily.    [provider]  cholecalciferol  (VITAMIN D ) 1000 UNITS tablet Take 1,000 Units by mouth daily.    [provider]  docusate sodium  (COLACE) 100 MG capsule Take 2 capsules (200 mg total) by mouth 2 (two) times daily. 04/28/21   Wouk, Devaughn Sayres, MD  ibuprofen  (ADVIL ) 200 MG tablet Take 200 mg by mouth as needed.    [provider]  loratadine  (CLARITIN ) 10 MG tablet Take 10 mg by mouth daily as needed for allergies.    [provider]  mometasone  (ELOCON ) 0.1 % cream Apply twice daily to affected areas on body up to 2 weeks as needed for itching. Avoid applying to face, groin, and axilla. 06/21/22   Jackquline Sawyer, MD  mometasone  (ELOCON ) 0.1 % cream Apply 1 Application topically 2 (two) times daily. 01/10/23   Jackquline Sawyer, MD  mupirocin  ointment (BACTROBAN )  2 % Apply 1 Application topically 2 (two) times daily. 01/10/23   Jackquline Sawyer, MD  oxyCODONE -acetaminophen  (PERCOCET/ROXICET) 5-325 MG tablet Take 1 tablet by mouth every 4 (four) hours as needed for moderate pain. Patient not taking: Reported on 06/21/2022 04/28/21   Kandis Devaughn Sayres, MD  Polyvinyl Alcohol -Povidone (REFRESH OP) Place 1 drop into both eyes as needed (dry eyes).     [provider]  Probiotic Product (PROBIOTIC DAILY PO) Take 1 tablet by mouth daily. Patient not taking: Reported on 04/25/2021    [provider]  sodium chloride  (OCEAN) 0.65 % SOLN nasal spray Place 1  spray into both nostrils as needed for congestion.    [provider]  Zinc  50 MG TABS Take 50 mg by mouth daily.    [provider]    Physical Exam: Vitals:   06/12/24 1132 06/12/24 1137  BP:  (!) 161/90  Pulse:  61  Resp:  20  Temp:  98.6 F (37 C)  TempSrc:  Oral  SpO2:  99%  Weight: 72.6 kg   Height: 5' 10 (1.778 m)     Constitutional: NAD, calm, comfortable Vitals:   06/12/24 1132 06/12/24 1137  BP:  (!) 161/90  Pulse:  61  Resp:  20  Temp:  98.6 F (37 C)  TempSrc:  Oral  SpO2:  99%  Weight: 72.6 kg   Height: 5' 10 (1.778 m)    Eyes: PERRL, lids and conjunctivae normal ENMT: Mucous membranes are moist. Posterior pharynx clear of any exudate or lesions.Normal dentition.  Neck: normal, supple, no masses, no thyromegaly Respiratory: clear to auscultation bilaterally, no wheezing, no crackles. Normal respiratory effort. No accessory muscle use.  Cardiovascular: Regular rate and rhythm, no murmurs / rubs / gallops. No extremity edema. 2+ pedal pulses. No carotid bruits.  Abdomen: no tenderness, no masses palpated. No hepatosplenomegaly. Bowel sounds positive.  Musculoskeletal: no clubbing / cyanosis. No joint deformity upper and lower extremities. Good ROM, no contractures. Normal muscle tone.  Skin: no rashes, lesions, ulcers. No induration Neurologic:  CN 2-12 grossly intact. Sensation intact, DTR normal. Strength 5/5 in all 4.  Psychiatric: Normal judgment and insight. Alert and oriented x 3. Normal mood.    Labs on Admission: I have personally reviewed following labs and imaging studies  CBC: Recent Labs  Lab 06/12/24 1136  WBC 9.3  NEUTROABS 5.5  HGB 11.9*  HCT 37.0  MCV 96.6  PLT 265   Basic Metabolic Panel: Recent Labs  Lab 06/12/24 1136  NA 139  K 4.0  CL 104  CO2 27  GLUCOSE 94  BUN 25*  CREATININE 1.06*  CALCIUM  9.2   GFR: Estimated Creatinine Clearance: 42 mL/min (A) (by C-G formula based on SCr of 1.06 mg/dL (H)). Liver Function Tests: Recent Labs  Lab 06/12/24 1136  AST 20  ALT 15  ALKPHOS 63  BILITOT 1.0  PROT 7.7  ALBUMIN 3.7   No results for input(s): LIPASE, AMYLASE in the last 168 hours. No results for input(s): AMMONIA in the last 168 hours. Coagulation Profile: Recent Labs  Lab 06/12/24 1136  INR 1.0   Cardiac Enzymes: No results for input(s): CKTOTAL, CKMB, CKMBINDEX, TROPONINI in the last 168 hours. BNP (last 3 results) No results for input(s): PROBNP in the last 8760 hours. HbA1C: No results for input(s): HGBA1C in the last 72 hours. CBG: Recent Labs  Lab 06/12/24 1138  GLUCAP 102*   Lipid Profile: No results for input(s): CHOL, HDL, LDLCALC, TRIG, CHOLHDL, LDLDIRECT in the last 72 hours. Thyroid Function Tests: No results for input(s): TSH, T4TOTAL, FREET4, T3FREE, THYROIDAB in the last 72 hours. Anemia Panel: No results for input(s): VITAMINB12, FOLATE, FERRITIN, TIBC, IRON, RETICCTPCT in the last 72 hours. Urine analysis:    Component Value Date/Time   COLORURINE STRAW (A) 06/12/2024 1206   APPEARANCEUR CLEAR (A) 06/12/2024 1206   APPEARANCEUR Clear 09/24/2014 1532   LABSPEC 1.004 (L) 06/12/2024 1206   LABSPEC 1.004 09/24/2014 1532   PHURINE 6.0 06/12/2024 1206   GLUCOSEU NEGATIVE 06/12/2024 1206   GLUCOSEU  Negative 09/24/2014 1532   HGBUR NEGATIVE 06/12/2024 1206  BILIRUBINUR NEGATIVE 06/12/2024 1206   BILIRUBINUR Negative 09/24/2014 1532   KETONESUR NEGATIVE 06/12/2024 1206   PROTEINUR NEGATIVE 06/12/2024 1206   UROBILINOGEN 0.2 10/21/2014 1259   NITRITE NEGATIVE 06/12/2024 1206   LEUKOCYTESUR NEGATIVE 06/12/2024 1206   LEUKOCYTESUR Negative 09/24/2014 1532    Radiological Exams on Admission: DG Hip Unilat With Pelvis 2-3 Views Left Result Date: 06/12/2024 EXAM: 2 or 3 VIEW(S) XRAY OF THE PELVIS AND LEFT HIP 06/12/2024 12:45:46 PM COMPARISON: 04/25/2021 CLINICAL HISTORY: Fall. FINDINGS: JOINTS: Postoperative changes from previous left total hip arthroplasty. No signs of acute fracture or dislocation. Chronic left superior and inferior pubic rami fractures identified. Degenerative changes noted within the right hip. 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 SOFT TISSUES: Mild osteopenia. IMPRESSION: 1. No acute fracture or dislocation of the left hip. 2. Chronic left superior and inferior pubic rami fractures. 3. Degenerative changes in the right hip. 4. 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 Electronically signed by: Waddell Calk MD 06/12/2024 02:04 PM EDT RP Workstation: [id number]   CT HEAD WO CONTRAST Result Date: 06/12/2024 EXAM: CT HEAD WITHOUT CONTRAST 06/12/2024 12:24:12 PM TECHNIQUE: CT of the head was performed without the administration of intravenous contrast. Automated exposure control, iterative reconstruction, and/or weight based adjustment of the mA/kV was utilized to reduce the radiation dose to as low as reasonably achievable. COMPARISON: 07/13/2021 CLINICAL HISTORY: Neuro deficit, acute, stroke suspected. Pt presents with R side weakness, aphasia that started 8/4. She did have a fall at that time and has been having L hip pain. Pt saw her PCP yesterday and was started on ASA. Pt continued to have progressive  worsening of weakness and was having difficulty feeding herself last night. Pt is A/Ox4 during triage with slow slurred speech noted. FINDINGS: BRAIN AND VENTRICLES: No acute hemorrhage. Gray-white differentiation is preserved. No hydrocephalus. No extra-axial collection. No mass effect or midline shift. Nonspecific hypoattenuation in the periventricular and subcortical white matter, most likely representing chronic small vessel disease. Generalized parenchymal volume loss. ORBITS: No acute abnormality. SINUSES: No acute abnormality. SOFT TISSUES AND SKULL: No acute soft tissue abnormality. No skull fracture. VASCULATURE: Atherosclerosis in the carotid siphons. IMPRESSION: 1. No acute intracranial abnormality. 2. Nonspecific hypoattenuation in the periventricular and subcortical white matter, most likely representing chronic small vessel disease. Electronically signed by: Donnice Mania MD 06/12/2024 01:05 PM EDT RP Workstation: [id number]    EKG: Independently reviewed.  Sinus rhythm, no acute ST changes.  Assessment/Plan Principal Problem:   TIA (transient ischemic attack)  (please populate well all problems here in Problem List. (For example, if patient is on BP meds at home and you resume or decide to hold them, it is a problem that needs to be her. Same for CAD, COPD, HLD and so on)  Acute left paresis, resolved Acute aphasia, resolved Questionable acute dysphagia -Probably TIA - Brain MRI, CTA head and neck - Telemonitoring x 24 hours - PT OT and speech evaluation - Echocardiogram  HTN - Continue atenolol   Fall at home Acute on chronic ambulation impairment - No fracture or dislocation on left hip - PT OT evaluation  Chronic normocytic anemia - H&H stable, no symptoms or signs of active bleeding  Total of 55 minutes spent on patient care DVT prophylaxis: Lovenox  Code Status: Full code Family Communication: 2 nieces at bedside Disposition Plan: Expect less than 2 midnight  hospital stay Consults called: None Admission status: Telemetry observation  Cort ONEIDA Mana MD [facility] Pager [phone]  06/12/2024, 2:33 PM

## 2024-06-12 NOTE — ED Triage Notes (Addendum)
 Pt presents with R side weakness, aphasia that started 8/4. She did have a fall at that time and has been having L hip pain. Pt saw her PCP yesterday and was started on ASA. Pt continued to have progressive worsening of weakness and was having difficulty feeding herself last night. Pt is A/Ox4 during triage with slow slurred speech noted.   Pt's HPI was provided by her niece in triage.

## 2024-06-12 NOTE — Plan of Care (Signed)
  Problem: Education: Goal: Knowledge of disease or condition will improve Outcome: Progressing Goal: Knowledge of secondary prevention will improve (MUST DOCUMENT ALL) Outcome: Progressing Goal: Knowledge of patient specific risk factors will improve (DELETE if not current risk factor) Outcome: Progressing   Problem: Ischemic Stroke/TIA Tissue Perfusion: Goal: Complications of ischemic stroke/TIA will be minimized Outcome: Progressing   Problem: Health Behavior/Discharge Planning: Goal: Ability to manage health-related needs will improve Outcome: Progressing Goal: Goals will be collaboratively established with patient/family Outcome: Progressing   Problem: Self-Care: Goal: Ability to participate in self-care as condition permits will improve Outcome: Progressing Goal: Verbalization of feelings and concerns over difficulty with self-care will improve Outcome: Progressing Goal: Ability to communicate needs accurately will improve Outcome: Progressing   Problem: Nutrition: Goal: Risk of aspiration will decrease Outcome: Progressing Goal: Dietary intake will improve Outcome: Progressing   Problem: Health Behavior/Discharge Planning: Goal: Ability to manage health-related needs will improve Outcome: Progressing   Problem: Activity: Goal: Risk for activity intolerance will decrease Outcome: Progressing

## 2024-06-12 NOTE — Progress Notes (Signed)
 Received call from radiology about the CTA finding about the 75% narrowing on the right carotid bifurcation site with signs of distal intramural thrombosis formation.  D/W oncall neurology Dr. Merrianne who recommended carotid doppler to confirm CTA finding and permissive HTN. Neuro will see patient in AM to decide next step.

## 2024-06-12 NOTE — Progress Notes (Signed)
 PT Cancellation Note  Patient Details Name: Lori Wallace MRN: [id number] DOB: [birth year]   Cancelled Treatment:    Reason Eval/Treat Not Completed: Patient at procedure or test/unavailable Pt was out of room for MRI, will attempt to eval pt tomorrow.  [name] 06/12/2024, 4:14 PM

## 2024-06-12 NOTE — ED Provider Notes (Signed)
 [facility] Provider Note    Event Date/Time   First MD Initiated Contact with Patient 06/12/24 1154     (approximate)   History   Chief Complaint: Weakness and Aphasia   HPI  Lori Wallace is a 88 y.o. female with a history of hypertension who comes to the ED due to left-sided weakness and difficulty with speech that started 2 days ago.  Had a fall at that time.  Saw PCP yesterday, symptoms had improved.  Overnight they were worse again with difficulty walking.  Patient denies any pain currently.  Denies vision changes.  No dysuria but has had urinary frequency.  Niece is at bedside no poor short-term memory as well.        Past Medical History:  Diagnosis Date   Actinic keratosis    Arthritis    Breast cancer (HCC) [phone]   Cancer [facility])    breast s/p bilateral lumpectomies with radiation,   Complication of anesthesia    Pt stated her top lip was puffed up and felt strange for weeks after having hip surgery. Pt thinks intubation appliance may have caused it   Constipation due to pain medication    Environmental and seasonal allergies    Frequency of urination    Hx of basal cell carcinoma 08/19/2010   Mid dorsum nose supra tip   Hypertension    pt has not had any blood pressure medications in over 4 years   Malignant neoplasm of upper-outer quadrant of female breast (HCC) 1995   [place]  wide excision, reexcision, whole breast radiation.   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 T1a   Malignant neoplasm of upper-outer quadrant of female breast (HCC) 2002   Right breast   Squamous cell carcinoma of skin 11/23/2007   Right lateral upper eyelid. SCCis   Squamous cell carcinoma of skin 09/03/2019   Right lateral pretibia. EDC.    Squamous cell carcinoma of skin 03/30/2020   R lateral pretibia    Squamous cell carcinoma of  skin 03/09/2022   R upper arm, EDC   Trigger finger of both hands     Current Outpatient Rx   Order #: [id number] Class: Historical Med   Order #: [id number] Class: Historical Med   Order #: [id number] Class: Historical Med   Order #: [id number] Class: Normal   Order #: [id number] Class: Historical Med   Order #: [id number] Class: Historical Med   Order #: [id number] Class: Normal   Order #: [id number] Class: Normal   Order #: [id number] Class: Normal   Order #: [id number] Class: Print   Order #: [id number] Class: Historical Med   Order #: [id number] Class: Historical Med   Order #: [id number] Class: Historical Med   Order #: [id number] Class: Historical Med    Past Surgical History:  Procedure Laterality Date   BACK SURGERY  1960s   lumbar ruptured discs   BREAST BIOPSY     BREAST LUMPECTOMY Left 1995 with Radiation   BREAST LUMPECTOMY Right 2002   with Radiation   BREAST LUMPECTOMY Left 2011   BREAST SURGERY Bilateral    lumpectomies x 3   COLONOSCOPY  2006   Dr. Viktoria   EYE SURGERY  1990s   bilat cataract removal   FRACTURE SURGERY     L wrist repair, R lower leg   JOINT REPLACEMENT     R knee w/prior arhtroscopy   KYPHOPLASTY N/A 10/23/2014   Procedure: KYPHOPLASTY;  Surgeon: Oneil Rodgers Priestly, MD;  Location: [facility] OR;  Service: Orthopedics;  Laterality: N/A;  T 12 kyphoplasty   TOTAL HIP ARTHROPLASTY  07/30/2012   Procedure: TOTAL HIP ARTHROPLASTY;  Surgeon: Dempsey JINNY Sensor, MD;  Location: [facility] OR;  Service: Orthopedics;  Laterality: Left;   uterine tumor excision     benign   WRIST SURGERY Left     Physical Exam   Triage Vital Signs: ED Triage Vitals  Encounter Vitals Group     BP 06/12/24 1137 (!) 161/90     Girls Systolic BP Percentile --      Girls Diastolic BP Percentile --      Boys Systolic BP Percentile --      Boys Diastolic BP Percentile --      Pulse Rate 06/12/24 1137 61     Resp 06/12/24 1137 20     Temp 06/12/24 1137 98.6 F (37 C)     Temp Source 06/12/24 1137 Oral      SpO2 06/12/24 1137 99 %     Weight 06/12/24 1132 160 lb (72.6 kg)     Height 06/12/24 1132 5' 10 (1.778 m)     Head Circumference --      Peak Flow --      Pain Score 06/12/24 1131 0     Pain Loc --      Pain Education --      Exclude from Growth Chart --     Most recent vital signs: Vitals:   06/12/24 1137  BP: (!) 161/90  Pulse: 61  Resp: 20  Temp: 98.6 F (37 C)  SpO2: 99%    General: Awake, no distress.  CV:  Good peripheral perfusion.  Regular rate rhythm Resp:  Normal effort.  Clear to auscultation Abd:  No distention.  Soft nontender Other:  Mild tenderness at left posterior hip.  Full range of motion, no limb shortening.  Other joints and long bones are all stable and nontender.   ED Results / Procedures / Treatments   Labs (all labs ordered are listed, but only abnormal results are displayed) Labs Reviewed  CBC - Abnormal; Notable for the following components:      Result Value   RBC 3.83 (*)    Hemoglobin 11.9 (*)    All other components within normal limits  DIFFERENTIAL - Abnormal; Notable for the following components:   Eosinophils Absolute 0.6 (*)    All other components within normal limits  COMPREHENSIVE METABOLIC PANEL WITH GFR - Abnormal; Notable for the following components:   BUN 25 (*)    Creatinine, Ser 1.06 (*)    GFR, Estimated 51 (*)    All other components within normal limits  URINALYSIS, W/ REFLEX TO CULTURE (INFECTION SUSPECTED) - Abnormal; Notable for the following components:   Color, Urine STRAW (*)    APPearance CLEAR (*)    Specific Gravity, Urine 1.004 (*)    Bacteria, UA RARE (*)    All other components within normal limits  CBG MONITORING, ED - Abnormal; Notable for the following components:   Glucose-Capillary 102 (*)    All other components within normal limits  PROTIME-INR  APTT  ETHANOL     EKG Interpreted by me Sinus rhythm rate of 60.  Normal axis, normal intervals.  Poor R wave progression.  No acute  ischemic changes.   RADIOLOGY CT head interpreted by me, unremarkable.  Radiology report reviewed   PROCEDURES:  Procedures   MEDICATIONS ORDERED IN ED:  Medications  sodium chloride  flush (NS) 0.9 % injection 3 mL (has no administration in time range)     IMPRESSION / MDM / ASSESSMENT AND PLAN / ED COURSE  I reviewed the triage vital signs and the nursing notes.  DDx: Dehydration, AKI, electrolyte derangement, anemia, UTI, intracranial hemorrhage, stroke/TIA  Patient's presentation is most consistent with acute presentation with potential threat to life or bodily function.  Patient presents with stroke/TIA symptoms, now improved.  Doubt meningitis or encephalitis or sepsis.  CT head, x-ray left hip, labs all unremarkable.  Patient and family are agreeable to hospitalization for further TIA workup.   ----------------------------------------- 1:43 PM on 06/12/2024 ----------------------------------------- Case discussed with hospitalist     FINAL CLINICAL IMPRESSION(S) / ED DIAGNOSES   Final diagnoses:  TIA (transient ischemic attack)     Rx / DC Orders   ED Discharge Orders     None        Note:  This document was prepared using Dragon voice recognition software and may include unintentional dictation errors.   Viviann Pastor, MD 06/12/24 [phone]

## 2024-06-12 NOTE — Progress Notes (Signed)
 Patient received from procedure via bed in stable condition.

## 2024-06-13 ENCOUNTER — Observation Stay

## 2024-06-13 DIAGNOSIS — Z885 Allergy status to narcotic agent status: Secondary | ICD-10-CM | POA: Diagnosis not present

## 2024-06-13 DIAGNOSIS — Z888 Allergy status to other drugs, medicaments and biological substances status: Secondary | ICD-10-CM | POA: Diagnosis not present

## 2024-06-13 DIAGNOSIS — F03918 Unspecified dementia, unspecified severity, with other behavioral disturbance: Secondary | ICD-10-CM | POA: Diagnosis present

## 2024-06-13 DIAGNOSIS — F32A Depression, unspecified: Secondary | ICD-10-CM | POA: Diagnosis present

## 2024-06-13 DIAGNOSIS — Z7902 Long term (current) use of antithrombotics/antiplatelets: Secondary | ICD-10-CM | POA: Diagnosis not present

## 2024-06-13 DIAGNOSIS — I739 Peripheral vascular disease, unspecified: Secondary | ICD-10-CM

## 2024-06-13 DIAGNOSIS — G8194 Hemiplegia, unspecified affecting left nondominant side: Secondary | ICD-10-CM | POA: Diagnosis present

## 2024-06-13 DIAGNOSIS — Z7982 Long term (current) use of aspirin: Secondary | ICD-10-CM | POA: Diagnosis not present

## 2024-06-13 DIAGNOSIS — I6521 Occlusion and stenosis of right carotid artery: Secondary | ICD-10-CM | POA: Diagnosis present

## 2024-06-13 DIAGNOSIS — F0393 Unspecified dementia, unspecified severity, with mood disturbance: Secondary | ICD-10-CM | POA: Diagnosis present

## 2024-06-13 DIAGNOSIS — I63231 Cerebral infarction due to unspecified occlusion or stenosis of right carotid arteries: Secondary | ICD-10-CM

## 2024-06-13 DIAGNOSIS — I6389 Other cerebral infarction: Secondary | ICD-10-CM | POA: Diagnosis present

## 2024-06-13 DIAGNOSIS — Z87891 Personal history of nicotine dependence: Secondary | ICD-10-CM | POA: Diagnosis not present

## 2024-06-13 DIAGNOSIS — Z79899 Other long term (current) drug therapy: Secondary | ICD-10-CM | POA: Diagnosis not present

## 2024-06-13 DIAGNOSIS — I639 Cerebral infarction, unspecified: Secondary | ICD-10-CM | POA: Diagnosis not present

## 2024-06-13 DIAGNOSIS — G459 Transient cerebral ischemic attack, unspecified: Secondary | ICD-10-CM | POA: Diagnosis not present

## 2024-06-13 DIAGNOSIS — Z96642 Presence of left artificial hip joint: Secondary | ICD-10-CM | POA: Diagnosis present

## 2024-06-13 DIAGNOSIS — K5900 Constipation, unspecified: Secondary | ICD-10-CM | POA: Diagnosis present

## 2024-06-13 DIAGNOSIS — Z85828 Personal history of other malignant neoplasm of skin: Secondary | ICD-10-CM | POA: Diagnosis not present

## 2024-06-13 DIAGNOSIS — Z91010 Allergy to peanuts: Secondary | ICD-10-CM | POA: Diagnosis not present

## 2024-06-13 DIAGNOSIS — R296 Repeated falls: Secondary | ICD-10-CM

## 2024-06-13 DIAGNOSIS — W06XXXA Fall from bed, initial encounter: Secondary | ICD-10-CM | POA: Diagnosis present

## 2024-06-13 DIAGNOSIS — F05 Delirium due to known physiological condition: Secondary | ICD-10-CM | POA: Diagnosis not present

## 2024-06-13 DIAGNOSIS — R531 Weakness: Secondary | ICD-10-CM | POA: Diagnosis present

## 2024-06-13 DIAGNOSIS — Z823 Family history of stroke: Secondary | ICD-10-CM | POA: Diagnosis not present

## 2024-06-13 DIAGNOSIS — I10 Essential (primary) hypertension: Secondary | ICD-10-CM | POA: Diagnosis present

## 2024-06-13 DIAGNOSIS — Z66 Do not resuscitate: Secondary | ICD-10-CM | POA: Diagnosis present

## 2024-06-13 DIAGNOSIS — G4700 Insomnia, unspecified: Secondary | ICD-10-CM | POA: Diagnosis present

## 2024-06-13 DIAGNOSIS — R4701 Aphasia: Secondary | ICD-10-CM | POA: Diagnosis present

## 2024-06-13 DIAGNOSIS — D649 Anemia, unspecified: Secondary | ICD-10-CM | POA: Diagnosis present

## 2024-06-13 DIAGNOSIS — Z853 Personal history of malignant neoplasm of breast: Secondary | ICD-10-CM | POA: Diagnosis not present

## 2024-06-13 DIAGNOSIS — Y92003 Bedroom of unspecified non-institutional (private) residence as the place of occurrence of the external cause: Secondary | ICD-10-CM | POA: Diagnosis not present

## 2024-06-13 HISTORY — DX: Cerebral infarction, unspecified: I63.9

## 2024-06-13 LAB — LIPID PANEL
Cholesterol: 208 mg/dL — ABNORMAL HIGH (ref 0–200)
HDL: 77 mg/dL (ref >40)
LDL Cholesterol: 109 mg/dL — ABNORMAL HIGH (ref 0–99)
Total CHOL/HDL Ratio: 2.7 ratio
Triglycerides: 109 mg/dL (ref <150)
VLDL: 22 mg/dL (ref 0–40)

## 2024-06-13 LAB — HEMOGLOBIN A1C
Hgb A1c MFr Bld: 5.8 % — ABNORMAL HIGH (ref 4.8–5.6)
Mean Plasma Glucose: 120 mg/dL

## 2024-06-13 LAB — ECHOCARDIOGRAM COMPLETE
Area-P 1/2: 2.48 cm2
Height: 70 in
S' Lateral: 2.2 cm
Weight: 2560 [oz_av]

## 2024-06-13 LAB — CK: Total CK: 115 U/L (ref 38–234)

## 2024-06-13 MED ORDER — ASPIRIN 81 MG PO TBEC
81.0000 mg | DELAYED_RELEASE_TABLET | Freq: Every day | ORAL | Status: DC
  Administered 2024-06-13 – 2024-06-20 (×11): 81 mg via ORAL
  Filled 2024-06-13 (×8): qty 1

## 2024-06-13 NOTE — Consult Note (Signed)
 NEUROLOGY CONSULT NOTE   Date of service: June 13, 2024 Patient Name: LUVINIA LUCY MRN:  [id number] DOB:  05/15/1936 Chief Complaint: Left sided weakness Requesting Provider: Marsa Edelman, DO  History of Present Illness  [place] is a 88 y.o. female with a PMHx of breast cancer, arthritis, HTN, squamous cell carcinoma of skin at multiple locations, actinic keratosis and prior low back surgery, kyphoplasty and total left hip replacement who presented to the ED yesterday with new onset of severe weakness, worse on the left, in conjunction with difficulty with speech that had started 2 days previously. Her weakness had been associated with two falls without striking her head, but she did hit her left hip with one of the falls. Symptoms transiently improved at the time of her PCP appointment, at which ASA was started. Her symptoms then worsened again with difficulty walking.  Family also noticed the patient shoveling food in her mouth, but not swallowing. She had no vision changes. Of note, she has had recent onset of memory problems, per family. CTA showed a 75% narrowing of the right carotid bifurcation with distal intraluminal thrombosis; left ICA atherosclerotic narrowing was also noted. At the time of Neurology evaluation, she continues to complain of left hip pain.     ROS  Comprehensive ROS performed and pertinent positives documented in HPI   Past History   Past Medical History:  Diagnosis Date   Actinic keratosis    Arthritis    Breast cancer (HCC) [phone]   Cancer [facility])    breast s/p bilateral lumpectomies with radiation,   Complication of anesthesia    Pt stated her top lip was puffed up and felt strange for weeks after having hip surgery. Pt thinks intubation appliance may have caused it   Constipation due to pain medication    Environmental and seasonal allergies    Frequency of urination    Hx of basal cell carcinoma 08/19/2010   Mid dorsum nose supra  tip   Hypertension    pt has not had any blood pressure medications in over 4 years   Malignant neoplasm of upper-outer quadrant of female breast (HCC) 1995   [place]  wide excision, reexcision, whole breast radiation.   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 T1a   Malignant neoplasm of upper-outer quadrant of female breast (HCC) 2002   Right breast   Squamous cell carcinoma of skin 11/23/2007   Right lateral upper eyelid. SCCis   Squamous cell carcinoma of skin 09/03/2019   Right lateral pretibia. [facility].    Squamous cell carcinoma of skin 03/30/2020   R lateral pretibia    Squamous cell carcinoma of skin 03/09/2022   R upper arm, [facility]   Trigger finger of both hands     Past Surgical History:  Procedure Laterality Date   BACK SURGERY  1960s   lumbar ruptured discs   BREAST BIOPSY     BREAST LUMPECTOMY Left 1995 with Radiation   BREAST LUMPECTOMY Right 2002   with Radiation   BREAST LUMPECTOMY Left 2011   BREAST SURGERY Bilateral    lumpectomies x 3   COLONOSCOPY  2006   Dr. Viktoria   EYE SURGERY  1990s   bilat cataract removal   FRACTURE SURGERY     L wrist repair, R lower leg   JOINT REPLACEMENT     R knee w/prior arhtroscopy   KYPHOPLASTY  N/A 10/23/2014   Procedure: KYPHOPLASTY;  Surgeon: Oneil Rodgers Priestly, MD;  Location: [facility] OR;  Service: Orthopedics;  Laterality: N/A;  T 12 kyphoplasty   TOTAL HIP ARTHROPLASTY  07/30/2012   Procedure: TOTAL HIP ARTHROPLASTY;  Surgeon: Dempsey JINNY Sensor, MD;  Location: [facility] OR;  Service: Orthopedics;  Laterality: Left;   uterine tumor excision     benign   WRIST SURGERY Left     Family History: Family History  Problem Relation Age of Onset   Cancer Father    Cancer Sister    Stroke Sister    Breast cancer Neg Hx     Social History  reports that she has quit smoking. She has never used smokeless tobacco. She reports  that she does not drink alcohol  and does not use drugs.  Allergies  Allergen Reactions   Carrot [Daucus Carota] Itching    States theres a list of 15 foods that make her itch, but she eats anyway   Demerol [Meperidine] Nausea And Vomiting   Peanut-Containing Drug Products Itching   Propoxyphene Nausea And Vomiting   Tape Itching    Medications   Current Facility-Administered Medications:    acetaminophen  (TYLENOL ) tablet 650 mg, 650 mg, Oral, Q4H PRN, 650 mg at 06/12/24 1723 **OR** acetaminophen  (TYLENOL ) 160 MG/5ML solution 650 mg, 650 mg, Per Tube, Q4H PRN **OR** acetaminophen  (TYLENOL ) suppository 650 mg, 650 mg, Rectal, Q4H PRN, Laurita Manor T, MD   aspirin  EC tablet 81 mg, 81 mg, Oral, Daily, Alexander, Natalie, DO, 81 mg at 06/13/24 9167   docusate sodium  (COLACE) capsule 200 mg, 200 mg, Oral, BID, Zhang, Ping T, MD, 200 mg at 06/13/24 9167   enoxaparin  (LOVENOX ) injection 40 mg, 40 mg, Subcutaneous, Q24H, Zhang, Ping T, MD, 40 mg at 06/12/24 2038   feeding supplement (ENSURE PLUS HIGH PROTEIN) liquid 237 mL, 237 mL, Oral, BID BM, Laurita, Ping T, MD, 237 mL at 06/13/24 9167   hydrALAZINE  (APRESOLINE ) injection 5 mg, 5 mg, Intravenous, Q6H PRN, Laurita Manor T, MD   loratadine  (CLARITIN ) tablet 10 mg, 10 mg, Oral, Daily PRN, Zhang, Ping T, MD   LORazepam  (ATIVAN ) tablet 0.5 mg, 0.5 mg, Oral, Q6H PRN, Laurita Manor T, MD, 0.5 mg at 06/12/24 1549   morphine  (PF) 2 MG/ML injection 2 mg, 2 mg, Intravenous, Q4H PRN, Mansy, Jan A, MD, 2 mg at 06/13/24 9441   OLANZapine  (ZYPREXA ) injection 5 mg, 5 mg, Intramuscular, Q6H PRN, Laurita Manor T, MD, 5 mg at 06/12/24 2039   senna-docusate (Senokot-S) tablet 1 tablet, 1 tablet, Oral, QHS PRN, Laurita Manor T, MD   sodium chloride  flush (NS) 0.9 % injection 3 mL, 3 mL, Intravenous, Once, Viviann Pastor, MD   ziprasidone  (GEODON ) injection 10 mg, 10 mg, Intramuscular, Q6H PRN, Mansy, Jan A, MD, 10 mg at 06/12/24 2204  No current facility-administered  medications on file prior to encounter.   Current Outpatient Medications on File Prior to Encounter  Medication Sig Dispense Refill   atenolol  (TENORMIN ) 25 MG tablet Take 25 mg by mouth daily.     buPROPion (WELLBUTRIN XL) 150 MG 24 hr tablet Take 150 mg by mouth daily.     Calcium -Magnesium -Vitamin D  (CALCIUM  MAGNESIUM  PO) Take 1 tablet by mouth daily.     cholecalciferol  (VITAMIN D ) 1000 UNITS tablet Take 1,000 Units by mouth daily.     ibuprofen  (ADVIL ) 200 MG tablet Take 200 mg by mouth as needed.     loratadine  (CLARITIN ) 10 MG tablet Take 10 mg by  mouth daily as needed for allergies.     mirtazapine (REMERON) 15 MG tablet Take 15 mg by mouth at bedtime.     Polyvinyl Alcohol -Povidone (REFRESH OP) Place 1 drop into both eyes as needed (dry eyes).      sodium chloride  (OCEAN) 0.65 % SOLN nasal spray Place 1 spray into both nostrils as needed for congestion.     Zinc  50 MG TABS Take 50 mg by mouth daily.     Probiotic Product (PROBIOTIC DAILY PO) Take 1 tablet by mouth daily. (Patient not taking: Reported on 04/25/2021)       Vitals   Vitals:   Jul 05, 2024 2357 06/13/24 0359 06/13/24 0553 06/13/24 0731  BP: (!) 166/74 (!) 172/83 (!) 170/73 (!) 167/73  Pulse: (!) 59 (!) 56 (!) 58 (!) 54  Resp:  16  16  Temp: (!) 97.4 F (36.3 C) (!) 94.4 F (34.7 C) 97.9 F (36.6 C) 97.7 F (36.5 C)  TempSrc: Oral Oral  Oral  SpO2: 98% 99% 99% 100%  Weight:      Height:        Body mass index is 22.96 kg/m.   Physical Exam   Constitutional: Frail-appearing elderly female Psych: Dysthymic and fatigued affect  Eyes: No scleral injection.  HENT: No OP obstruction. No neck stiffness. Tongue/oral mucosa is somewhat dry Head: Normocephalic.  Respiratory: Effort normal, non-labored breathing.    Neurologic Examination   Mental Status: Awake and alert, but appears fatigued. Fully oriented to her name, the city, state, year, month, day and situation. Able to provide details regarding her  current situation. Speech is fluent with intact naming and comprehension, but is relatively sparse, with short replies to all questions. Mild dysarthria is noted in the context of dry oral mucosa.  Cranial Nerves: II: Temporal visual fields intact bilaterally. PERRL. III,IV, VI: No ptosis. EOMI. No nystagmus. V: Temp sensation equal bilaterally VII: Smile symmetric, but with questionably decreased prominence of right NL fold VIII: HOH IX,X: Mild nasal quality to her speech XI: Symmetric XII: Midline tongue extension Motor: RUE: 5/5 except for deltoid/shoulder abduction, which is 4-/5 LUE: 5/5 proximally and distally  RLE: 5/5 LLE: 5/5 knee extension, ADF and APF, but with weak hip flexion and knee flexion in the context of left hip pain Sensory: Temp and FT intact x 4. No extinction to DSS. Deep Tendon Reflexes: 2+ and symmetric bilateral biceps, brachioradialis and patellae Cerebellar: No ataxia with FNF bilaterally, but with mild intention tremor.  Gait: Deferred   Labs/Imaging/Neurodiagnostic studies   CBC:  Recent Labs  Lab July 05, 2024 1136  WBC 9.3  NEUTROABS 5.5  HGB 11.9*  HCT 37.0  MCV 96.6  PLT 265   Basic Metabolic Panel:  Lab Results  Component Value Date   NA 139 07-05-2024   K 4.0 Jul 05, 2024   CO2 27 Jul 05, 2024   GLUCOSE 94 2024/07/05   BUN 25 (H) 05-Jul-2024   CREATININE 1.06 (H) 2024-07-05   CALCIUM  9.2 Jul 05, 2024   GFRNONAA 51 (L) 07-05-24   GFRAA >90 10/21/2014   Lipid Panel:  Lab Results  Component Value Date   LDLCALC 109 (H) 06/13/2024   HgbA1c:  Lab Results  Component Value Date   HGBA1C 5.8 (H) 2024/07/05   Urine Drug Screen: No results found for: LABOPIA, COCAINSCRNUR, LABBENZ, AMPHETMU, THCU, LABBARB  Alcohol  Level     Component Value Date/Time   [facility] <15 07-05-2024 1136   INR  Lab Results  Component Value Date   INR 1.0 Jul 05, 2024  APTT  Lab Results  Component Value Date   APTT 28 06/12/2024   TTE: 1. Left  ventricular ejection fraction, by estimation, is 60 to 65%. The  left ventricle has normal function. The left ventricle has no regional  wall motion abnormalities. Left ventricular diastolic parameters are  consistent with Grade I diastolic  dysfunction (impaired relaxation).   2. Right ventricular systolic function is normal. The right ventricular  size is normal. Tricuspid regurgitation signal is inadequate for assessing  PA pressure.   3. The mitral valve is normal in structure. No evidence of mitral valve  regurgitation. No evidence of mitral stenosis.   4. The aortic valve is normal in structure. Aortic valve regurgitation is  not visualized. Aortic valve sclerosis/calcification is present, without  any evidence of aortic stenosis.   5. The inferior vena cava is normal in size with greater than 50%  respiratory variability, suggesting right atrial pressure of 3 mmHg.   ASSESSMENT  88 y.o. female with a PMHx of breast cancer, arthritis, HTN, squamous cell carcinoma of skin at multiple locations, actinic keratosis and prior low back surgery, kyphoplasty and total left hip replacement who presented to the ED yesterday with new onset of severe weakness, worse on the left, in conjunction with difficulty with speech that had started 2 days previously. Her weakness had been associated with two falls without striking her head, but she did hit her left hip with one of the falls. Symptoms transiently improved at the time of her PCP appointment, at which ASA was started. Her symptoms then worsened again with difficulty walking.  Family also noticed the patient shoveling food in her mouth, but not swallowing. She had no vision changes. Of note, she has had recent onset of memory problems, per family. CTA showed a 75% narrowing of the right carotid bifurcation with distal intraluminal thrombosis; left ICA atherosclerotic narrowing was also noted. At the time of Neurology evaluation, she continues to  complain of left hip pain.  - Exam reveals a fatigued, elderly and frail-appearing female with right deltoid weakness and proximal LLE weakness in the context of left hip pain.  - Imaging:  - CT head: No acute intracranial abnormality. Nonspecific hypoattenuation in the periventricular and subcortical white matter, most likely representing chronic small vessel disease.  - CTA of head and neck: Mixed atherosclerotic plaque at the right carotid bifurcation resulting in approximately 75% stenosis of the right cervical ICA origin. Focal intraluminal soft tissue in the proximal right cervical ICA concerning for intraluminal thrombus with approximately 50% luminal narrowing. Approximately 60% stenosis of the origin of the left cervical ICA due to calcified atherosclerosis. Additional atherosclerosis as discussed in the body of the report. Findings suggestive of vertebrobasilar hypoplasia. - MRI brain: Patient unable to complete exam due to claustrophobia. Limited DWI and ADC images obtained. Motion artifact. Within these limitations, acute infarcts noted within the right frontal lobe involving the corona radiata and periventricular white matter. - TTE: No mural thrombus or valvular vegetation mentioned in the report. LVEF 60-65%. - Carotid ultrasound: Atherosclerotic plaque involving bilateral carotid arteries. Estimated degree of stenosis in the right internal carotid artery is greater than 70%. Estimated degree of stenosis at the left carotid bulb and proximal left internal carotid artery is 50-69%. Stenosis involving bilateral external carotid arteries. Patent vertebral arteries bilaterally. - Pelvic and left hip X-ray: No acute fracture or dislocation of the left hip. Chronic left superior and inferior pubic rami fractures. Degenerative changes in the right hip. Ankylosis of  the lower spine and sacrum, similar to the previous exam. - Elevated total cholesterol and LDL. WBC normal. HgbA1c mildly elevated at  5.8. Glucose 94. Coags normal. Elevated BUN and Cr with eGFR of 51.  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   RECOMMENDATIONS  - Vascular Surgery consult - IV hydration.  - Permissive HTN x 24 hours with SBP to be treated if > 220.  - Continue ASA.  - Hold off on addition of Plavix as she may have CEA here, pending Vascular Surgery opinion - HgbA1c, fasting lipid panel - PT consult, OT consult, Speech consult - Given her advanced age and frailty, would hold off on a statin for now.  - CK level - Risk factor modification - Telemetry monitoring - Frequent neuro checks - NPO until passes stroke swallow screen   ______________________________________________________________________    Bonney SHARK, Alexanderia Gorby, MD [facility]

## 2024-06-13 NOTE — Progress Notes (Signed)
 PROGRESS NOTE    [name]   FMW:[id number] DOB: 08-Jul-1936  DOA: 06/12/2024 Date of Service: 06/13/24 which is hospital day 0  PCP: [name], MD    Hospital course / significant events:   HPI: Lori Wallace is a 88 y.o. female with medical history significant of HTN, skin cancer, breast cancer status post bilateral lumpectomy, presented with strokelike symptoms, including recent L sided weakness, fall w/o LOC, slurred speech. Went to see PCP, suspected TIA, startes on ASA. Pt continues to have speech difficulty and swallowing difficulty, memory problems. Family brought pt to ED 08/06 08/06: hypertensive, bradycardic. Unremarkable CBC, CMP, UA. Admitted to hospitalist for TIA/CVA w/u. Imaging: CTA H/N (+)75% narrowing on the right carotid bifurcation site with signs of distal intramural thrombosis formation. Admitting hospitalist D/W oncall neurology Dr. Lindzen - he recommended carotid doppler to confirm CTA finding and permissive HTN, will see patient in AM to decide next step. MRI brain unable to complete d/t claustrophobia and motion artifact but despite these limitations MRI did reveal acute infarcts noted within the right frontal lobe involving the corona radiata and periventricular white matter 08/07: Carotid US  today, confirmed stenosis, no mention of thrombus. Vascular surgery consult.      Consultants:  Neurology  Vascular surgery   Procedures/Surgeries: none      ASSESSMENT & PLAN:   Acute CVA  Acute left paresis, resolved Acute aphasia, resolved Acute dysphagia ASA, statin  Permissive HTN PT OT and speech evaluation Neurology recs: consult to vascular re carotid stenosis Echocardiogram Telemetry    CTA H/N clinically significant findings of 75% narrowing on the right carotid bifurcation site with signs of distal intramural thrombosis formation.  ASA Consult to vascular surgery re carotid stenosis  HTN Hold atenolol  for permissive HTN, also  bradycardic    Bradycardia Likely beta blocker effect Hold atenolol   Fall at home Acute on chronic ambulation impairment No fracture or dislocation on left hip PT OT evaluation   Chronic normocytic anemia H&H stable, no symptoms or signs of active bleeding Monitor CBC  Mental health  Holding buproprion  Restart mirtazapine given insomnia   No concerns based on BMI: Body mass index is 22.96 kg/m.[name] Significantly low or high BMI is associated with higher medical risk.  Underweight - under 18  overweight - 25 to 29 obese - 30 or more Class 1 obesity: BMI of 30.0 to 34 Class 2 obesity: BMI of 35.0 to 39 Class 3 obesity: BMI of 40.0 to 49 Super Morbid Obesity: BMI 50-59 Super-super Morbid Obesity: BMI 60+ Healthy nutrition and physical activity advised as adjunct to other disease management and risk reduction treatments    DVT prophylaxis: lovenox  IV fluids: holding continuous IV fluids  Nutrition: per SLP Central lines / other devices: none  Code Status: FULL CODE ACP documentation reviewed: none on file in [facility]  TOC needs: SNF rehab placement Medical barriers to dispo: pending vascular surgery eval. Expected medical readiness for discharge pending their recs / SNF placement.              Subjective / Brief ROS:  Patient reports feeling tired today Denies CP/SOB.  Pain controlled.  Denies new weakness.  Tolerating diet.  Reports no concerns w/ urination/defecation.   Family Communication: daughter at bedside on rounds     Objective Findings:  Vitals:   06/13/24 0553 06/13/24 0731 06/13/24 1200 06/13/24 1641  BP: (!) 170/73 (!) 167/73 (!) 150/80 128/64  Pulse: (!) 58 (!) 54 60 70  Resp:  16  16  Temp: 97.9 F (36.6 C) 97.7 F (36.5 C) 98 F (36.7 C) (!) 97.5 F (36.4 C)  TempSrc:  Oral Oral Oral  SpO2: 99% 100% 99% 98%  Weight:      Height:        Intake/Output Summary (Last 24 hours) at 06/13/2024 1709 Last data filed at 06/13/2024  1300 Gross per 24 hour  Intake 789.24 ml  Output --  Net 789.24 ml   Filed Weights   06/12/24 1132  Weight: 72.6 kg    Examination:  Physical Exam Constitutional:      General: She is not in acute distress. Cardiovascular:     Rate and Rhythm: Regular rhythm. Bradycardia present.  Pulmonary:     Effort: Pulmonary effort is normal.     Breath sounds: Normal breath sounds.  Neurological:     Mental Status: She is alert.  Psychiatric:        Mood and Affect: Mood normal.        Behavior: Behavior normal.          Scheduled Medications:   aspirin  EC  81 mg Oral Daily   docusate sodium   200 mg Oral BID   enoxaparin  (LOVENOX ) injection  40 mg Subcutaneous Q24H   feeding supplement  237 mL Oral BID BM   sodium chloride  flush  3 mL Intravenous Once    Continuous Infusions:   PRN Medications:  acetaminophen  **OR** acetaminophen  (TYLENOL ) oral liquid 160 mg/5 mL **OR** acetaminophen , hydrALAZINE , loratadine , LORazepam , morphine  injection, OLANZapine , senna-docusate, ziprasidone   Antimicrobials from admission:  Anti-infectives (From admission, onward)    None           Data Reviewed:  I have personally reviewed the following...  CBC: Recent Labs  Lab 06/12/24 1136  WBC 9.3  NEUTROABS 5.5  HGB 11.9*  HCT 37.0  MCV 96.6  PLT 265   Basic Metabolic Panel: Recent Labs  Lab 06/12/24 1136  NA 139  K 4.0  CL 104  CO2 27  GLUCOSE 94  BUN 25*  CREATININE 1.06*  CALCIUM  9.2   GFR: Estimated Creatinine Clearance: 42 mL/min (A) (by C-G formula based on SCr of 1.06 mg/dL (H)). Liver Function Tests: Recent Labs  Lab 06/12/24 1136  AST 20  ALT 15  ALKPHOS 63  BILITOT 1.0  PROT 7.7  ALBUMIN 3.7   No results for input(s): LIPASE, AMYLASE in the last 168 hours. No results for input(s): AMMONIA in the last 168 hours. Coagulation Profile: Recent Labs  Lab 06/12/24 1136  INR 1.0   Cardiac Enzymes: No results for input(s): CKTOTAL,  CKMB, CKMBINDEX, TROPONINI in the last 168 hours. BNP (last 3 results) No results for input(s): PROBNP in the last 8760 hours. HbA1C: Recent Labs    06/12/24 1136  HGBA1C 5.8*   CBG: Recent Labs  Lab 06/12/24 1138  GLUCAP 102*   Lipid Profile: Recent Labs    06/13/24 0336  CHOL 208*  HDL 77  LDLCALC 109*  TRIG 109  CHOLHDL 2.7   Thyroid Function Tests: No results for input(s): TSH, T4TOTAL, FREET4, T3FREE, THYROIDAB in the last 72 hours. Anemia Panel: No results for input(s): VITAMINB12, FOLATE, FERRITIN, TIBC, IRON, RETICCTPCT in the last 72 hours. Most Recent Urinalysis On File:     Component Value Date/Time   COLORURINE STRAW (A) 06/12/2024 1206   APPEARANCEUR CLEAR (A) 06/12/2024 1206   APPEARANCEUR Clear 09/24/2014 1532   LABSPEC 1.004 (L) 06/12/2024 1206   LABSPEC  1.004 09/24/2014 1532   PHURINE 6.0 06/12/2024 1206   GLUCOSEU NEGATIVE 06/12/2024 1206   GLUCOSEU Negative 09/24/2014 1532   HGBUR NEGATIVE 06/12/2024 1206   BILIRUBINUR NEGATIVE 06/12/2024 1206   BILIRUBINUR Negative 09/24/2014 1532   KETONESUR NEGATIVE 06/12/2024 1206   PROTEINUR NEGATIVE 06/12/2024 1206   UROBILINOGEN 0.2 10/21/2014 1259   NITRITE NEGATIVE 06/12/2024 1206   LEUKOCYTESUR NEGATIVE 06/12/2024 1206   LEUKOCYTESUR Negative 09/24/2014 1532   Sepsis Labs: @LABRCNTIP (procalcitonin:4,lacticidven:4) Microbiology: No results found for this or any previous visit (from the past 240 hours).    Radiology Studies last 3 days: US  Carotid Bilateral Result Date: 06/13/2024 CLINICAL DATA:  Thrombosis. EXAM: BILATERAL CAROTID DUPLEX ULTRASOUND TECHNIQUE: Elnor scale imaging, color Doppler and duplex ultrasound were performed of bilateral carotid and vertebral arteries in the neck. COMPARISON:  Neck CTA 06/12/2024 FINDINGS: Criteria: Quantification of carotid stenosis is based on velocity parameters that correlate the residual internal carotid diameter with  NASCET-based stenosis levels, using the diameter of the distal internal carotid lumen as the denominator for stenosis measurement. The following velocity measurements were obtained: RIGHT ICA: 241 cm/sec CCA: 77 cm/sec SYSTOLIC ICA/CCA RATIO:  3.1 ECA: 259 cm/sec LEFT ICA: 160 cm/sec Bulb: 213 cm/sec CCA: 77 cm/sec SYSTOLIC ICA/CCA RATIO:  2.1 ECA: 287 cm/sec RIGHT CAROTID ARTERY: Irregular echogenic plaque with shadowing at the right carotid bulb. Elevated peak systolic velocity in the proximal external carotid artery compatible with stenosis near the origin. This finding corresponds with the recent CTA findings. Elevated peak systolic velocity in the proximal right internal carotid artery measuring 241 cm/sec. Mid and distal right internal carotid artery are patent. RIGHT VERTEBRAL ARTERY: Antegrade flow and normal waveform in the right vertebral artery. LEFT CAROTID ARTERY: Echogenic plaque at the left carotid bulb. Elevated peak systolic velocity at the left carotid bulb measuring 214 cm/sec. Elevated peak systolic velocity in the left external carotid artery. Echogenic plaque in the proximal internal carotid artery. Peak systolic velocity in the proximal internal carotid artery is 160 cm/sec. Mid and distal left internal carotid artery are patent. LEFT VERTEBRAL ARTERY: Antegrade flow and normal waveform in the left vertebral artery. IMPRESSION: 1. Atherosclerotic plaque involving bilateral carotid arteries. 2. Estimated degree of stenosis in the right internal carotid artery is greater than 70%. 3. Estimated degree of stenosis at the left carotid bulb and proximal left internal carotid artery is 50-69%. 4. Stenosis involving bilateral external carotid arteries. 5. Patent vertebral arteries bilaterally. Electronically Signed   By: Juliene Balder M.D.   On: 06/13/2024 14:41   ECHOCARDIOGRAM COMPLETE Result Date: 06/13/2024    ECHOCARDIOGRAM REPORT   Patient Name:   DENNI FRANCE Date of Exam: 06/12/2024 Medical Rec  #:  [id number]      Height:       70.0 in Accession #:    [id number]     Weight:       160.0 lb Date of Birth:  Jul 24, 1936       BSA:          1.898 m Patient Age:    87 years       BP:           113/63 mmHg Patient Gender: F              HR:           60 bpm. Exam Location:  [facility] Procedure: 2D Echo, Cardiac Doppler and Color Doppler (Both Spectral and Color  Flow Doppler were utilized during procedure). Indications:     G45.9 TIA  History:         Patient has no prior history of Echocardiogram examinations.                  Risk Factors:Hypertension.  Sonographer:     Carl Coma RDCS Referring Phys:  [id number] CORT ONEIDA MANA Diagnosing Phys: Evalene Lunger MD IMPRESSIONS  1. Left ventricular ejection fraction, by estimation, is 60 to 65%. The left ventricle has normal function. The left ventricle has no regional wall motion abnormalities. Left ventricular diastolic parameters are consistent with Grade I diastolic dysfunction (impaired relaxation).  2. Right ventricular systolic function is normal. The right ventricular size is normal. Tricuspid regurgitation signal is inadequate for assessing PA pressure.  3. The mitral valve is normal in structure. No evidence of mitral valve regurgitation. No evidence of mitral stenosis.  4. The aortic valve is normal in structure. Aortic valve regurgitation is not visualized. Aortic valve sclerosis/calcification is present, without any evidence of aortic stenosis.  5. The inferior vena cava is normal in size with greater than 50% respiratory variability, suggesting right atrial pressure of 3 mmHg. FINDINGS  Left Ventricle: Left ventricular ejection fraction, by estimation, is 60 to 65%. The left ventricle has normal function. The left ventricle has no regional wall motion abnormalities. Strain was performed and the global longitudinal strain is indeterminate. The left ventricular internal cavity size was normal in size. There is no left ventricular hypertrophy.  Left ventricular diastolic parameters are consistent with Grade I diastolic dysfunction (impaired relaxation). Right Ventricle: The right ventricular size is normal. No increase in right ventricular wall thickness. Right ventricular systolic function is normal. Tricuspid regurgitation signal is inadequate for assessing PA pressure. Left Atrium: Left atrial size was normal in size. Right Atrium: Right atrial size was normal in size. Pericardium: There is no evidence of pericardial effusion. Mitral Valve: The mitral valve is normal in structure. Mild mitral annular calcification. No evidence of mitral valve regurgitation. No evidence of mitral valve stenosis. Tricuspid Valve: The tricuspid valve is normal in structure. Tricuspid valve regurgitation is mild . No evidence of tricuspid stenosis. Aortic Valve: The aortic valve is normal in structure. Aortic valve regurgitation is not visualized. Aortic valve sclerosis/calcification is present, without any evidence of aortic stenosis. Pulmonic Valve: The pulmonic valve was normal in structure. Pulmonic valve regurgitation is not visualized. No evidence of pulmonic stenosis. Aorta: The aortic root is normal in size and structure. Venous: The inferior vena cava is normal in size with greater than 50% respiratory variability, suggesting right atrial pressure of 3 mmHg. IAS/Shunts: No atrial level shunt detected by color flow Doppler. Additional Comments: 3D was performed not requiring image post processing on an independent workstation and was indeterminate.  LEFT VENTRICLE PLAX 2D LVIDd:         3.90 cm   Diastology LVIDs:         2.20 cm   LV e' medial:    5.71 cm/s LV PW:         1.00 cm   LV E/e' medial:  16.1 LV IVS:        1.00 cm   LV e' lateral:   5.33 cm/s LVOT diam:     2.00 cm   LV E/e' lateral: 17.2 LV SV:         88 LV SV Index:   46 LVOT Area:     3.14 cm  RIGHT VENTRICLE  RV Basal diam:  3.20 cm RV S prime:     15.30 cm/s TAPSE (M-mode): 2.8 cm LEFT ATRIUM              Index        RIGHT ATRIUM          Index LA diam:        3.50 cm 1.84 cm/m   RA Area:     8.71 cm LA Vol (A2C):   30.3 ml 15.96 ml/m  RA Volume:   19.70 ml 10.38 ml/m LA Vol (A4C):   28.2 ml 14.85 ml/m LA Biplane Vol: 30.6 ml 16.12 ml/m  AORTIC VALVE LVOT Vmax:   120.50 cm/s LVOT Vmean:  77.750 cm/s LVOT VTI:    0.280 m  AORTA Ao Root diam: 3.30 cm Ao Asc diam:  3.40 cm MITRAL VALVE MV Area (PHT): 2.48 cm     SHUNTS MV Decel Time: 306 msec     Systemic VTI:  0.28 m MV E velocity: 91.70 cm/s   Systemic Diam: 2.00 cm MV A velocity: 129.50 cm/s MV E/A ratio:  0.71 Evalene Lunger MD Electronically signed by Evalene Lunger MD Signature Date/Time: 06/13/2024/2:14:35 PM    Final    MR BRAIN WO CONTRAST Result Date: 06/12/2024 EXAM: MRI BRAIN WITHOUT CONTRAST 06/12/2024 04:28:07 PM TECHNIQUE: Multiplanar multisequence MRI of the head/brain was performed without the administration of intravenous contrast. Best obtainable images due to patient becoming claustrophobic and refusing to continue exam. Axial and coronal DWI and ADC images were obtained. These images are noted by motion artifact. COMPARISON: Same day CTA head and neck. CLINICAL HISTORY: Transient ischemic attack (TIA). FINDINGS: BRAIN AND VENTRICLES: There are areas of acute infarct within the right frontal lobe involving the corona radiata and periventricular white matter. There is periventricular volume loss and findings suggestive of chronic microvascular ischemic changes. No midline shift. No hydrocephalus. IMPRESSION: 1. Patient unable to complete exam due to claustrophobia. Limited DWI and ADC images obtained. Motion artifact. 2. Within these limitations, acute infarcts noted within the right frontal lobe involving the corona radiata and periventricular white matter. Electronically signed by: Donnice Mania MD 06/12/2024 05:20 PM EDT RP Workstation: [id number]   CT ANGIO HEAD NECK W WO CM Addendum Date: 06/12/2024 ~~~~ADDENDUM #1 ~~~~ EXAM: CT  HEAD WITHOUT CTA HEAD AND NECK WITH AND WITHOUT 06/12/2024 04:10:00 PM TECHNIQUE: CTA of the head and neck was performed with and without the administration of intravenous contrast. Noncontrast CT of the head with reconstructed 2-D images are also provided for review. Multiplanar 2D and/or 3D reformatted images are provided for review. Automated exposure control, iterative reconstruction, and/or weight based adjustment of the mA/kV was utilized to reduce the radiation dose to as low as reasonably achievable. COMPARISON: CT head earlier same day as well as carotid ultrasound dated 07/01/2024. CLINICAL HISTORY: Stroke/TIA, assess for intracardiac shunt. Pt presents with R side weakness, aphasia that started 8/4. She did have a fall at that time and has been having L hip pain. Pt saw her PCP yesterday and was started on ASA. FINDINGS: CT HEAD: BRAIN AND VENTRICLES: No acute intracranial hemorrhage. No mass effect. No midline shift. No extra-axial fluid collection. Gray-white differentiation is maintained. No hydrocephalus. ORBITS: No acute abnormality. SINUSES AND MASTOIDS: No acute abnormality. CTA NECK: AORTIC ARCH AND ARCH VESSELS: Mixed atherosclerotic plaque within the visualized aortic arch. Atherosclerosis involves the origin of the left subclavian artery without high-grade stenosis. CERVICAL CAROTID ARTERIES: The right carotid artery is patent from  the origin to the skull base. Mixed attenuation atherosclerotic plaque at the carotid bifurcation with prominent noncalcified component extending to the carotid bulb. There is approximately 75% stenosis at the portion of the right cervical ICA within the proximal right cervical ICA. There is intraluminal soft tissue concerning for focal intraluminal prominence visualized on series 6 image 229 and on series 8 image 57. Intraluminal thrombus is associated with approximately 50% luminal narrowing. Additional mixed atherosclerotic plaque in the distal right cervical ICA  without hemodynamically significant stenosis. There is bulky calcified atherosclerosis at the left carotid bifurcation resulting in approximately 60% stenosis of the origin of the left cervical ICA. CERVICAL VERTEBRAL ARTERIES: Patent vertebral arteries in the neck. LUNGS AND MEDIASTINUM: Biapical pleural parenchymal scarring. SOFT TISSUES: No acute abnormality. BONES: Degenerative changes in the visualized spine. There is fusion of the C5 and C6 vertebral bodies. CTA HEAD: ANTERIOR CIRCULATION: The intracranial internal carotid arteries are patent bilaterally. The MCAs are patent bilaterally. The ACAs are patent bilaterally. 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 POSTERIOR CIRCULATION: Diminutive appearance of the basilar artery with significant distal tapering of the basilar artery after the origin of the superior cerebellar arteries. The PCAs are patent bilaterally and are primarily supplied by the anterior circulation. OTHER: No dural venous sinus thrombosis on this non-dedicated study. IMPRESSION: 1. Mixed atherosclerotic plaque at the right carotid bifurcation resulting in approximately 75% stenosis of the right cervical ICA origin. Focal intraluminal soft tissue in the proximal right cervical ICA concerning for intraluminal thrombus with approximately 50% luminal narrowing. 2. Approximately 60% stenosis of the origin of the left cervical ICA due to calcified atherosclerosis. 3. Additional atherosclerosis as above. 4. Findings suggestive of vertebrobasilar hypoplasia. 5. Impression point #1 discussed with Dr. Laurita at 4:42PM on 06/12/24. Electronically signed by: Donnice Mania MD 06/12/2024 04:55 PM EDT RP Workstation: [id number]   Result Date: 06/12/2024 ~~~~ORIGINAL REPORT ~~~~ EXAM: CT HEAD WITHOUT CTA HEAD AND NECK WITH AND WITHOUT 06/12/2024 04:10:00 PM TECHNIQUE: CTA of the head and neck was performed with and without the administration of intravenous contrast. Noncontrast  CT of the head with reconstructed 2-D images are also provided for review. Multiplanar 2D and/or 3D reformatted images are provided for review. Automated exposure control, iterative reconstruction, and/or weight based adjustment of the mA/kV was utilized to reduce the radiation dose to as low as reasonably achievable. COMPARISON: CT head earlier same day as well as carotid ultrasound dated 07/01/2024. CLINICAL HISTORY: Stroke/TIA, assess for intracardiac shunt. Pt presents with R side weakness, aphasia that started 8/4. She did have a fall at that time and has been having L hip pain. Pt saw her PCP yesterday and was started on ASA. FINDINGS: CT HEAD: BRAIN AND VENTRICLES: No acute intracranial hemorrhage. No mass effect. No midline shift. No extra-axial fluid collection. Gray-white differentiation is maintained. No hydrocephalus. ORBITS: No acute abnormality. SINUSES AND MASTOIDS: No acute abnormality. CTA NECK: AORTIC ARCH AND ARCH VESSELS: Mixed atherosclerotic plaque within the visualized aortic arch. Atherosclerosis involves the origin of the left subclavian artery without high-grade stenosis. CERVICAL CAROTID ARTERIES: The right carotid artery is patent from the origin to the skull base. Mixed attenuation atherosclerotic plaque at the carotid bifurcation with prominent noncalcified component extending to the carotid bulb. There is approximately 75% stenosis at the portion of the right cervical ICA within the proximal right cervical ICA. There is intraluminal soft tissue concerning for focal intraluminal prominence visualized on series 6 image 229 and on series 8 image  57. Intraluminal thrombus is associated with approximately 50% luminal narrowing. Additional mixed atherosclerotic plaque in the distal right cervical ICA without hemodynamically significant stenosis. There is bulky calcified atherosclerosis at the left carotid bifurcation resulting in approximately 60% stenosis of the origin of the left cervical  ICA. CERVICAL VERTEBRAL ARTERIES: Patent vertebral arteries in the neck. LUNGS AND MEDIASTINUM: Biapical pleural parenchymal scarring. SOFT TISSUES: No acute abnormality. BONES: Degenerative changes in the visualized spine. There is fusion of the C5 and C6 vertebral bodies. CTA HEAD: ANTERIOR CIRCULATION: The intracranial internal carotid arteries are patent bilaterally. The MCAs are patent bilaterally. The ACAs are patent bilaterally. 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 POSTERIOR CIRCULATION: Diminutive appearance of the basilar artery with significant distal tapering of the basilar artery after the origin of the superior cerebellar arteries. The PCAs are patent bilaterally and are primarily supplied by the anterior circulation. OTHER: No dural venous sinus thrombosis on this non-dedicated study. IMPRESSION: 1. Mixed atherosclerotic plaque at the right carotid bifurcation resulting in approximately 75% stenosis of the right cervical ICA origin. Focal intraluminal soft tissue in the proximal right cervical ICA concerning for intraluminal thrombus with approximately 50% luminal narrowing. 2. Approximately 60% stenosis of the origin of the left cervical ICA due to calcified atherosclerosis. 3. Additional atherosclerosis as above. 4. Findings suggestive of vertebrobasilar hypoplasia. Electronically signed by: Donnice Mania MD 06/12/2024 04:40 PM EDT RP Workstation: [id number]   DG Hip Unilat With Pelvis 2-3 Views Left Result Date: 06/12/2024 EXAM: 2 or 3 VIEW(S) XRAY OF THE PELVIS AND LEFT HIP 06/12/2024 12:45:46 PM COMPARISON: 04/25/2021 CLINICAL HISTORY: Fall. FINDINGS: JOINTS: Postoperative changes from previous left total hip arthroplasty. No signs of acute fracture or dislocation. Chronic left superior and inferior pubic rami fractures identified. Degenerative changes noted within the right hip. 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  SOFT TISSUES: Mild osteopenia. IMPRESSION: 1. No acute fracture or dislocation of the left hip. 2. Chronic left superior and inferior pubic rami fractures. 3. Degenerative changes in the right hip. 4. 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 Electronically signed by: Waddell Calk MD 06/12/2024 02:04 PM EDT RP Workstation: [id number]   CT HEAD WO CONTRAST Result Date: 06/12/2024 EXAM: CT HEAD WITHOUT CONTRAST 06/12/2024 12:24:12 PM TECHNIQUE: CT of the head was performed without the administration of intravenous contrast. Automated exposure control, iterative reconstruction, and/or weight based adjustment of the mA/kV was utilized to reduce the radiation dose to as low as reasonably achievable. COMPARISON: 07/13/2021 CLINICAL HISTORY: Neuro deficit, acute, stroke suspected. Pt presents with R side weakness, aphasia that started 8/4. She did have a fall at that time and has been having L hip pain. Pt saw her PCP yesterday and was started on ASA. Pt continued to have progressive worsening of weakness and was having difficulty feeding herself last night. Pt is A/Ox4 during triage with slow slurred speech noted. FINDINGS: BRAIN AND VENTRICLES: No acute hemorrhage. Gray-white differentiation is preserved. No hydrocephalus. No extra-axial collection. No mass effect or midline shift. Nonspecific hypoattenuation in the periventricular and subcortical white matter, most likely representing chronic small vessel disease. Generalized parenchymal volume loss. ORBITS: No acute abnormality. SINUSES: No acute abnormality. SOFT TISSUES AND SKULL: No acute soft tissue abnormality. No skull fracture. VASCULATURE: Atherosclerosis in the carotid siphons. IMPRESSION: 1. No acute intracranial abnormality. 2. Nonspecific hypoattenuation in the periventricular and subcortical white matter, most likely representing chronic small vessel disease. Electronically  signed by: Donnice Mania MD 06/12/2024 01:05 PM EDT  RP Workstation: [id number]          Laneta Blunt, DO [facility] 06/13/2024, 5:09 PM    Dictation software may have been used to generate the above note. Typos may occur and escape review in typed/dictated notes. Please contact Dr Blunt directly for clarity if needed.  Staff may message me via secure chat in Epic  but this may not receive an immediate response,  please page me for urgent matters!  If 7PM-7AM, please contact night coverage [email or web address]

## 2024-06-13 NOTE — Evaluation (Signed)
 Occupational Therapy Evaluation Patient Details Name: Lori Wallace MRN: [id number] DOB: 04-24-1936 Today's Date: 06/13/2024   History of Present Illness   Pt is a 88 y.o. female presented with strokelike symptoms. 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 PMH of HTN, skin cancer, breast cancer s/p bilateral lumpectomy.     Clinical Impressions Pt was seen for OT evaluation this date. PTA, pt was living at home alone and MOD I with ADL management and mobility using a RW. Family was checking in nearly daily and providing meals for pt to heat up in the microwave. Pt with no recent falls.  Pt presents to acute OT demonstrating impaired ADL performance and functional mobility 2/2 L sided weakness, LUE FMC/GMC deficits, impaired balance and cognition. Pt seemingly able to identify R and L. Bed mobility performed with Mod A for BLE management, cues for use of bed rail. MAX A x2 to scoot to [facility] once returned to supine, Min/mod A to roll in bed. Pt sat at EOB x20 mins with CGA progressing to Min A with fatigue and L lateral and posterior lean with cues to correct. Pt is R handed, so able to feed self with supervision, cues to utilize LUE and pt able to cross midline to retrieve drink with slowed dexterity and coordination noted. Opposition and finger to nose intact on L side, but slowed and mild deficit present. Pt did need cues for pacing during self feeding. Pt fatigued easily with sitting EOB, therefore returned to supine and deferred standing. Pt would benefit from skilled OT services to address noted impairments and functional limitations to maximize safety and independence while minimizing falls risk and caregiver burden. Do anticipate the need for follow up OT services upon acute hospital DC.      If plan is discharge home, recommend the following:   A lot of help with bathing/dressing/bathroom;A lot of  help with walking and/or transfers;Direct supervision/assist for medications management;Supervision due to cognitive status;Direct supervision/assist for financial management;Assist for transportation;Assistance with cooking/housework;Help with stairs or ramp for entrance     Functional Status Assessment   Patient has had a recent decline in their functional status and demonstrates the ability to make significant improvements in function in a reasonable and predictable amount of time.     Equipment Recommendations   Other (comment) (defer)     Recommendations for Other Services         Precautions/Restrictions   Precautions Precautions: Fall Recall of Precautions/Restrictions: Impaired Restrictions Weight Bearing Restrictions Per Provider Order: No     Mobility Bed Mobility Overal bed mobility: Needs Assistance Bed Mobility: Supine to Sit, Sit to Supine     Supine to sit: Mod assist, HOB elevated, Used rails Sit to supine: Mod assist   General bed mobility comments: Mod A for BLE management, cues for bed rail use then pt able to push up into sitting, Mod A to scoot forward to EOB; sat at EOB ~20 mins with CGA and posterior lean with cues to correct, L lateral lean with fatigue by end of session; Mod A for to return to supine and Max a X2 to scoot to [facility]    Transfers                   General transfer comment: deferred d/t increased assist needed with PT      Balance Overall balance assessment: Needs assistance Sitting-balance support: Feet supported Sitting balance-Leahy  Scale: Poor Sitting balance - Comments: CGA for seated balance at EOB progressing to min A with fatigue       Standing balance comment: deferred                           ADL either performed or assessed with clinical judgement   ADL Overall ADL's : Needs assistance/impaired Eating/Feeding: Supervision/ safety;Cueing for safety;Sitting Eating/Feeding Details (indicate  cue type and reason): seated EOB, pt able to self feed using R hand without difficulty, but requires cues for pacing and chewing food all the way before putting more food in; able to reach across midline to get ensure using L hand with CGA to prevent dropping                                         Vision         Perception         Praxis         Pertinent Vitals/Pain Pain Assessment Pain Assessment: No/denies pain     Extremity/Trunk Assessment Upper Extremity Assessment Upper Extremity Assessment: Generalized weakness;Right hand dominant;LUE deficits/detail LUE Deficits / Details: notable L sided weakness LUE Coordination: decreased fine motor;decreased gross motor   Lower Extremity Assessment Lower Extremity Assessment: Generalized weakness       Communication Communication Communication: No apparent difficulties   Cognition Arousal: Alert Behavior During Therapy: WFL for tasks assessed/performed (general confusion)                                 Following commands: Impaired Following commands impaired: Follows one step commands inconsistently     Cueing  General Comments   Cueing Techniques: Verbal cues      Exercises Other Exercises Other Exercises: Edu on role of OT in acutes etting and FMC/GMC exercises that niece could perform with pt while hospitalized.   Shoulder Instructions      Home Living Family/patient expects to be discharged to:: Skilled nursing facility Living Arrangements: Alone Available Help at Discharge: Family;Available PRN/intermittently Type of Home: House                           Additional Comments: at [place] pt lives alone but generally family checks in daily helping with meals and cleaning, uses 4WW.  Lives With: Alone    Prior Functioning/Environment Prior Level of Function : Needs assist             Mobility Comments: in home ambulation only with FWW, no fall in last 6  months apart from those this week. ADLs Comments: Pt does her own sponge baths, family helps with meals, home management, all OOH/driving, etc    OT Problem List: Decreased strength;Decreased activity tolerance;Decreased cognition;Decreased safety awareness;Impaired balance (sitting and/or standing);Decreased coordination   OT Treatment/Interventions: Self-care/ADL training;Therapeutic exercise;Therapeutic activities;Neuromuscular education;Patient/family education;DME and/or AE instruction;Balance training      OT Goals(Current goals can be found in the care plan section)   Acute Rehab OT Goals Patient Stated Goal: get stronger and improve function OT Goal Formulation: With patient/family Time For Goal Achievement: 06/27/24 Potential to Achieve Goals: Fair ADL Goals Pt Will Perform Upper Body Bathing: sitting;with supervision Pt Will Perform Lower Body Dressing: with min assist;sit to/from stand;sitting/lateral leans Pt  Will Transfer to Toilet: with mod assist;ambulating;regular height toilet;bedside commode;grab bars   OT Frequency:  Min 2X/week    Co-evaluation              AM-PAC OT 6 Clicks Daily Activity     Outcome Measure Help from another person eating meals?: A Little Help from another person taking care of personal grooming?: A Little Help from another person toileting, which includes using toliet, bedpan, or urinal?: A Lot Help from another person bathing (including washing, rinsing, drying)?: A Lot Help from another person to put on and taking off regular upper body clothing?: A Lot Help from another person to put on and taking off regular lower body clothing?: A Lot 6 Click Score: 14   End of Session Nurse Communication: Mobility status  Activity Tolerance: Patient tolerated treatment well Patient left: in bed;with call bell/phone within reach;with bed alarm set;with family/visitor present  OT Visit Diagnosis: Other abnormalities of gait and mobility  (R26.89);Unsteadiness on feet (R26.81);Muscle weakness (generalized) (M62.81)                Time: 8672-8649 OT Time Calculation (min): 23 min Charges:  OT General Charges $OT Visit: 1 Visit OT Evaluation $OT Eval Moderate Complexity: 1 Mod OT Treatments $Self Care/Home Management : 8-22 mins Cree Napoli, OTR/L  06/13/24, 4:57 PM  Lilyana Lippman E Khaden Gater 06/13/2024, 4:52 PM

## 2024-06-13 NOTE — Plan of Care (Signed)

## 2024-06-13 NOTE — Evaluation (Signed)
 Physical Therapy Evaluation Patient Details Name: Lori Wallace MRN: [id number] DOB: [birth year] Today's Date: 06/13/2024  History of Present Illness  a 88 y.o. female with medical history significant of HTN, skin cancer, breast cancer status post bilateral lumpectomy, presented with strokelike symptoms.  Having head ache for a few days, general weakness ( L more than R), fall, slurred speech and confusion.  Clinical Impression  Pt showed good effort and willingness to participate but was significantly limited from a functional outlook.  She appeared to have weak, but grossly functional and = strength in b/l extremities, but had a constant L lean in sitting that she could not correct despite a whole lot of cuing and assist.  She was very willing to participate but ultimately needed heavy assist with 2 partial standing efforts and was not safe to consider stepping away from the bed, etc.  Pt ultimately far from her baseline, will need continued PT to address functional limitations.       If plan is discharge home, recommend the following: Two people to help with walking and/or transfers;A lot of help with bathing/dressing/bathroom;Assistance with cooking/housework;Assist for transportation;Help with stairs or ramp for entrance   Can travel by private vehicle   No    Equipment Recommendations  (TBD at next venue)  Recommendations for Other Services       Functional Status Assessment Patient has had a recent decline in their functional status and demonstrates the ability to make significant improvements in function in a reasonable and predictable amount of time.     Precautions / Restrictions Precautions Precautions: Fall Recall of Precautions/Restrictions: Impaired Restrictions Weight Bearing Restrictions Per Provider Order: No      Mobility  Bed Mobility Overal bed mobility: Needs Assistance Bed Mobility: Supine to Sit, Sit to Supine     Supine to sit: Mod assist Sit to supine:  Mod assist   General bed mobility comments: Pt showed some good effort with mobility but ultimately needed assist with most aspects and struggled with most aspects of mobility and heavy lean to the L t/o all mobility    Transfers Overall transfer level: Needs assistance Equipment used: Rolling walker (2 wheels) Transfers: Sit to/from Stand Sit to Stand: Max assist           General transfer comment: Pt made decent effort in getting up to standing but even with heavy PT assist, even with excessive cuing she could not able to shift posturally upright or enough to the R to make static standing viable with anything other than heavy assist    Ambulation/Gait               General Gait Details: unable to take even a small side steps with assisted standing EOB  Stairs            Wheelchair Mobility     Tilt Bed    Modified Rankin (Stroke Patients Only)       Balance Overall balance assessment: Modified Independent                                           Pertinent Vitals/Pain Pain Assessment Pain Assessment: Faces Faces Pain Scale: Hurts even more Pain Location: L hip area    Home Living Family/patient expects to be discharged to:: Skilled nursing facility Living Arrangements: Alone Available Help at Discharge: Family Type of Home: House  Additional Comments: at [place] pt lives alone but generally family checks in daily helping with meals and cleaning, uses 4WW.    Prior Function Prior Level of Function : Needs assist             Mobility Comments: in home ambulation only with FWW, no fall in last 6 months apart from those this week. ADLs Comments: Pt does her own sponge baths, family helps with meals, home management, all OOH/driving, etc     Extremity/Trunk Assessment   Upper Extremity Assessment Upper Extremity Assessment: Generalized weakness (b/l UEs display general but minimal focal weakness.  L shoulder  elevation limited as per R, but grip, elbow, etc near = b/l)    Lower Extremity Assessment Lower Extremity Assessment: Generalized weakness (b/l LEs display general but minimal focal weakness. L ankle DF decreased as per R, L hip slightly weaker though possibly related to pain rather than true weakness)       Communication   Communication Communication: No apparent difficulties    Cognition Arousal: Alert Behavior During Therapy: WFL for tasks assessed/performed (general confusion)   PT - Cognitive impairments: Orientation, Awareness, Safety/Judgement   Orientation impairments:  (knew name and month, reports being in her living room multiple times despite cues to the contrary.)                   PT - Cognition Comments: Pt with significant confusion, Niece reports that pt is very far from her baseline with speech, cogntion and in general Following commands: Impaired Following commands impaired: Follows one step commands inconsistently     Cueing       General Comments General comments (skin integrity, edema, etc.): Pt showed good effort when able, but leaning consistently to the L, struggling to coordinate most mobility cues - far from her baseline function    Exercises     Assessment/Plan    PT Assessment Patient needs continued PT services  PT Problem List Decreased strength;Decreased range of motion;Decreased activity tolerance;Decreased mobility;Decreased balance;Decreased knowledge of use of DME;Decreased safety awareness;Pain;Decreased cognition       PT Treatment Interventions DME instruction;Gait training;Functional mobility training;Therapeutic activities;Therapeutic exercise;Balance training;Neuromuscular re-education;Cognitive remediation;Patient/family education    PT Goals (Current goals can be found in the Care Plan section)  Acute Rehab PT Goals Patient Stated Goal: get walking again PT Goal Formulation: With patient/family Time For Goal  Achievement: 06/26/24 Potential to Achieve Goals: Fair    Frequency Min 3X/week     Co-evaluation               AM-PAC PT 6 Clicks Mobility  Outcome Measure Help needed turning from your back to your side while in a flat bed without using bedrails?: A Lot Help needed moving from lying on your back to sitting on the side of a flat bed without using bedrails?: A Lot Help needed moving to and from a bed to a chair (including a wheelchair)?: A Lot Help needed standing up from a chair using your arms (e.g., wheelchair or bedside chair)?: A Lot Help needed to walk in hospital room?: Total Help needed climbing 3-5 steps with a railing? : Total 6 Click Score: 10    End of Session Equipment Utilized During Treatment: Gait belt Activity Tolerance: Patient tolerated treatment well;Patient limited by fatigue Patient left: with bed alarm set;with call bell/phone within reach Nurse Communication: Mobility status PT Visit Diagnosis: Muscle weakness (generalized) (M62.81);Difficulty in walking, not elsewhere classified (R26.2);Other symptoms and signs involving the nervous  system (R29.898);Unsteadiness on feet (R26.81)    Time: 9086-9054 PT Time Calculation (min) (ACUTE ONLY): 32 min   Charges:   PT Evaluation $PT Eval Moderate Complexity: 1 Mod PT Treatments $Therapeutic Activity: 8-22 mins PT General Charges $$ ACUTE PT VISIT: 1 Visit         [name], DPT 06/13/2024, 11:42 AM

## 2024-06-13 NOTE — Care Management Obs Status (Signed)
 MEDICARE OBSERVATION STATUS NOTIFICATION   Patient Details  Name: ABRIE EGLOFF MRN: [id number] Date of Birth: [birth year]   Medicare Observation Status Notification Given:  Yes    Makaylee Spielberg W, CMA 06/13/2024, 9:52 AM

## 2024-06-13 NOTE — Evaluation (Signed)
 Speech Language Pathology Evaluation Patient Details Name: Lori Wallace MRN: [id number] DOB: October 28, 1936 Today's Date: 06/13/2024 Time: 1040-1110 SLP Time Calculation (min) (ACUTE ONLY): 30 min  Problem List:  Patient Active Problem List   Diagnosis Date Noted   TIA (transient ischemic attack) 06/12/2024   Cervical spine fracture (HCC) 04/25/2021   Fall at home, initial encounter 04/25/2021   Fracture of pubic ramus, left, closed, initial encounter (HCC) 04/25/2021   Normocytic anemia 04/25/2021   Closed fracture of ramus of left pubis (HCC)    Malignant neoplasm of breast (HCC) 01/06/2016   Left breast mass 01/06/2016   Essential hypertension, benign 01/06/2016   Compression fracture 10/23/2014   Breast cancer (HCC) 12/03/2013   Osteoarthritis of left hip 08/01/2012   Past Medical History:  Past Medical History:  Diagnosis Date   Actinic keratosis    Arthritis    Breast cancer (HCC) [phone]   Cancer (HCC)    breast s/p bilateral lumpectomies with radiation,   Complication of anesthesia    Pt stated her top lip was puffed up and felt strange for weeks after having hip surgery. Pt thinks intubation appliance may have caused it   Constipation due to pain medication    Environmental and seasonal allergies    Frequency of urination    Hx of basal cell carcinoma 08/19/2010   Mid dorsum nose supra tip   Hypertension    pt has not had any blood pressure medications in over 4 years   Malignant neoplasm of upper-outer quadrant of female breast (HCC) 1995   [place]  wide excision, reexcision, whole breast radiation.   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 T1a   Malignant neoplasm of upper-outer quadrant of female breast (HCC) 2002   Right breast   Squamous cell carcinoma of skin 11/23/2007   Right lateral upper eyelid. SCCis   Squamous cell carcinoma of skin  09/03/2019   Right lateral pretibia. EDC.    Squamous cell carcinoma of skin 03/30/2020   R lateral pretibia    Squamous cell carcinoma of skin 03/09/2022   R upper arm, EDC   Trigger finger of both hands    Past Surgical History:  Past Surgical History:  Procedure Laterality Date   BACK SURGERY  1960s   lumbar ruptured discs   BREAST BIOPSY     BREAST LUMPECTOMY Left 1995 with Radiation   BREAST LUMPECTOMY Right 2002   with Radiation   BREAST LUMPECTOMY Left 2011   BREAST SURGERY Bilateral    lumpectomies x 3   COLONOSCOPY  2006   Dr. Viktoria   EYE SURGERY  1990s   bilat cataract removal   FRACTURE SURGERY     L wrist repair, R lower leg   JOINT REPLACEMENT     R knee w/prior arhtroscopy   KYPHOPLASTY N/A 10/23/2014   Procedure: KYPHOPLASTY;  Surgeon: Oneil Rodgers Priestly, MD;  Location: [facility] OR;  Service: Orthopedics;  Laterality: N/A;  T 12 kyphoplasty   TOTAL HIP ARTHROPLASTY  07/30/2012   Procedure: TOTAL HIP ARTHROPLASTY;  Surgeon: Dempsey JINNY Sensor, MD;  Location: [facility] OR;  Service: Orthopedics;  Laterality: Left;   uterine tumor excision     benign   WRIST SURGERY Left    HPI:  Per H&P,  Lori Wallace is a 88 y.o. female with medical history significant of HTN, skin cancer, breast cancer status post bilateral lumpectomy,  presented with strokelike symptoms. 2 nieces gave history at bedside. Patient lives by herself with one of the nieces believes that has her neighbor. At baseline she is quite independent uses roller walker to ambulate and feed herself and able to bathe herself. Last weekend, patient started to have headaches for 3 days and then since Monday, patient was found to have severe weakness more on the left side, family helped her to go to bathroom and she collapsed on her way but no loss of consciousness or head injury. Monday night, patient fell while getting out of bed first off and hit her left hip. She was able to get back to her bed and called family. Family  came to her home and found she had weakness on the left side and slurred speech. Patient was seen by PCP and PCP suspect patient had a TIA and started her on aspirin . Also yesterday, family noticed the patient shoveling food in her mouth, but not swallowing, and in the evening again family noticed patient had speech problem and confusion and weakness on one side of the limbs and decided to bring her to ED. Family also reported the patient appears to have memory deficiency developed recently. MRI Patient unable to complete exam due to claustrophobia. Limited DWI and ADC images obtained. Motion artifact. Within these limitations, acute infarcts noted within the right frontal lobe involving the corona radiata and periventricular white matter.  Assessment / Plan / Recommendation Clinical Impression  Pt seen for cognitive linguistic evaluation in the setting of acute CVA. Chart review revealing diagnosis of MCI with short term memory loss (per PCP note 01/08/24). Family endorsed increased memory challenges and recent need to start assisting with medication management. Family reported increased confusion starting at the beginning of this week.Today's assessment consisting of pt/family interview and dynamic assessment. Overall, pt lethargic during questioning, limiting sustained attention to task. Pt oriented self, month, year, location independently, with verbal cues aiding increased specificity. Verbal cues (binary options and semantic cues) and repetition bolstered cognitive processing. Independent expressive/receptive language demonstrated during brief verbal exchange. Motor speech notable reduced articulation precision (mumbled quality, low volume). Oral motor function grossly intact. Pt with limited awareness for current deficits. Education shared regarding role of speech therapy, plan for further assessment, and likely acute (infarct) on chronic (MCI) nature of current cognitive impairment. Family reported  understanding. Intervention halted with arrival of neurologist. Recommend continued assessment to determine approximation to baseline and follow up recommendations. Pt to benefit form SLP services at discharge. Pt likely to need assistance with iADLs/higher level cognitive tasks. Pt/family in agreement with stated plan.      SLP Assessment  SLP Recommendation/Assessment: Patient needs continued Speech Language Pathology Services SLP Visit Diagnosis: Dysarthria and anarthria (R47.1);Cognitive communication deficit (R41.841)     Assistance Recommended at Discharge  Frequent or constant Supervision/Assistance  Functional Status Assessment Patient has had a recent decline in their functional status and demonstrates the ability to make significant improvements in function in a reasonable and predictable amount of time.  Frequency and Duration min 2x/week  2 weeks      SLP Evaluation Cognition  Overall Cognitive Status: Impaired/Different from baseline Arousal/Alertness: Lethargic Orientation Level: Oriented to person;Oriented to place;Oriented to time;Oriented to situation (min verbal cues to clarify date and situation) Attention: Sustained Sustained Attention: Impaired Sustained Attention Impairment: Verbal basic (related to lethargy) Memory: Impaired Memory Impairment:  (baseline deficits, reduced recall for events leading up to hospital admission) Awareness: Impaired Awareness Impairment: Emergent impairment Problem Solving:  (  needs further assessment) Executive Function:  (needs further assessment) Safety/Judgment: Impaired (secondary to reduced insight and recall for use of safety device)       Comprehension  Auditory Comprehension Overall Auditory Comprehension: Appears within functional limits for tasks assessed Visual Recognition/Discrimination Discrimination: Not tested Reading Comprehension Reading Status: Not tested    Expression Expression Primary Mode of Expression:  Verbal Verbal Expression Overall Verbal Expression: Appears within functional limits for tasks assessed Written Expression Written Expression: Not tested   Oral / Motor  Oral Motor/Sensory Function Overall Oral Motor/Sensory Function: Within functional limits (grossly symmetrical- generalized weakness, slow movement) Motor Speech Overall Motor Speech: Impaired Respiration: Within functional limits Phonation: Low vocal intensity (wtih conversational speech- increasing with verbal cues) Resonance: Within functional limits Articulation: Impaired Level of Impairment: Conversation Intelligibility: Intelligible Motor Planning: Within functional limits Effective Techniques: Increased vocal intensity           [place] Dulcinea Kinser Clapp, MS, CCC-SLP Speech Language Pathologist Rehab Services; [facility] [phone] (ascom)   [place] J Clapp 06/13/2024, 11:33 AM

## 2024-06-13 NOTE — Progress Notes (Signed)
 OT Cancellation Note  Patient Details Name: JANAH MCCULLOH MRN: [id number] DOB: [birth year]   Cancelled Treatment:    Reason Eval/Treat Not Completed: Other (comment) Per MD note, CTA finding about the 75% narrowing on the right carotid bifurcation site with signs of distal intramural thrombosis formation. Neurology Dr. Merrianne recommended carotid doppler to confirm CTA finding and permissive HTN. Neuro will see patient in AM to decide next step. Will hold off on OT eval until next step has been decided.   Aasir Daigler E Dakia Schifano 06/13/2024, 7:38 AM

## 2024-06-13 NOTE — Hospital Course (Addendum)
 Hospital course / significant events:   HPI: Lori Wallace is a 88 y.o. female with medical history significant of HTN, skin cancer, breast cancer status post bilateral lumpectomy, presented with strokelike symptoms, including recent L sided weakness, fall w/o LOC, slurred speech. Went to see PCP, suspected TIA, startes on ASA. Pt continues to have speech difficulty and swallowing difficulty, memory problems. Family brought pt to ED 08/06 08/06: hypertensive, bradycardic. Unremarkable CBC, CMP, UA. Admitted to hospitalist for TIA/CVA w/u. Imaging: CTA H/N (+)75% narrowing on the right carotid bifurcation site with signs of distal intramural thrombosis formation. Admitting hospitalist D/W oncall neurology Lori Wallace - he recommended carotid doppler to confirm CTA finding and permissive HTN, will see patient in AM to decide next step. MRI brain unable to complete d/t claustrophobia and motion artifact but despite these limitations MRI did reveal acute infarcts noted within the right frontal lobe involving the corona radiata and periventricular white matter 08/07: Carotid US  today, confirmed stenosis, no mention of thrombus. Vascular surgery consult. Echo EF 60-65, Grade 1 diast df.  08/08: vascular surgery consult - family is leaning toward nonsurgical management but is discussing further before final decision. Await SNF placement for rehab  08/09: family brings MOST form - pt has indicated previously for DNR so orders updated. Pt also indicated limited interventions, was not specific about surgeries but family is leaning toward no endarterectomy / discuss outpatient and treat medically for now.  08/10: awaiting SNF placement thru weekend  08/11: placement pending auth  - auth for [facility], family wants [facility].  08/12: Pt is medically stable. Hospitalist placed dc orders and dispo per [facility] / family / insurance.      Consultants:  Neurology  Vascular surgery    Procedures/Surgeries: none      ASSESSMENT & PLAN:   Acute CVA  Acute left paresis, resolved Acute aphasia, resolved Acute dysphagia ASA, statin  If decide not to have CEA, add Plavix  to ASA and continue DAPT indefinitely. Started Plavix  now PT OT and speech evaluation - rehab pending    CTA H/N clinically significant findings of 75% narrowing on the right carotid bifurcation site with signs of distal intramural thrombosis formation.  ASA Consult to vascular re carotid stenosis --> risk of surgery in light of pt's previously known preferences for no aggressive measures, family is leaning toward no surgery If decide not to have CEA, add Plavix  to ASA and continue DAPT indefinitely. Started Plavix  now  HTN Held atenolol  for permissive HTN, also bradycardic. HR and BP stable now will adjust meds as needed    Bradycardia Likely beta blocker effect Hold atenolol   Fall at home Acute on chronic ambulation impairment No fracture or dislocation on left hip PT OT evaluation   Chronic normocytic anemia H&H stable, no symptoms or signs of active bleeding Monitor CBC  Depression Question early/mild dementia Hospital delirium Holding buproprion  Restart mirtazapine given insomnia Zyprexa  prn severe agitation as this tends to have less risk confusion in elderly, avoid Haldol - per neurology  Required sitter briefly, order d/c 06/16/24 8:38 AM   Constipation  Bowel regimen per orders   No concerns based on BMI: Body mass index is 22.96 kg/m.Lori Wallace Significantly low or high BMI is associated with higher medical risk.  Underweight - under 18  overweight - 25 to 29 obese - 30 or more Class 1 obesity: BMI of 30.0 to 34 Class 2 obesity: BMI of 35.0 to 39 Class 3 obesity: BMI of 40.0 to 49  Super Morbid Obesity: BMI 50-59 Super-super Morbid Obesity: BMI 60+ Healthy nutrition and physical activity advised as adjunct to other disease management and risk reduction  treatments    DVT prophylaxis: lovenox  IV fluids: holding continuous IV fluids  Nutrition: per SLP Central lines / other devices: none  Code Status: FULL CODE ACP documentation reviewed: none on file in [facility]  [facility] needs: SNF rehab placement Medical barriers to dispo: none

## 2024-06-14 DIAGNOSIS — I639 Cerebral infarction, unspecified: Secondary | ICD-10-CM

## 2024-06-14 DIAGNOSIS — G459 Transient cerebral ischemic attack, unspecified: Secondary | ICD-10-CM | POA: Diagnosis not present

## 2024-06-14 LAB — BASIC METABOLIC PANEL WITH GFR
Anion gap: 9 (ref 5–15)
BUN: 37 mg/dL — ABNORMAL HIGH (ref 8–23)
CO2: 26 mmol/L (ref 22–32)
Calcium: 9.4 mg/dL (ref 8.9–10.3)
Chloride: 105 mmol/L (ref 98–111)
Creatinine, Ser: 1.22 mg/dL — ABNORMAL HIGH (ref 0.44–1.00)
GFR, Estimated: 43 mL/min — ABNORMAL LOW (ref >60)
Glucose, Bld: 134 mg/dL — ABNORMAL HIGH (ref 70–99)
Potassium: 4.1 mmol/L (ref 3.5–5.1)
Sodium: 140 mmol/L (ref 135–145)

## 2024-06-14 LAB — CBC
HCT: 41.6 % (ref 36.0–46.0)
Hemoglobin: 13.3 g/dL (ref 12.0–15.0)
MCH: 30.9 pg (ref 26.0–34.0)
MCHC: 32 g/dL (ref 30.0–36.0)
MCV: 96.5 fL (ref 80.0–100.0)
Platelets: 303 K/uL (ref 150–400)
RBC: 4.31 MIL/uL (ref 3.87–5.11)
RDW: 13.4 % (ref 11.5–15.5)
WBC: 11.3 K/uL — ABNORMAL HIGH (ref 4.0–10.5)
nRBC: 0 % (ref 0.0–0.2)

## 2024-06-14 MED ORDER — ROSUVASTATIN CALCIUM 20 MG PO TABS
40.0000 mg | ORAL_TABLET | Freq: Every day | ORAL | Status: DC
  Administered 2024-06-14 – 2024-06-20 (×10): 40 mg via ORAL
  Filled 2024-06-14 (×7): qty 2

## 2024-06-14 NOTE — Plan of Care (Signed)
 Problem: Education: Goal: Knowledge of disease or condition will improve 06/14/2024 0415 by Pola Bridegroom, RN Outcome: Progressing 06/14/2024 0333 by Pola Bridegroom, RN Outcome: Progressing Goal: Knowledge of secondary prevention will improve (MUST DOCUMENT ALL) 06/14/2024 0415 by Pola Bridegroom, RN Outcome: Progressing 06/14/2024 0333 by Pola Bridegroom, RN Outcome: Progressing Goal: Knowledge of patient specific risk factors will improve (DELETE if not current risk factor) 06/14/2024 0415 by Pola Bridegroom, RN Outcome: Progressing 06/14/2024 0333 by Pola Bridegroom, RN Outcome: Progressing   Problem: Ischemic Stroke/TIA Tissue Perfusion: Goal: Complications of ischemic stroke/TIA will be minimized 06/14/2024 0415 by Pola Bridegroom, RN Outcome: Progressing 06/14/2024 0333 by Pola Bridegroom, RN Outcome: Progressing   Problem: Coping: Goal: Will verbalize positive feelings about self 06/14/2024 0415 by Pola Bridegroom, RN Outcome: Progressing 06/14/2024 0333 by Pola Bridegroom, RN Outcome: Progressing Goal: Will identify appropriate support needs 06/14/2024 0415 by Pola Bridegroom, RN Outcome: Progressing 06/14/2024 0333 by Pola Bridegroom, RN Outcome: Progressing   Problem: Health Behavior/Discharge Planning: Goal: Ability to manage health-related needs will improve 06/14/2024 0415 by Pola Bridegroom, RN Outcome: Progressing 06/14/2024 0333 by Pola Bridegroom, RN Outcome: Progressing Goal: Goals will be collaboratively established with patient/family 06/14/2024 0415 by Pola Bridegroom, RN Outcome: Progressing 06/14/2024 0333 by Pola Bridegroom, RN Outcome: Progressing   Problem: Self-Care: Goal: Ability to participate in self-care as condition permits will improve 06/14/2024 0415 by Pola Bridegroom, RN Outcome: Progressing 06/14/2024 0333 by Pola Bridegroom, RN Outcome: Progressing Goal: Verbalization of feelings and concerns over difficulty with self-care will improve 06/14/2024 0415 by Pola Bridegroom, RN Outcome: Progressing 06/14/2024 0333 by Pola Bridegroom, RN Outcome:  Progressing Goal: Ability to communicate needs accurately will improve 06/14/2024 0415 by Pola Bridegroom, RN Outcome: Progressing 06/14/2024 0333 by Pola Bridegroom, RN Outcome: Progressing   Problem: Nutrition: Goal: Risk of aspiration will decrease 06/14/2024 0415 by Pola Bridegroom, RN Outcome: Progressing 06/14/2024 0333 by Pola Bridegroom, RN Outcome: Progressing Goal: Dietary intake will improve 06/14/2024 0415 by Pola Bridegroom, RN Outcome: Progressing 06/14/2024 0333 by Pola Bridegroom, RN Outcome: Progressing   Problem: Education: Goal: Knowledge of General Education information will improve Description: Including pain rating scale, medication(s)/side effects and non-pharmacologic comfort measures 06/14/2024 0415 by Pola Bridegroom, RN Outcome: Progressing 06/14/2024 0333 by Pola Bridegroom, RN Outcome: Progressing   Problem: Health Behavior/Discharge Planning: Goal: Ability to manage health-related needs will improve 06/14/2024 0415 by Pola Bridegroom, RN Outcome: Progressing 06/14/2024 0333 by Pola Bridegroom, RN Outcome: Progressing   Problem: Clinical Measurements: Goal: Ability to maintain clinical measurements within normal limits will improve 06/14/2024 0415 by Pola Bridegroom, RN Outcome: Progressing 06/14/2024 0333 by Pola Bridegroom, RN Outcome: Progressing Goal: Will remain free from infection 06/14/2024 0415 by Pola Bridegroom, RN Outcome: Progressing 06/14/2024 0333 by Pola Bridegroom, RN Outcome: Progressing Goal: Diagnostic test results will improve 06/14/2024 0415 by Pola Bridegroom, RN Outcome: Progressing 06/14/2024 0333 by Pola Bridegroom, RN Outcome: Progressing Goal: Respiratory complications will improve 06/14/2024 0415 by Pola Bridegroom, RN Outcome: Progressing 06/14/2024 0333 by Pola Bridegroom, RN Outcome: Progressing Goal: Cardiovascular complication will be avoided 06/14/2024 0415 by Pola Bridegroom, RN Outcome: Progressing 06/14/2024 0333 by Pola Bridegroom, RN Outcome: Progressing   Problem: Activity: Goal: Risk for activity intolerance will  decrease 06/14/2024 0415 by Pola Bridegroom, RN Outcome: Progressing 06/14/2024 0333 by Pola Bridegroom, RN Outcome: Progressing   Problem: Nutrition: Goal: Adequate nutrition will be maintained 06/14/2024 0415 by Pola Bridegroom, RN Outcome: Progressing 06/14/2024 0333 by Pola Bridegroom, RN Outcome: Progressing   Problem: Coping: Goal: Level of anxiety will decrease  06/14/2024 0415 by Pola Bridegroom, RN Outcome: Progressing 06/14/2024 0333 by Pola Bridegroom, RN Outcome: Progressing   Problem: Elimination: Goal: Will not experience complications related to bowel motility 06/14/2024 0415 by Pola Bridegroom, RN Outcome: Progressing 06/14/2024 0333 by Pola Bridegroom, RN Outcome: Progressing Goal: Will not experience complications related to urinary retention 06/14/2024 0415 by Pola Bridegroom, RN Outcome: Progressing 06/14/2024 0333 by Pola Bridegroom, RN Outcome: Progressing   Problem: Pain Managment: Goal: General experience of comfort will improve and/or be controlled 06/14/2024 0415 by Pola Bridegroom, RN Outcome: Progressing 06/14/2024 0333 by Pola Bridegroom, RN Outcome: Progressing   Problem: Safety: Goal: Ability to remain free from injury will improve 06/14/2024 0415 by Pola Bridegroom, RN Outcome: Progressing 06/14/2024 0333 by Pola Bridegroom, RN Outcome: Progressing   Problem: Skin Integrity: Goal: Risk for impaired skin integrity will decrease 06/14/2024 0415 by Pola Bridegroom, RN Outcome: Progressing 06/14/2024 0333 by Pola Bridegroom, RN Outcome: Progressing

## 2024-06-14 NOTE — Progress Notes (Signed)
 Mobility Specialist - Progress Note   06/14/24 1558  Mobility  Activity Pivoted/transferred to/from [facility];Mechanically lifted to/from [facility]SC;Stood at bedside  Level of Assistance +2 (takes two people)  Assistive Device None;Stedy  Distance Ambulated (ft) 2 ft  Activity Response Tolerated well  Mobility visit 1 Mobility  Mobility Specialist Start Time (ACUTE ONLY) 1528  Mobility Specialist Stop Time (ACUTE ONLY) 1543  Mobility Specialist Time Calculation (min) (ACUTE ONLY) 15 min   America Silvan Mobility Specialist 06/14/24 4:12 PM

## 2024-06-14 NOTE — Progress Notes (Signed)
 PROGRESS NOTE    Lori Wallace   FMW:[id number] DOB: 1936/04/30  DOA: 06/12/2024 Date of Service: 06/14/24 which is hospital day 1  PCP: [name], MD    Hospital course / significant events:   HPI: Lori Wallace is a 88 y.o. female with medical history significant of HTN, skin cancer, breast cancer status post bilateral lumpectomy, presented with strokelike symptoms, including recent L sided weakness, fall w/o LOC, slurred speech. Went to see PCP, suspected TIA, startes on ASA. Pt continues to have speech difficulty and swallowing difficulty, memory problems. Family brought pt to ED 08/06 08/06: hypertensive, bradycardic. Unremarkable CBC, CMP, UA. Admitted to hospitalist for TIA/CVA w/u. Imaging: CTA H/N (+)75% narrowing on the right carotid bifurcation site with signs of distal intramural thrombosis formation. Admitting hospitalist D/W oncall neurology Dr. Lindzen - he recommended carotid doppler to confirm CTA finding and permissive HTN, will see patient in AM to decide next step. MRI brain unable to complete d/t claustrophobia and motion artifact but despite these limitations MRI did reveal acute infarcts noted within the right frontal lobe involving the corona radiata and periventricular white matter 08/07: Carotid US  today, confirmed stenosis, no mention of thrombus. Vascular surgery consult. Echo EF 60-65, Grade 1 diast df.  08/08: vascular surgery consult. Await SNF placement      Consultants:  Neurology  Vascular surgery   Procedures/Surgeries: none      ASSESSMENT & PLAN:   Acute CVA  Acute left paresis, resolved Acute aphasia, resolved Acute dysphagia ASA, statin  Permissive HTN x48h PT OT and speech evaluation Consult to vascular re carotid stenosis Telemetry    CTA H/N clinically significant findings of 75% narrowing on the right carotid bifurcation site with signs of distal intramural thrombosis formation.  ASA Consult to vascular surgery re carotid  stenosis  HTN Hold atenolol  for permissive HTN, also bradycardic    Bradycardia Likely beta blocker effect Hold atenolol  (holding anyway for permissive HTN)   Fall at home Acute on chronic ambulation impairment No fracture or dislocation on left hip PT OT evaluation   Chronic normocytic anemia H&H stable, no symptoms or signs of active bleeding Monitor CBC  Depression Dementia Holding buproprion  Restart mirtazapine given insomnia Zyprexa  prn severe agitation as this tends to have less risk confusion in elderly, avoid Haldol - per neurology    No concerns based on BMI: Body mass index is 22.96 kg/m.[name] Significantly low or high BMI is associated with higher medical risk.  Underweight - under 18  overweight - 25 to 29 obese - 30 or more Class 1 obesity: BMI of 30.0 to 34 Class 2 obesity: BMI of 35.0 to 39 Class 3 obesity: BMI of 40.0 to 49 Super Morbid Obesity: BMI 50-59 Super-super Morbid Obesity: BMI 60+ Healthy nutrition and physical activity advised as adjunct to other disease management and risk reduction treatments    DVT prophylaxis: lovenox  IV fluids: holding continuous IV fluids  Nutrition: per SLP Central lines / other devices: none  Code Status: FULL CODE ACP documentation reviewed: none on file in [facility]  TOC needs: SNF rehab placement Medical barriers to dispo: pending vascular surgery eval. Expected medical readiness for discharge pending their recs / SNF placement.              Subjective / Brief ROS:  Denies CP/SOB.  Pain controlled.  Denies new weakness.  Tolerating diet.   Family Communication: daughter at bedside on rounds     Objective Findings:  Vitals:  06/14/24 0412 06/14/24 0804 06/14/24 1140 06/14/24 1607  BP: (!) 157/74 132/66 122/72 125/66  Pulse: 89 78 82 70  Resp: 18 16  17   Temp: 98 F (36.7 C) (!) 97.5 F (36.4 C) 98.1 F (36.7 C) 98.3 F (36.8 C)  TempSrc:   Oral   SpO2: 97% 98% 99% 98%  Weight:       Height:        Intake/Output Summary (Last 24 hours) at 06/14/2024 1759 Last data filed at 06/14/2024 1300 Gross per 24 hour  Intake 300 ml  Output --  Net 300 ml   Filed Weights   06/12/24 1132  Weight: 72.6 kg    Examination:  Physical Exam Constitutional:      General: She is not in acute distress. Cardiovascular:     Rate and Rhythm: Normal rate and regular rhythm.  Pulmonary:     Effort: Pulmonary effort is normal.     Breath sounds: Normal breath sounds.  Neurological:     Mental Status: She is alert.  Psychiatric:        Mood and Affect: Mood normal.        Behavior: Behavior normal.          Scheduled Medications:   aspirin  EC  81 mg Oral Daily   docusate sodium   200 mg Oral BID   enoxaparin  (LOVENOX ) injection  40 mg Subcutaneous Q24H   feeding supplement  237 mL Oral BID BM   rosuvastatin   40 mg Oral Daily   sodium chloride  flush  3 mL Intravenous Once    Continuous Infusions:   PRN Medications:  acetaminophen  **OR** acetaminophen  (TYLENOL ) oral liquid 160 mg/5 mL **OR** acetaminophen , hydrALAZINE , loratadine , LORazepam , morphine  injection, OLANZapine , senna-docusate  Antimicrobials from admission:  Anti-infectives (From admission, onward)    None           Data Reviewed:  I have personally reviewed the following...  CBC: Recent Labs  Lab 06/12/24 1136 06/14/24 0358  WBC 9.3 11.3*  NEUTROABS 5.5  --   HGB 11.9* 13.3  HCT 37.0 41.6  MCV 96.6 96.5  PLT 265 303   Basic Metabolic Panel: Recent Labs  Lab 06/12/24 1136 06/14/24 0358  NA 139 140  K 4.0 4.1  CL 104 105  CO2 27 26  GLUCOSE 94 134*  BUN 25* 37*  CREATININE 1.06* 1.22*  CALCIUM  9.2 9.4   GFR: Estimated Creatinine Clearance: 36.5 mL/min (A) (by C-G formula based on SCr of 1.22 mg/dL (H)). Liver Function Tests: Recent Labs  Lab 06/12/24 1136  AST 20  ALT 15  ALKPHOS 63  BILITOT 1.0  PROT 7.7  ALBUMIN 3.7   No results for input(s): LIPASE, AMYLASE  in the last 168 hours. No results for input(s): AMMONIA in the last 168 hours. Coagulation Profile: Recent Labs  Lab 06/12/24 1136  INR 1.0   Cardiac Enzymes: Recent Labs  Lab 06/13/24 1748  CKTOTAL 115   BNP (last 3 results) No results for input(s): PROBNP in the last 8760 hours. HbA1C: Recent Labs    06/12/24 1136  HGBA1C 5.8*   CBG: Recent Labs  Lab 06/12/24 1138  GLUCAP 102*   Lipid Profile: Recent Labs    06/13/24 0336  CHOL 208*  HDL 77  LDLCALC 109*  TRIG 109  CHOLHDL 2.7   Thyroid Function Tests: No results for input(s): TSH, T4TOTAL, FREET4, T3FREE, THYROIDAB in the last 72 hours. Anemia Panel: No results for input(s): VITAMINB12, FOLATE, FERRITIN, TIBC, IRON,  RETICCTPCT in the last 72 hours. Most Recent Urinalysis On File:     Component Value Date/Time   COLORURINE STRAW (A) 06/12/2024 1206   APPEARANCEUR CLEAR (A) 06/12/2024 1206   APPEARANCEUR Clear 09/24/2014 1532   LABSPEC 1.004 (L) 06/12/2024 1206   LABSPEC 1.004 09/24/2014 1532   PHURINE 6.0 06/12/2024 1206   GLUCOSEU NEGATIVE 06/12/2024 1206   GLUCOSEU Negative 09/24/2014 1532   HGBUR NEGATIVE 06/12/2024 1206   BILIRUBINUR NEGATIVE 06/12/2024 1206   BILIRUBINUR Negative 09/24/2014 1532   KETONESUR NEGATIVE 06/12/2024 1206   PROTEINUR NEGATIVE 06/12/2024 1206   UROBILINOGEN 0.2 10/21/2014 1259   NITRITE NEGATIVE 06/12/2024 1206   LEUKOCYTESUR NEGATIVE 06/12/2024 1206   LEUKOCYTESUR Negative 09/24/2014 1532   Sepsis Labs: @LABRCNTIP (procalcitonin:4,lacticidven:4) Microbiology: No results found for this or any previous visit (from the past 240 hours).    Radiology Studies last 3 days: US  Carotid Bilateral Result Date: 06/13/2024 CLINICAL DATA:  Thrombosis. EXAM: BILATERAL CAROTID DUPLEX ULTRASOUND TECHNIQUE: Elnor scale imaging, color Doppler and duplex ultrasound were performed of bilateral carotid and vertebral arteries in the neck. COMPARISON:  Neck CTA  06/12/2024 FINDINGS: Criteria: Quantification of carotid stenosis is based on velocity parameters that correlate the residual internal carotid diameter with NASCET-based stenosis levels, using the diameter of the distal internal carotid lumen as the denominator for stenosis measurement. The following velocity measurements were obtained: RIGHT ICA: 241 cm/sec CCA: 77 cm/sec SYSTOLIC ICA/CCA RATIO:  3.1 ECA: 259 cm/sec LEFT ICA: 160 cm/sec Bulb: 213 cm/sec CCA: 77 cm/sec SYSTOLIC ICA/CCA RATIO:  2.1 ECA: 287 cm/sec RIGHT CAROTID ARTERY: Irregular echogenic plaque with shadowing at the right carotid bulb. Elevated peak systolic velocity in the proximal external carotid artery compatible with stenosis near the origin. This finding corresponds with the recent CTA findings. Elevated peak systolic velocity in the proximal right internal carotid artery measuring 241 cm/sec. Mid and distal right internal carotid artery are patent. RIGHT VERTEBRAL ARTERY: Antegrade flow and normal waveform in the right vertebral artery. LEFT CAROTID ARTERY: Echogenic plaque at the left carotid bulb. Elevated peak systolic velocity at the left carotid bulb measuring 214 cm/sec. Elevated peak systolic velocity in the left external carotid artery. Echogenic plaque in the proximal internal carotid artery. Peak systolic velocity in the proximal internal carotid artery is 160 cm/sec. Mid and distal left internal carotid artery are patent. LEFT VERTEBRAL ARTERY: Antegrade flow and normal waveform in the left vertebral artery. IMPRESSION: 1. Atherosclerotic plaque involving bilateral carotid arteries. 2. Estimated degree of stenosis in the right internal carotid artery is greater than 70%. 3. Estimated degree of stenosis at the left carotid bulb and proximal left internal carotid artery is 50-69%. 4. Stenosis involving bilateral external carotid arteries. 5. Patent vertebral arteries bilaterally. Electronically Signed   By: Juliene Balder M.D.   On:  06/13/2024 14:41   ECHOCARDIOGRAM COMPLETE Result Date: 06/13/2024    ECHOCARDIOGRAM REPORT   Patient Name:   Lori Wallace Date of Exam: 06/12/2024 Medical Rec #:  [id number]      Height:       70.0 in Accession #:    [id number]     Weight:       160.0 lb Date of Birth:  10-Jan-1936       BSA:          1.898 m Patient Age:    87 years       BP:           113/63 mmHg Patient Gender: F  HR:           60 bpm. Exam Location:  [facility] Procedure: 2D Echo, Cardiac Doppler and Color Doppler (Both Spectral and Color            Flow Doppler were utilized during procedure). Indications:     G45.9 TIA  History:         Patient has no prior history of Echocardiogram examinations.                  Risk Factors:Hypertension.  Sonographer:     Carl Coma RDCS Referring Phys:  [id number] CORT ONEIDA MANA Diagnosing Phys: Evalene Lunger MD IMPRESSIONS  1. Left ventricular ejection fraction, by estimation, is 60 to 65%. The left ventricle has normal function. The left ventricle has no regional wall motion abnormalities. Left ventricular diastolic parameters are consistent with Grade I diastolic dysfunction (impaired relaxation).  2. Right ventricular systolic function is normal. The right ventricular size is normal. Tricuspid regurgitation signal is inadequate for assessing PA pressure.  3. The mitral valve is normal in structure. No evidence of mitral valve regurgitation. No evidence of mitral stenosis.  4. The aortic valve is normal in structure. Aortic valve regurgitation is not visualized. Aortic valve sclerosis/calcification is present, without any evidence of aortic stenosis.  5. The inferior vena cava is normal in size with greater than 50% respiratory variability, suggesting right atrial pressure of 3 mmHg. FINDINGS  Left Ventricle: Left ventricular ejection fraction, by estimation, is 60 to 65%. The left ventricle has normal function. The left ventricle has no regional wall motion abnormalities. Strain was  performed and the global longitudinal strain is indeterminate. The left ventricular internal cavity size was normal in size. There is no left ventricular hypertrophy. Left ventricular diastolic parameters are consistent with Grade I diastolic dysfunction (impaired relaxation). Right Ventricle: The right ventricular size is normal. No increase in right ventricular wall thickness. Right ventricular systolic function is normal. Tricuspid regurgitation signal is inadequate for assessing PA pressure. Left Atrium: Left atrial size was normal in size. Right Atrium: Right atrial size was normal in size. Pericardium: There is no evidence of pericardial effusion. Mitral Valve: The mitral valve is normal in structure. Mild mitral annular calcification. No evidence of mitral valve regurgitation. No evidence of mitral valve stenosis. Tricuspid Valve: The tricuspid valve is normal in structure. Tricuspid valve regurgitation is mild . No evidence of tricuspid stenosis. Aortic Valve: The aortic valve is normal in structure. Aortic valve regurgitation is not visualized. Aortic valve sclerosis/calcification is present, without any evidence of aortic stenosis. Pulmonic Valve: The pulmonic valve was normal in structure. Pulmonic valve regurgitation is not visualized. No evidence of pulmonic stenosis. Aorta: The aortic root is normal in size and structure. Venous: The inferior vena cava is normal in size with greater than 50% respiratory variability, suggesting right atrial pressure of 3 mmHg. IAS/Shunts: No atrial level shunt detected by color flow Doppler. Additional Comments: 3D was performed not requiring image post processing on an independent workstation and was indeterminate.  LEFT VENTRICLE PLAX 2D LVIDd:         3.90 cm   Diastology LVIDs:         2.20 cm   LV e' medial:    5.71 cm/s LV PW:         1.00 cm   LV E/e' medial:  16.1 LV IVS:        1.00 cm   LV e' lateral:   5.33 cm/s LVOT diam:  2.00 cm   LV E/e' lateral: 17.2  LV SV:         88 LV SV Index:   46 LVOT Area:     3.14 cm  RIGHT VENTRICLE RV Basal diam:  3.20 cm RV S prime:     15.30 cm/s TAPSE (M-mode): 2.8 cm LEFT ATRIUM             Index        RIGHT ATRIUM          Index LA diam:        3.50 cm 1.84 cm/m   RA Area:     8.71 cm LA Vol (A2C):   30.3 ml 15.96 ml/m  RA Volume:   19.70 ml 10.38 ml/m LA Vol (A4C):   28.2 ml 14.85 ml/m LA Biplane Vol: 30.6 ml 16.12 ml/m  AORTIC VALVE LVOT Vmax:   120.50 cm/s LVOT Vmean:  77.750 cm/s LVOT VTI:    0.280 m  AORTA Ao Root diam: 3.30 cm Ao Asc diam:  3.40 cm MITRAL VALVE MV Area (PHT): 2.48 cm     SHUNTS MV Decel Time: 306 msec     Systemic VTI:  0.28 m MV E velocity: 91.70 cm/s   Systemic Diam: 2.00 cm MV A velocity: 129.50 cm/s MV E/A ratio:  0.71 Evalene Lunger MD Electronically signed by Evalene Lunger MD Signature Date/Time: 06/13/2024/2:14:35 PM    Final    MR BRAIN WO CONTRAST Result Date: 06/12/2024 EXAM: MRI BRAIN WITHOUT CONTRAST 06/12/2024 04:28:07 PM TECHNIQUE: Multiplanar multisequence MRI of the head/brain was performed without the administration of intravenous contrast. Best obtainable images due to patient becoming claustrophobic and refusing to continue exam. Axial and coronal DWI and ADC images were obtained. These images are noted by motion artifact. COMPARISON: Same day CTA head and neck. CLINICAL HISTORY: Transient ischemic attack (TIA). FINDINGS: BRAIN AND VENTRICLES: There are areas of acute infarct within the right frontal lobe involving the corona radiata and periventricular white matter. There is periventricular volume loss and findings suggestive of chronic microvascular ischemic changes. No midline shift. No hydrocephalus. IMPRESSION: 1. Patient unable to complete exam due to claustrophobia. Limited DWI and ADC images obtained. Motion artifact. 2. Within these limitations, acute infarcts noted within the right frontal lobe involving the corona radiata and periventricular white matter.  Electronically signed by: Donnice Mania MD 06/12/2024 05:20 PM EDT RP Workstation: [id number]   CT ANGIO HEAD NECK W WO CM Addendum Date: 06/12/2024 ~~~~ADDENDUM #1 ~~~~ EXAM: CT HEAD WITHOUT CTA HEAD AND NECK WITH AND WITHOUT 06/12/2024 04:10:00 PM TECHNIQUE: CTA of the head and neck was performed with and without the administration of intravenous contrast. Noncontrast CT of the head with reconstructed 2-D images are also provided for review. Multiplanar 2D and/or 3D reformatted images are provided for review. Automated exposure control, iterative reconstruction, and/or weight based adjustment of the mA/kV was utilized to reduce the radiation dose to as low as reasonably achievable. COMPARISON: CT head earlier same day as well as carotid ultrasound dated 07/01/2024. CLINICAL HISTORY: Stroke/TIA, assess for intracardiac shunt. Pt presents with R side weakness, aphasia that started 8/4. She did have a fall at that time and has been having L hip pain. Pt saw her PCP yesterday and was started on ASA. FINDINGS: CT HEAD: BRAIN AND VENTRICLES: No acute intracranial hemorrhage. No mass effect. No midline shift. No extra-axial fluid collection. Gray-white differentiation is maintained. No hydrocephalus. ORBITS: No acute abnormality. SINUSES AND MASTOIDS: No acute abnormality. CTA  NECK: AORTIC ARCH AND ARCH VESSELS: Mixed atherosclerotic plaque within the visualized aortic arch. Atherosclerosis involves the origin of the left subclavian artery without high-grade stenosis. CERVICAL CAROTID ARTERIES: The right carotid artery is patent from the origin to the skull base. Mixed attenuation atherosclerotic plaque at the carotid bifurcation with prominent noncalcified component extending to the carotid bulb. There is approximately 75% stenosis at the portion of the right cervical ICA within the proximal right cervical ICA. There is intraluminal soft tissue concerning for focal intraluminal prominence visualized on series 6 image  229 and on series 8 image 57. Intraluminal thrombus is associated with approximately 50% luminal narrowing. Additional mixed atherosclerotic plaque in the distal right cervical ICA without hemodynamically significant stenosis. There is bulky calcified atherosclerosis at the left carotid bifurcation resulting in approximately 60% stenosis of the origin of the left cervical ICA. CERVICAL VERTEBRAL ARTERIES: Patent vertebral arteries in the neck. LUNGS AND MEDIASTINUM: Biapical pleural parenchymal scarring. SOFT TISSUES: No acute abnormality. BONES: Degenerative changes in the visualized spine. There is fusion of the C5 and C6 vertebral bodies. CTA HEAD: ANTERIOR CIRCULATION: The intracranial internal carotid arteries are patent bilaterally. The MCAs are patent bilaterally. The ACAs are patent bilaterally. 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 POSTERIOR CIRCULATION: Diminutive appearance of the basilar artery with significant distal tapering of the basilar artery after the origin of the superior cerebellar arteries. The PCAs are patent bilaterally and are primarily supplied by the anterior circulation. OTHER: No dural venous sinus thrombosis on this non-dedicated study. IMPRESSION: 1. Mixed atherosclerotic plaque at the right carotid bifurcation resulting in approximately 75% stenosis of the right cervical ICA origin. Focal intraluminal soft tissue in the proximal right cervical ICA concerning for intraluminal thrombus with approximately 50% luminal narrowing. 2. Approximately 60% stenosis of the origin of the left cervical ICA due to calcified atherosclerosis. 3. Additional atherosclerosis as above. 4. Findings suggestive of vertebrobasilar hypoplasia. 5. Impression point #1 discussed with Dr. Laurita at 4:42PM on 06/12/24. Electronically signed by: Donnice Mania MD 06/12/2024 04:55 PM EDT RP Workstation: [id number]   Result Date: 06/12/2024 ~~~~ORIGINAL REPORT ~~~~ EXAM: CT HEAD WITHOUT  CTA HEAD AND NECK WITH AND WITHOUT 06/12/2024 04:10:00 PM TECHNIQUE: CTA of the head and neck was performed with and without the administration of intravenous contrast. Noncontrast CT of the head with reconstructed 2-D images are also provided for review. Multiplanar 2D and/or 3D reformatted images are provided for review. Automated exposure control, iterative reconstruction, and/or weight based adjustment of the mA/kV was utilized to reduce the radiation dose to as low as reasonably achievable. COMPARISON: CT head earlier same day as well as carotid ultrasound dated 07/01/2024. CLINICAL HISTORY: Stroke/TIA, assess for intracardiac shunt. Pt presents with R side weakness, aphasia that started 8/4. She did have a fall at that time and has been having L hip pain. Pt saw her PCP yesterday and was started on ASA. FINDINGS: CT HEAD: BRAIN AND VENTRICLES: No acute intracranial hemorrhage. No mass effect. No midline shift. No extra-axial fluid collection. Gray-white differentiation is maintained. No hydrocephalus. ORBITS: No acute abnormality. SINUSES AND MASTOIDS: No acute abnormality. CTA NECK: AORTIC ARCH AND ARCH VESSELS: Mixed atherosclerotic plaque within the visualized aortic arch. Atherosclerosis involves the origin of the left subclavian artery without high-grade stenosis. CERVICAL CAROTID ARTERIES: The right carotid artery is patent from the origin to the skull base. Mixed attenuation atherosclerotic plaque at the carotid bifurcation with prominent noncalcified component extending to the carotid bulb. There is approximately 75%  stenosis at the portion of the right cervical ICA within the proximal right cervical ICA. There is intraluminal soft tissue concerning for focal intraluminal prominence visualized on series 6 image 229 and on series 8 image 57. Intraluminal thrombus is associated with approximately 50% luminal narrowing. Additional mixed atherosclerotic plaque in the distal right cervical ICA without  hemodynamically significant stenosis. There is bulky calcified atherosclerosis at the left carotid bifurcation resulting in approximately 60% stenosis of the origin of the left cervical ICA. CERVICAL VERTEBRAL ARTERIES: Patent vertebral arteries in the neck. LUNGS AND MEDIASTINUM: Biapical pleural parenchymal scarring. SOFT TISSUES: No acute abnormality. BONES: Degenerative changes in the visualized spine. There is fusion of the C5 and C6 vertebral bodies. CTA HEAD: ANTERIOR CIRCULATION: The intracranial internal carotid arteries are patent bilaterally. The MCAs are patent bilaterally. The ACAs are patent bilaterally. 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 POSTERIOR CIRCULATION: Diminutive appearance of the basilar artery with significant distal tapering of the basilar artery after the origin of the superior cerebellar arteries. The PCAs are patent bilaterally and are primarily supplied by the anterior circulation. OTHER: No dural venous sinus thrombosis on this non-dedicated study. IMPRESSION: 1. Mixed atherosclerotic plaque at the right carotid bifurcation resulting in approximately 75% stenosis of the right cervical ICA origin. Focal intraluminal soft tissue in the proximal right cervical ICA concerning for intraluminal thrombus with approximately 50% luminal narrowing. 2. Approximately 60% stenosis of the origin of the left cervical ICA due to calcified atherosclerosis. 3. Additional atherosclerosis as above. 4. Findings suggestive of vertebrobasilar hypoplasia. Electronically signed by: Donnice Mania MD 06/12/2024 04:40 PM EDT RP Workstation: [id number]   DG Hip Unilat With Pelvis 2-3 Views Left Result Date: 06/12/2024 EXAM: 2 or 3 VIEW(S) XRAY OF THE PELVIS AND LEFT HIP 06/12/2024 12:45:46 PM COMPARISON: 04/25/2021 CLINICAL HISTORY: Fall. FINDINGS: JOINTS: Postoperative changes from previous left total hip arthroplasty. No signs of acute fracture or dislocation. Chronic left  superior and inferior pubic rami fractures identified. Degenerative changes noted within the right hip. 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 SOFT TISSUES: Mild osteopenia. IMPRESSION: 1. No acute fracture or dislocation of the left hip. 2. Chronic left superior and inferior pubic rami fractures. 3. Degenerative changes in the right hip. 4. 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 Electronically signed by: Waddell Calk MD 06/12/2024 02:04 PM EDT RP Workstation: [id number]   CT HEAD WO CONTRAST Result Date: 06/12/2024 EXAM: CT HEAD WITHOUT CONTRAST 06/12/2024 12:24:12 PM TECHNIQUE: CT of the head was performed without the administration of intravenous contrast. Automated exposure control, iterative reconstruction, and/or weight based adjustment of the mA/kV was utilized to reduce the radiation dose to as low as reasonably achievable. COMPARISON: 07/13/2021 CLINICAL HISTORY: Neuro deficit, acute, stroke suspected. Pt presents with R side weakness, aphasia that started 8/4. She did have a fall at that time and has been having L hip pain. Pt saw her PCP yesterday and was started on ASA. Pt continued to have progressive worsening of weakness and was having difficulty feeding herself last night. Pt is A/Ox4 during triage with slow slurred speech noted. FINDINGS: BRAIN AND VENTRICLES: No acute hemorrhage. Gray-white differentiation is preserved. No hydrocephalus. No extra-axial collection. No mass effect or midline shift. Nonspecific hypoattenuation in the periventricular and subcortical white matter, most likely representing chronic small vessel disease. Generalized parenchymal volume loss. ORBITS: No acute abnormality. SINUSES: No acute abnormality. SOFT TISSUES AND SKULL: No acute  soft tissue abnormality. No skull fracture. VASCULATURE: Atherosclerosis in the carotid siphons. IMPRESSION: 1. No acute intracranial abnormality. 2. Nonspecific  hypoattenuation in the periventricular and subcortical white matter, most likely representing chronic small vessel disease. Electronically signed by: Donnice Mania MD 06/12/2024 01:05 PM EDT RP Workstation: [id number]          Laneta Blunt, DO [facility] 06/14/2024, 5:59 PM    Dictation software may have been used to generate the above note. Typos may occur and escape review in typed/dictated notes. Please contact Dr Blunt directly for clarity if needed.  Staff may message me via secure chat in Epic  but this may not receive an immediate response,  please page me for urgent matters!  If 7PM-7AM, please contact night coverage [email or web address]

## 2024-06-14 NOTE — Progress Notes (Signed)
 Occupational Therapy Treatment Patient Details Name: Lori Wallace MRN: [id number] DOB: 1936-08-01 Today's Date: 06/14/2024   History of present illness Pt is a 88 y.o. female presented with strokelike symptoms. 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 PMH of HTN, skin cancer, breast cancer s/p bilateral lumpectomy.   OT comments  Pt is supine in bed on arrival. Resting, but easily aroused, however remained more lethargic. Pt engaged in PT/OT co-tx session to maximize pt/therapist safety and progress mobility. She did not mention pain. Pt performed bed mobility with Mod A x2, increased cueing and inability to grip bed rail with L hand to push off. Assisted to forward scoot and square hips to prepare for standing. Bed height elevated and required Mod A x2 for STS from EOB to RW with step by step cueing for sequencing of lateral side steps to [facility] with notable L lateral lean in standing and difficulty with weight shifting. Seated oral care and hair brushing performed with Min A, increased assist for fine motor tasksand overall coordination. Cues for LUE use during tasks as pt is R hand dominant. Edu niece on AROM and [facility] activities to work with pt on. Stedy used with CGAx2 for STS from EOB via pulling on stedy to transfer to recliner. Pillows positioned to prevent L lateral lean. Pt left in recliner with niece present and all needs in place and will cont to require skilled acute OT services to maximize her safety and IND to return to PLOF.       If plan is discharge home, recommend the following:  A lot of help with bathing/dressing/bathroom;Direct supervision/assist for medications management;Supervision due to cognitive status;Direct supervision/assist for financial management;Assist for transportation;Assistance with cooking/housework;Help with stairs or ramp for entrance;Two people to help with walking and/or  transfers   Equipment Recommendations  Other (comment) (defer)    Recommendations for Other Services      Precautions / Restrictions Precautions Precautions: Fall Recall of Precautions/Restrictions: Impaired Restrictions Weight Bearing Restrictions Per Provider Order: No       Mobility Bed Mobility Overal bed mobility: Needs Assistance Bed Mobility: Supine to Sit     Supine to sit: Mod assist, +2 for physical assistance, HOB elevated, Used rails     General bed mobility comments: unable to follow directions/cues, able to initiate and attempt movements, but continues with L sided weakness, unable to grip bed rail with L hand this date, assist for all trunkal elevation, BLE management and forward scooting initially    Transfers Overall transfer level: Needs assistance Equipment used: Rolling walker (2 wheels) Transfers: Sit to/from Stand, Bed to chair/wheelchair/BSC Sit to Stand: +2 physical assistance, +2 safety/equipment, Min assist, Mod assist, From elevated surface           General transfer comment: elevated bed height and able to stand with Mod A x2 from EOB with cues for hand/feet placement and RW management; lateral side steps to [facility] with Max A x2 d/t L lateral lean and assist with all weight shifting; pt able to stand at stedy with CGA x2 after cueing for hand/feet placement; stedy transfer to recliner Transfer via Lift Equipment: Stedy   Balance Overall balance assessment: Needs assistance Sitting-balance support: Feet supported Sitting balance-Leahy Scale: Poor Sitting balance - Comments: Min progressing to CGA   Standing balance support: Bilateral upper extremity supported, Reliant on assistive device for balance Standing balance-Leahy Scale: Poor Standing balance comment: Mod/Max A x2 for  maintaining static balance                           ADL either performed or assessed with clinical judgement   ADL Overall ADL's : Needs  assistance/impaired     Grooming: Oral care;Sitting;Brushing hair;Cueing for sequencing;Minimal assistance Grooming Details (indicate cue type and reason): application of toothpaste onto brush, pt able to brush with R hand teeth and hair; difficulty with L and increased time, but able to perform                                    Extremity/Trunk Assessment              Vision       Perception     Praxis     Communication Communication Communication: No apparent difficulties   Cognition Arousal: Alert Behavior During Therapy: WFL for tasks assessed/performed (general confusion)               OT - Cognition Comments: general confusion thinking she's at home, limited/no awareness of deficits                 Following commands: Impaired Following commands impaired: Follows one step commands inconsistently      Cueing   Cueing Techniques: Verbal cues  Exercises      Shoulder Instructions       General Comments continued difficulty with all mobility tasks, significant L and anterior lean when in standing, increased cues for upright posture and weight shift    Pertinent Vitals/ Pain       Pain Assessment Pain Assessment: No/denies pain  Home Living                                          Prior Functioning/Environment              Frequency  Min 2X/week        Progress Toward Goals  OT Goals(current goals can now be found in the care plan section)  Progress towards OT goals: Progressing toward goals  Acute Rehab OT Goals Patient Stated Goal: improve function OT Goal Formulation: With patient/family Time For Goal Achievement: 06/27/24 Potential to Achieve Goals: Fair  Plan      Co-evaluation    PT/OT/SLP Co-Evaluation/Treatment: Yes Reason for Co-Treatment: For patient/therapist safety;To address functional/ADL transfers PT goals addressed during session: Mobility/safety with mobility OT goals  addressed during session: ADL's and self-care      AM-PAC OT 6 Clicks Daily Activity     Outcome Measure   Help from another person eating meals?: A Little Help from another person taking care of personal grooming?: A Little Help from another person toileting, which includes using toliet, bedpan, or urinal?: A Lot Help from another person bathing (including washing, rinsing, drying)?: A Lot Help from another person to put on and taking off regular upper body clothing?: A Lot Help from another person to put on and taking off regular lower body clothing?: A Lot 6 Click Score: 14    End of Session Equipment Utilized During Treatment: Gait belt;Rolling walker (2 wheels)  OT Visit Diagnosis: Other abnormalities of gait and mobility (R26.89);Unsteadiness on feet (R26.81);Muscle weakness (generalized) (M62.81)   Activity Tolerance Patient tolerated treatment well  Patient Left with family/visitor present;in chair;with call bell/phone within reach;with chair alarm set   Nurse Communication Mobility status        Time: 8898-8872 OT Time Calculation (min): 26 min  Charges: OT General Charges $OT Visit: 1 Visit OT Treatments $Self Care/Home Management : 8-22 mins  Ezekiah Massie, OTR/L  06/14/24, 1:06 PM   Jerrine Urschel E Alonnie Bieker 06/14/2024, 1:00 PM

## 2024-06-14 NOTE — Consult Note (Signed)
 Hospital Consult    Reason for Consult:  Right Carotid Stenosis Requesting Physician:  Dr Laneta Blunt MD MRN #:  [id number]  History of Present Illness: This is a 88 y.o. female  with medical history significant of HTN, skin cancer, breast cancer status post bilateral lumpectomy, presented with strokelike symptoms, including recent L sided weakness, fall w/o LOC, slurred speech. Went to see PCP, suspected TIA, startes on ASA. Pt continues to have speech difficulty and swallowing difficulty, memory problems. Family brought pt to ED 08/06   Admitted to hospitalist for TIA/CVA w/u. Imaging: CTA H/N (+)75% narrowing on the right carotid bifurcation site with signs of distal intramural thrombosis formation.  MRI brain unable to complete d/t claustrophobia and motion artifact but despite these limitations MRI did reveal acute infarcts noted within the right frontal lobe involving the corona radiata and periventricular white matter. On 06/13/24 Carotid US , confirmed stenosis, no mention of thrombus. Vascular Surgery consulted for evaluation.   Past Medical History:  Diagnosis Date   Actinic keratosis    Arthritis    Breast cancer (HCC) [phone]   Cancer [facility])    breast s/p bilateral lumpectomies with radiation,   Complication of anesthesia    Pt stated her top lip was puffed up and felt strange for weeks after having hip surgery. Pt thinks intubation appliance may have caused it   Constipation due to pain medication    Environmental and seasonal allergies    Frequency of urination    Hx of basal cell carcinoma 08/19/2010   Mid dorsum nose supra tip   Hypertension    pt has not had any blood pressure medications in over 4 years   Malignant neoplasm of upper-outer quadrant of female breast (HCC) 1995   [place]  wide excision, reexcision, whole breast radiation.   Malignant neoplasm of upper-outer quadrant of female breast (HCC) 11/2010   Left:Invasive ductal carcinoma ER 90%, PR  90%, HER-2/neu not over dressing. T1a   Malignant neoplasm of upper-outer quadrant of female breast (HCC) 2002   Right breast   Squamous cell carcinoma of skin 11/23/2007   Right lateral upper eyelid. SCCis   Squamous cell carcinoma of skin 09/03/2019   Right lateral pretibia. EDC.    Squamous cell carcinoma of skin 03/30/2020   R lateral pretibia    Squamous cell carcinoma of skin 03/09/2022   R upper arm, EDC   Trigger finger of both hands     Past Surgical History:  Procedure Laterality Date   BACK SURGERY  1960s   lumbar ruptured discs   BREAST BIOPSY     BREAST LUMPECTOMY Left 1995 with Radiation   BREAST LUMPECTOMY Right 2002   with Radiation   BREAST LUMPECTOMY Left 2011   BREAST SURGERY Bilateral    lumpectomies x 3   COLONOSCOPY  2006   Dr. Viktoria   EYE SURGERY  1990s   bilat cataract removal   FRACTURE SURGERY     L wrist repair, R lower leg   JOINT REPLACEMENT     R knee w/prior arhtroscopy   KYPHOPLASTY N/A 10/23/2014   Procedure: KYPHOPLASTY;  Surgeon: Oneil Rodgers Priestly, MD;  Location: [facility] OR;  Service: Orthopedics;  Laterality: N/A;  T 12 kyphoplasty   TOTAL HIP ARTHROPLASTY  07/30/2012   Procedure: TOTAL HIP ARTHROPLASTY;  Surgeon: Dempsey JINNY Sensor, MD;  Location: [facility] OR;  Service: Orthopedics;  Laterality: Left;   uterine tumor excision     benign   WRIST SURGERY Left  Allergies  Allergen Reactions   Carrot [Daucus Carota] Itching    States theres a list of 15 foods that make her itch, but she eats anyway   Demerol [Meperidine] Nausea And Vomiting   Peanut-Containing Drug Products Itching   Propoxyphene Nausea And Vomiting   Tape Itching    Prior to Admission medications   Medication Sig Start Date End Date Taking? Authorizing Provider  atenolol  (TENORMIN ) 25 MG tablet Take 25 mg by mouth daily. 04/19/21  Yes [provider]  buPROPion (WELLBUTRIN XL) 150 MG 24 hr tablet Take 150 mg by mouth daily.   Yes [provider]   Calcium -Magnesium -Vitamin D  (CALCIUM  MAGNESIUM  PO) Take 1 tablet by mouth daily.   Yes [provider]  cholecalciferol  (VITAMIN D ) 1000 UNITS tablet Take 1,000 Units by mouth daily.   Yes [provider]  ibuprofen  (ADVIL ) 200 MG tablet Take 200 mg by mouth as needed.   Yes [provider]  loratadine  (CLARITIN ) 10 MG tablet Take 10 mg by mouth daily as needed for allergies.   Yes [provider]  mirtazapine (REMERON) 15 MG tablet Take 15 mg by mouth at bedtime.   Yes [provider]  Polyvinyl Alcohol -Povidone (REFRESH OP) Place 1 drop into both eyes as needed (dry eyes).    Yes [provider]  sodium chloride  (OCEAN) 0.65 % SOLN nasal spray Place 1 spray into both nostrils as needed for congestion.   Yes [provider]  Zinc  50 MG TABS Take 50 mg by mouth daily.   Yes [provider]  Probiotic Product (PROBIOTIC DAILY PO) Take 1 tablet by mouth daily. Patient not taking: Reported on 04/25/2021    [provider]    Social History   Socioeconomic History   Marital status: Single    Spouse name: Not on file   Number of children: Not on file   Years of education: Not on file   Highest education level: Not on file  Occupational History   Not on file  Tobacco Use   Smoking status: Former   Smokeless tobacco: Never  Vaping Use   Vaping status: Never Used  Substance and Sexual Activity   Alcohol  use: No   Drug use: No   Sexual activity: Not on file  Other Topics Concern   Not on file  Social History Narrative   Not on file   Social Drivers of Health   Financial Resource Strain: Low Risk  (09/28/2023)   Received from [facility]   Overall Financial Resource Strain (CARDIA)    Difficulty of Paying Living Expenses: Not hard at all  Food Insecurity: No Food Insecurity (06/12/2024)   Hunger Vital Sign    Worried About Running Out of Food in the Last Year: Never true    Ran Out of  Food in the Last Year: Never true  Transportation Needs: No Transportation Needs (06/12/2024)   PRAPARE - Administrator, Civil Service (Medical): No    Lack of Transportation (Non-Medical): No  Physical Activity: Not on file  Stress: Not on file  Social Connections: Unknown (06/12/2024)   Social Connection and Isolation Panel    Frequency of Communication with Friends and Family: Once a week    Frequency of Social Gatherings with Friends and Family: Once a week    Attends Religious Services: Not on Insurance claims handler of Clubs or Organizations: No    Attends Banker Meetings: Never  Marital Status: Patient declined  Intimate Partner Violence: Not At Risk (06/12/2024)   Humiliation, Afraid, Rape, and Kick questionnaire    Fear of Current or Ex-Partner: No    Emotionally Abused: No    Physically Abused: No    Sexually Abused: No     Family History  Problem Relation Age of Onset   Cancer Father    Cancer Sister    Stroke Sister    Breast cancer Neg Hx     ROS: Otherwise negative unless mentioned in HPI  Physical Examination  Vitals:   06/14/24 0412 06/14/24 0804  BP: (!) 157/74 132/66  Pulse: 89 78  Resp: 18 16  Temp: 98 F (36.7 C) (!) 97.5 F (36.4 C)  SpO2: 97% 98%   Body mass index is 22.96 kg/m.  General:  WDWN in NAD Gait: Not observed HENT: WNL, normocephalic Pulmonary: normal non-labored breathing, without Rales, rhonchi,  wheezing Cardiac: regular, without  Murmurs, rubs or gallops; without carotid bruits Abdomen: Positive bowel sounds, soft, NT/ND, no masses Skin: without rashes Vascular Exam/Pulses: Palpable pulses throughout, all extremities warm to the touch. Extremities: without ischemic changes, without Gangrene , without cellulitis; without open wounds;  Musculoskeletal: no muscle wasting or atrophy  Neurologic: A&O X 3;  No focal weakness or paresthesias are detected; speech is fluent/normal Psychiatric:  The pt has  Normal affect. Lymph:  Unremarkable  CBC    Component Value Date/Time   WBC 11.3 (H) 06/14/2024 0358   RBC 4.31 06/14/2024 0358   HGB 13.3 06/14/2024 0358   HGB 13.8 09/24/2014 1633   HCT 41.6 06/14/2024 0358   HCT 41.7 09/24/2014 1633   PLT 303 06/14/2024 0358   PLT 281 09/24/2014 1633   MCV 96.5 06/14/2024 0358   MCV 95 09/24/2014 1633   MCH 30.9 06/14/2024 0358   MCHC 32.0 06/14/2024 0358   RDW 13.4 06/14/2024 0358   RDW 12.8 09/24/2014 1633   LYMPHSABS 2.1 06/12/2024 1136   LYMPHSABS 2.1 09/24/2014 1633   MONOABS 1.0 06/12/2024 1136   MONOABS 0.7 09/24/2014 1633   EOSABS 0.6 (H) 06/12/2024 1136   EOSABS 0.2 09/24/2014 1633   BASOSABS 0.1 06/12/2024 1136   BASOSABS 0.1 09/24/2014 1633    BMET    Component Value Date/Time   NA 140 06/14/2024 0358   NA 138 09/24/2014 1633   K 4.1 06/14/2024 0358   K 4.2 09/24/2014 1633   CL 105 06/14/2024 0358   CL 102 09/24/2014 1633   CO2 26 06/14/2024 0358   CO2 31 09/24/2014 1633   GLUCOSE 134 (H) 06/14/2024 0358   GLUCOSE 107 (H) 09/24/2014 1633   BUN 37 (H) 06/14/2024 0358   BUN 13 09/24/2014 1633   CREATININE 1.22 (H) 06/14/2024 0358   CREATININE 0.90 09/24/2014 1633   CALCIUM  9.4 06/14/2024 0358   CALCIUM  8.9 09/24/2014 1633   GFRNONAA 43 (L) 06/14/2024 0358   GFRNONAA >60 09/24/2014 1633   GFRAA >90 10/21/2014 1300   GFRAA >60 09/24/2014 1633    COAGS: Lab Results  Component Value Date   INR 1.0 06/12/2024   INR 1.03 10/21/2014   INR 1.01 07/25/2012     Non-Invasive Vascular Imaging:   EXAM: CT HEAD WITHOUT CONTRAST 06/12/2024 12:24:12 PM   TECHNIQUE: CT of the head was performed without the administration of intravenous contrast. Automated exposure control, iterative reconstruction, and/or weight based adjustment of the mA/kV was utilized to reduce the radiation dose to as low as reasonably achievable.   COMPARISON:  07/13/2021   CLINICAL HISTORY: Neuro deficit, acute, stroke suspected. Pt  presents with R side weakness, aphasia that started 8/4. She did have a fall at that time and has been having L hip pain. Pt saw her PCP yesterday and was started on ASA. Pt continued to have progressive worsening of weakness and was having difficulty feeding herself last night. Pt is A/Ox4 during triage with slow slurred speech noted.   FINDINGS:   BRAIN AND VENTRICLES: No acute hemorrhage. Gray-white differentiation is preserved. No hydrocephalus. No extra-axial collection. No mass effect or midline shift. Nonspecific hypoattenuation in the periventricular and subcortical white matter, most likely representing chronic small vessel disease. Generalized parenchymal volume loss.   ORBITS: No acute abnormality.   SINUSES: No acute abnormality.   SOFT TISSUES AND SKULL: No acute soft tissue abnormality. No skull fracture.   VASCULATURE: Atherosclerosis in the carotid siphons.   IMPRESSION: 1. No acute intracranial abnormality. 2. Nonspecific hypoattenuation in the periventricular and subcortical white matter, most likely representing chronic small vessel disease.  EXAM: MRI BRAIN WITHOUT CONTRAST 06/12/2024 04:28:07 PM   TECHNIQUE: Multiplanar multisequence MRI of the head/brain was performed without the administration of intravenous contrast. Best obtainable images due to patient becoming claustrophobic and refusing to continue exam. Axial and coronal DWI and ADC images were obtained. These images are noted by motion artifact.   COMPARISON: Same day CTA head and neck.   CLINICAL HISTORY: Transient ischemic attack (TIA).   FINDINGS:   BRAIN AND VENTRICLES: There are areas of acute infarct within the right frontal lobe involving the corona radiata and periventricular white matter. There is periventricular volume loss and findings suggestive of chronic microvascular ischemic changes. No midline shift. No hydrocephalus.   IMPRESSION: 1. Patient unable to complete  exam due to claustrophobia. Limited DWI and ADC images obtained. Motion artifact. 2. Within these limitations, acute infarcts noted within the right frontal lobe involving the corona radiata and periventricular white matter.   EXAM: CT HEAD WITHOUT CTA HEAD AND NECK WITH AND WITHOUT 06/12/2024 04:10:00 PM   TECHNIQUE: CTA of the head and neck was performed with and without the administration of intravenous contrast. Noncontrast CT of the head with reconstructed 2-D images are also provided for review. Multiplanar 2D and/or 3D reformatted images are provided for review. Automated exposure control, iterative reconstruction, and/or weight based adjustment of the mA/kV was utilized to reduce the radiation dose to as low as reasonably achievable.   COMPARISON: CT head earlier same day as well as carotid ultrasound dated 07/01/2024.   CLINICAL HISTORY: Stroke/TIA, assess for intracardiac shunt. Pt presents with R side weakness, aphasia that started 8/4. She did have a fall at that time and has been having L hip pain. Pt saw her PCP yesterday and was started on ASA.   FINDINGS:   CT HEAD:   BRAIN AND VENTRICLES: No acute intracranial hemorrhage. No mass effect. No midline shift. No extra-axial fluid collection. Gray-white differentiation is maintained. No hydrocephalus.   ORBITS: No acute abnormality.   SINUSES AND MASTOIDS: No acute abnormality.   CTA NECK:   AORTIC ARCH AND ARCH VESSELS: Mixed atherosclerotic plaque within the visualized aortic arch. Atherosclerosis involves the origin of the left subclavian artery without high-grade stenosis.   CERVICAL CAROTID ARTERIES: The right carotid artery is patent from the origin to the skull base. Mixed attenuation atherosclerotic plaque at the carotid bifurcation with prominent noncalcified component extending to the carotid bulb. There is approximately 75% stenosis at the portion of the  right cervical ICA within the proximal  right cervical ICA. There is intraluminal soft tissue concerning for focal intraluminal prominence visualized on series 6 image 229 and on series 8 image 57. Intraluminal thrombus is associated with approximately 50% luminal narrowing. Additional mixed atherosclerotic plaque in the distal right cervical ICA without hemodynamically significant stenosis. There is bulky calcified atherosclerosis at the left carotid bifurcation resulting in approximately 60% stenosis of the origin of the left cervical ICA.   CERVICAL VERTEBRAL ARTERIES: Patent vertebral arteries in the neck.   LUNGS AND MEDIASTINUM: Biapical pleural parenchymal scarring.   SOFT TISSUES: No acute abnormality.   BONES: Degenerative changes in the visualized spine. There is fusion of the C5 and C6 vertebral bodies.   CTA HEAD:   ANTERIOR CIRCULATION: The intracranial internal carotid arteries are patent bilaterally. The MCAs are patent bilaterally. The ACAs are patent bilaterally. 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   POSTERIOR CIRCULATION: Diminutive appearance of the basilar artery with significant distal tapering of the basilar artery after the origin of the superior cerebellar arteries. The PCAs are patent bilaterally and are primarily supplied by the anterior circulation.   OTHER: No dural venous sinus thrombosis on this non-dedicated study.   IMPRESSION: 1. Mixed atherosclerotic plaque at the right carotid bifurcation resulting in approximately 75% stenosis of the right cervical ICA origin. Focal intraluminal soft tissue in the proximal right cervical ICA concerning for intraluminal thrombus with approximately 50% luminal narrowing. 2. Approximately 60% stenosis of the origin of the left cervical ICA due to calcified atherosclerosis. 3. Additional atherosclerosis as above. 4. Findings suggestive of vertebrobasilar hypoplasia. 5. Impression point #1 discussed with Dr.  Laurita at 4:42PM on 06/12/24.  Statin:  No. Beta Blocker:  Yes.   Aspirin :  No. ACEI:  No. ARB:  No. CCB use:  No Other antiplatelets/anticoagulants:  No.    ASSESSMENT/PLAN: This is a 88 y.o. female who presented to [facility] emergency department on 06/12/2024 after the family felt she was having strokelike symptoms including left-sided weakness, falls, slurred speech.  She had presented to her PCP who suspected a TIA and started her on aspirin .  Patient continues to have difficulty with speech, swallowing, and memory.  Upon workup patient was noted to have per her MRI acute infarcts within the right frontal lobe involving the corona radiata area and periventricular white matter.  She was also noted to have on CTA of the head and neck atherosclerotic plaque at the right carotid bifurcation resulting in 75% stenosis of the right cervical ICA origin.  She was also noted to have an intraluminal thrombus with approximately 50% luminal narrowing.  She was also noted to have approximately 60% stenosis of the origin of the left cervical ICA due to calcified atherosclerosis.  I had a long detailed conversation with the patient and her niece at the bedside this afternoon.  Her niece is married to the patient's nephew who is her power of attorney.  She endorses the patient a couple of years ago after treatment for breast cancer has filled out paperwork stating if her condition were to be as it is today that she would prefer medical treatment only such as antibiotics or pain medicine.  Therefore the discussion we had today concerning her right carotid open endarterectomy did not fall within the patient's guidelines.  However we did discuss the surgical treatment of open carotid endarterectomy versus endovascular stent placement and why she would be a better candidate for open carotid endarterectomy.  We discussed the risks the benefits and complications of each procedure.  They were receptive to the conversation but  her niece is going to have a conversation with her husband the power of attorney and other family members concerning what path to take going forward.  She believes there are 3 options which would be endovascular stent placement which is high risk, open right carotid endarterectomy, and medical treatment only.  After my conversation with them today they would consider all options and get back to Dr. Laneta Blunt about their decision going forward sometime tomorrow.  Vascular surgery will continue to monitor and wait to hear from the hospitalist team caring for the patient about their decision going forward.  If the patient chooses to have an open right carotid endarterectomy which we believe would be her best option at this time this would not be available to her until the end of next week or the beginning of the following week due to her having acute infarcts which would place her at high risk for hemorrhagic stroke.  The family is aware of this and will take this into consideration.   -I have reviewed the scans in detail and discussed the case with Dr. Selinda Gu MD and he is in agreement with the plan   Gwendlyn JONELLE Shank [facility] 06/14/2024 10:42 AM

## 2024-06-14 NOTE — Plan of Care (Signed)

## 2024-06-14 NOTE — Progress Notes (Signed)
 Mobility Specialist - Progress Note   06/14/24 1011  Mobility  Activity Pivoted/transferred to/from BSC  Level of Assistance Maximum assist, patient does 25-49%  Assistive Device None  Distance Ambulated (ft) 2 ft  Activity Response Tolerated well  Mobility visit 1 Mobility  Mobility Specialist Start Time (ACUTE ONLY) 1008  Mobility Specialist Stop Time (ACUTE ONLY) 1011  Mobility Specialist Time Calculation (min) (ACUTE ONLY) 3 min   Pt sitting EOB upon entry, utilizing RA. Pt requesting to transfer to the [facility]. Post lean noted while seated EOB, CGA for support. Pt STS from EOB MaxA +1 and transferred to the [facility] via SPT HHA-- MaxA for transfer. Pt left seated on the [facility] with family member and NT present at bedside.  America Silvan Mobility Specialist 06/14/24 10:14 AM

## 2024-06-14 NOTE — NC FL2 (Signed)
 [place]  MEDICAID FL2 LEVEL OF CARE FORM     IDENTIFICATION  Patient Name: Lori Wallace Birthdate: 07-14-1936 Sex: female Admission Date (Current Location): 06/12/2024  [facility] and [place] Number:  Chiropodist and Address:  [facility], [address] [place], [place] [address]      Provider Number: [id number]  Attending Physician Name and Address:  Marsa Edelman, DO  Relative Name and Phone Number:  [name] Molt [phone]    Current Level of Care: Hospital Recommended Level of Care: Skilled Nursing Facility Prior Approval Number:    Date Approved/Denied:   PASRR Number: [id number]  Discharge Plan: Home    Current Diagnoses: Patient Active Problem List   Diagnosis Date Noted   CVA (cerebral vascular accident) (HCC) 06/13/2024   TIA (transient ischemic attack) 06/12/2024   Cervical spine fracture (HCC) 04/25/2021   Fall at home, initial encounter 04/25/2021   Fracture of pubic ramus, left, closed, initial encounter (HCC) 04/25/2021   Normocytic anemia 04/25/2021   Closed fracture of ramus of left pubis (HCC)    Malignant neoplasm of breast (HCC) 01/06/2016   Left breast mass 01/06/2016   Essential hypertension, benign 01/06/2016   Compression fracture 10/23/2014   Breast cancer (HCC) 12/03/2013   Osteoarthritis of left hip 08/01/2012    Orientation RESPIRATION BLADDER Height & Weight     Self, Time, Situation, Place    Continent Weight: 72.6 kg Height:  5' 10 (177.8 cm)  BEHAVIORAL SYMPTOMS/MOOD NEUROLOGICAL BOWEL NUTRITION STATUS      Continent Diet (2 Gram NA)  AMBULATORY STATUS COMMUNICATION OF NEEDS Skin   Limited Assist Verbally                         Personal Care Assistance Level of Assistance  Bathing, Feeding, Dressing   Feeding assistance: Limited assistance Dressing Assistance: Limited assistance     Functional Limitations Info             SPECIAL CARE FACTORS FREQUENCY  PT (By  licensed PT), OT (By licensed OT)     PT Frequency: 5  x week OT Frequency: 5 x week            Contractures      Additional Factors Info  Code Status, Allergies Code Status Info: FULL Allergies Info: Carrot (daucus Carota), Demerol (meperidine), Peanut-containing Drug Products,  Propoxyphene,  Tape           Current Medications (06/14/2024):  This is the current hospital active medication list Current Facility-Administered Medications  Medication Dose Route Frequency Provider Last Rate Last Admin   acetaminophen  (TYLENOL ) tablet 650 mg  650 mg Oral Q4H PRN Laurita Manor T, MD   650 mg at 06/12/24 1723   Or   acetaminophen  (TYLENOL ) 160 MG/5ML solution 650 mg  650 mg Per Tube Q4H PRN Laurita Manor T, MD       Or   acetaminophen  (TYLENOL ) suppository 650 mg  650 mg Rectal Q4H PRN Laurita Manor DASEN, MD       aspirin  EC tablet 81 mg  81 mg Oral Daily Alexander, Natalie, DO   81 mg at 06/14/24 9078   docusate sodium  (COLACE) capsule 200 mg  200 mg Oral BID Laurita Manor T, MD   200 mg at 06/14/24 9078   enoxaparin  (LOVENOX ) injection 40 mg  40 mg Subcutaneous Q24H Laurita Manor T, MD   40 mg at 06/13/24 2132   feeding supplement (ENSURE PLUS  HIGH PROTEIN) liquid 237 mL  237 mL Oral BID BM Zhang, Ping T, MD   237 mL at 06/14/24 1542   hydrALAZINE  (APRESOLINE ) injection 5 mg  5 mg Intravenous Q6H PRN Laurita Manor T, MD       loratadine  (CLARITIN ) tablet 10 mg  10 mg Oral Daily PRN Laurita Manor T, MD       LORazepam  (ATIVAN ) tablet 0.5 mg  0.5 mg Oral Q6H PRN Laurita Manor T, MD   0.5 mg at 06/14/24 0314   morphine  (PF) 2 MG/ML injection 2 mg  2 mg Intravenous Q4H PRN Mansy, Jan A, MD   2 mg at 06/13/24 0558   OLANZapine  (ZYPREXA ) injection 5 mg  5 mg Intramuscular Q6H PRN Laurita Manor T, MD   5 mg at 06/13/24 2136   rosuvastatin  (CRESTOR ) tablet 40 mg  40 mg Oral Daily Alexander, Natalie, DO   40 mg at 06/14/24 1541   senna-docusate (Senokot-S) tablet 1 tablet  1 tablet Oral QHS PRN Laurita Manor DASEN, MD        sodium chloride  flush (NS) 0.9 % injection 3 mL  3 mL Intravenous Once Viviann Pastor, MD         Discharge Medications: Please see discharge summary for a list of discharge medications.  Relevant Imaging Results:  Relevant Lab Results:   Additional Information SS# [id number]  [name], RN

## 2024-06-14 NOTE — Progress Notes (Signed)
 Physical Therapy Treatment Patient Details Name: Lori Wallace MRN: [id number] DOB: 08-Mar-1936 Today's Date: 06/14/2024   History of Present Illness Pt is a 88 y.o. female presented with strokelike symptoms. 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 PMH of HTN, skin cancer, breast cancer s/p bilateral lumpectomy.    PT Comments  Pt showed good effort today and was better able to follow some cuing.  She still tends to lean L in sitting and standing but was better, overall, in this regard today.  She was able to do a few side steps along EOB (lifting L foot, heel-toe R foot during L WBing).  Pt needed a lot of assist to keep upright in standing with +2 assist.  Pt remains very limited and far from her baseline, but did show improved mobility and tolerance today, will benefit from continued with PT services.   If plan is discharge home, recommend the following: Two people to help with walking and/or transfers;A lot of help with bathing/dressing/bathroom;Assistance with cooking/housework;Assist for transportation;Help with stairs or ramp for entrance   Can travel by private vehicle     No  Equipment Recommendations   (TBD)    Recommendations for Other Services       Precautions / Restrictions Precautions Precautions: Fall Recall of Precautions/Restrictions: Impaired Restrictions Weight Bearing Restrictions Per Provider Order: No     Mobility  Bed Mobility Overal bed mobility: Needs Assistance Bed Mobility: Supine to Sit     Supine to sit: Mod assist, +2 for physical assistance, HOB elevated, Used rails     General bed mobility comments: Showed better effort with trying to bridge, move LEs, etc to get toward EOB, still needed considerable assist    Transfers Overall transfer level: Needs assistance Equipment used: Rolling walker (2 wheels) Transfers: Sit to/from Stand, Bed to  chair/wheelchair/BSC Sit to Stand: +2 physical assistance, +2 safety/equipment, Min assist, Mod assist, From elevated surface           General transfer comment: elevated bed height and able to stand with Mod A x2 from EOB with cues for hand/feet placement and RW management; lateral side steps to [facility] with Max A x2 d/t L lateral lean and assist with all weight shifting; pt able to stand at stedy with CGA x2 after cueing for hand/feet placement; stedy transfer to recliner Transfer via Lift Equipment: Stedy  Ambulation/Gait               General Gait Details: unable to ambulate, though she was better able to try a few small shuffling side steps along EOB with heavy cuing and direct assist (pt leaning heavy forward and to the L)   Stairs             Wheelchair Mobility     Tilt Bed    Modified Rankin (Stroke Patients Only)       Balance Overall balance assessment: Needs assistance Sitting-balance support: Feet supported Sitting balance-Leahy Scale: Poor Sitting balance - Comments: Min progressing to CGA once helped to square with EOB, still with L lean bias   Standing balance support: Bilateral upper extremity supported, Reliant on assistive device for balance Standing balance-Leahy Scale: Poor Standing balance comment: Mod/Max A x2 for maintaining static balance                            Communication Communication Communication: No apparent difficulties  Cognition Arousal: Alert Behavior During Therapy: WFL for tasks assessed/performed                           PT - Cognition Comments: confused, but less so than on eval yesterday Following commands: Impaired Following commands impaired: Follows one step commands inconsistently    Cueing Cueing Techniques: Verbal cues  Exercises      General Comments General comments (skin integrity, edema, etc.): continued difficulty with all mobility tasks, significant L and anterior lean when in  standing, increased cues for upright posture and weight shift      Pertinent Vitals/Pain Pain Assessment Pain Assessment: No/denies pain    Home Living Family/patient expects to be discharged to:: Skilled nursing facility                        Prior Function            PT Goals (current goals can now be found in the care plan section) Progress towards PT goals: Progressing toward goals    Frequency    Min 2X/week      PT Plan      Co-evaluation PT/OT/SLP Co-Evaluation/Treatment: Yes Reason for Co-Treatment: For patient/therapist safety;To address functional/ADL transfers PT goals addressed during session: Mobility/safety with mobility OT goals addressed during session: ADL's and self-care      AM-PAC PT 6 Clicks Mobility   Outcome Measure  Help needed turning from your back to your side while in a flat bed without using bedrails?: A Lot Help needed moving from lying on your back to sitting on the side of a flat bed without using bedrails?: A Lot Help needed moving to and from a bed to a chair (including a wheelchair)?: A Lot Help needed standing up from a chair using your arms (e.g., wheelchair or bedside chair)?: A Lot Help needed to walk in hospital room?: Total Help needed climbing 3-5 steps with a railing? : Total 6 Click Score: 10    End of Session Equipment Utilized During Treatment: Gait belt Activity Tolerance: Patient tolerated treatment well;Patient limited by fatigue Patient left: with call bell/phone within reach;with chair alarm set Nurse Communication: Mobility status PT Visit Diagnosis: Muscle weakness (generalized) (M62.81);Difficulty in walking, not elsewhere classified (R26.2);Other symptoms and signs involving the nervous system (R29.898);Unsteadiness on feet (R26.81)     Time: 8898-8880 PT Time Calculation (min) (ACUTE ONLY): 18 min  Charges:    $Gait Training: 8-22 mins PT General Charges $$ ACUTE PT VISIT: 1 Visit                      [name], DPT 06/14/2024, 1:17 PM

## 2024-06-14 NOTE — TOC Initial Note (Signed)
 Transition of Care [facility]) - Initial/Assessment Note    Patient Details  Name: Lori Wallace MRN: [id number] Date of Birth: 06-Jan-1936  Transition of Care [facility]) CM/SW Contact:    [name], RN Phone Number: 06/14/2024, 3:48 PM  Clinical Narrative:                  TOC spoke with the patients daughter in her room. Therapy reccomends SNF for STR. The patient's daughter prefers [employer] [facility] [employer] in that order. FL2 sent out.        Patient Goals and CMS Choice            Expected Discharge Plan and Services                                              Prior Living Arrangements/Services                       Activities of Daily Living   ADL Screening (condition at time of admission) Independently performs ADLs?: No Does the patient have a NEW difficulty with bathing/dressing/toileting/self-feeding that is expected to last >3 days?: Yes (Initiates electronic notice to provider for possible OT consult) Does the patient have a NEW difficulty with getting in/out of bed, walking, or climbing stairs that is expected to last >3 days?: Yes (Initiates electronic notice to provider for possible PT consult) Does the patient have a NEW difficulty with communication that is expected to last >3 days?: No Is the patient deaf or have difficulty hearing?: No Does the patient have difficulty seeing, even when wearing glasses/contacts?: No Does the patient have difficulty concentrating, remembering, or making decisions?: No  Permission Sought/Granted                  Emotional Assessment              Admission diagnosis:  TIA (transient ischemic attack) [G45.9] CVA (cerebral vascular accident) [facility]) [I63.9] Patient Active Problem List   Diagnosis Date Noted   CVA (cerebral vascular accident) (HCC) 06/13/2024   TIA (transient ischemic attack) 06/12/2024   Cervical spine fracture (HCC) 04/25/2021   Fall at home, initial encounter  04/25/2021   Fracture of pubic ramus, left, closed, initial encounter (HCC) 04/25/2021   Normocytic anemia 04/25/2021   Closed fracture of ramus of left pubis (HCC)    Malignant neoplasm of breast (HCC) 01/06/2016   Left breast mass 01/06/2016   Essential hypertension, benign 01/06/2016   Compression fracture 10/23/2014   Breast cancer (HCC) 12/03/2013   Osteoarthritis of left hip 08/01/2012   PCP:  [name], MD Pharmacy:   [facility] - [place], [place] - [address] [place] [address] Phone: [phone] Fax: [phone]     Social Drivers of Health (SDOH) Social History: SDOH Screenings   Food Insecurity: No Food Insecurity (06/12/2024)  Housing: Unknown (06/12/2024)  Transportation Needs: No Transportation Needs (06/12/2024)  Utilities: Not At Risk (06/12/2024)  Financial Resource Strain: Low Risk  (09/28/2023)   Received from [facility]  Social Connections: Unknown (06/12/2024)  Tobacco Use: Medium Risk (06/12/2024)   SDOH Interventions:     Readmission Risk Interventions     No data to display

## 2024-06-15 DIAGNOSIS — G459 Transient cerebral ischemic attack, unspecified: Secondary | ICD-10-CM | POA: Diagnosis not present

## 2024-06-15 MED ORDER — POLYETHYLENE GLYCOL 3350 17 G PO PACK
17.0000 g | PACK | Freq: Every day | ORAL | Status: DC
  Administered 2024-06-15 – 2024-06-18 (×6): 17 g via ORAL
  Filled 2024-06-15 (×4): qty 1

## 2024-06-15 MED ORDER — BISACODYL 10 MG RE SUPP
10.0000 mg | Freq: Every day | RECTAL | Status: DC | PRN
  Filled 2024-06-15 (×2): qty 1

## 2024-06-15 NOTE — Plan of Care (Signed)

## 2024-06-15 NOTE — Progress Notes (Signed)
 PROGRESS NOTE    Lori Wallace   FMW:[id number] DOB: 1936-03-27  DOA: 06/12/2024 Date of Service: 06/15/24 which is hospital day 2  PCP: Lori Lame, MD    Hospital course / significant events:   HPI: Lori Wallace is a 88 y.o. female with medical history significant of HTN, skin cancer, breast cancer status post bilateral lumpectomy, presented with strokelike symptoms, including recent L sided weakness, fall w/o LOC, slurred speech. Went to see PCP, suspected TIA, startes on ASA. Pt continues to have speech difficulty and swallowing difficulty, memory problems. Family brought pt to ED 08/06 08/06: hypertensive, bradycardic. Unremarkable CBC, CMP, UA. Admitted to hospitalist for TIA/CVA w/u. Imaging: CTA H/N (+)75% narrowing on the right carotid bifurcation site with signs of distal intramural thrombosis formation. Admitting hospitalist D/W oncall neurology Lori Wallace - he recommended carotid doppler to confirm CTA finding and permissive HTN, will see patient in AM to decide next step. MRI brain unable to complete d/t claustrophobia and motion artifact but despite these limitations MRI did reveal acute infarcts noted within the right frontal lobe involving the corona radiata and periventricular white matter 08/07: Carotid US  today, confirmed stenosis, no mention of thrombus. Vascular surgery consult. Echo EF 60-65, Grade 1 diast df.  08/08: vascular surgery consult - family is leaning toward nonsurgical management but is discussing further before final decision. Await SNF placement for rehab  08/09: family brings MOST form - pt has indicated previously for DNR so orders updated. Pt also indicated limited interventions, was not specific about surgeries but family is leaning toward no endarterectomy / discuss outpatient and treat medically for now.      Consultants:  Neurology  Vascular surgery   Procedures/Surgeries: none      ASSESSMENT & PLAN:   Acute CVA  Acute left  paresis, resolved Acute aphasia, resolved Acute dysphagia ASA, statin  PT OT and speech evaluation - rehab pending    CTA H/N clinically significant findings of 75% narrowing on the right carotid bifurcation site with signs of distal intramural thrombosis formation.  ASA Consult to vascular re carotid stenosis --> risk of surgery in light of pt's previously known preferences for no aggressive measures, family is leaning toward no surgery  HTN Held atenolol  for permissive HTN, also bradycardic. HR and BP stable now will adjust emds as needed    Bradycardia Likely beta blocker effect Hold atenolol   Fall at home Acute on chronic ambulation impairment No fracture or dislocation on left hip PT OT evaluation   Chronic normocytic anemia H&H stable, no symptoms or signs of active bleeding Monitor CBC  Depression Dementia Holding buproprion  Restart mirtazapine given insomnia Zyprexa  prn severe agitation as this tends to have less risk confusion in elderly, avoid Haldol - per neurology    No concerns based on BMI: Body mass index is 22.96 kg/m.Lori Wallace Significantly low or high BMI is associated with higher medical risk.  Underweight - under 18  overweight - 25 to 29 obese - 30 or more Class 1 obesity: BMI of 30.0 to 34 Class 2 obesity: BMI of 35.0 to 39 Class 3 obesity: BMI of 40.0 to 49 Super Morbid Obesity: BMI 50-59 Super-super Morbid Obesity: BMI 60+ Healthy nutrition and physical activity advised as adjunct to other disease management and risk reduction treatments    DVT prophylaxis: lovenox  IV fluids: holding continuous IV fluids  Nutrition: per SLP Central lines / other devices: none  Code Status: FULL CODE ACP documentation reviewed: none on file in  [facility]  TOC needs: SNF rehab placement Medical barriers to dispo: pending vascular surgery eval. Expected medical readiness for discharge pending their recs / SNF placement.              Subjective / Brief ROS:   Denies CP/SOB.  Pain controlled.  Denies new weakness.  Tolerating diet.   Family Communication: family at bedside on rounds     Objective Findings:  Vitals:   06/15/24 0027 06/15/24 0511 06/15/24 0801 06/15/24 1201  BP: (!) 169/73 (!) 153/81 (!) 149/71 135/70  Pulse: 80 79 77 77  Resp: 18 18 14 18   Temp: 97.9 F (36.6 C) 97.9 F (36.6 C) 97.9 F (36.6 C) 98.9 F (37.2 C)  TempSrc: Oral Oral    SpO2: 98% 94% 97% 99%  Weight:      Height:        Intake/Output Summary (Last 24 hours) at 06/15/2024 1412 Last data filed at 06/15/2024 1300 Gross per 24 hour  Intake 480 ml  Output --  Net 480 ml   Filed Weights   06/12/24 1132  Weight: 72.6 kg    Examination:  Physical Exam Constitutional:      General: She is not in acute distress. Cardiovascular:     Rate and Rhythm: Normal rate and regular rhythm.  Pulmonary:     Effort: Pulmonary effort is normal.     Breath sounds: Normal breath sounds.  Neurological:     Mental Status: She is alert.  Psychiatric:        Mood and Affect: Mood normal.        Behavior: Behavior normal.          Scheduled Medications:   aspirin  EC  81 mg Oral Daily   docusate sodium   200 mg Oral BID   enoxaparin  (LOVENOX ) injection  40 mg Subcutaneous Q24H   feeding supplement  237 mL Oral BID BM   rosuvastatin   40 mg Oral Daily   sodium chloride  flush  3 mL Intravenous Once    Continuous Infusions:   PRN Medications:  acetaminophen  **OR** acetaminophen  (TYLENOL ) oral liquid 160 mg/5 mL **OR** acetaminophen , hydrALAZINE , loratadine , LORazepam , morphine  injection, OLANZapine , senna-docusate  Antimicrobials from admission:  Anti-infectives (From admission, onward)    None           Data Reviewed:  I have personally reviewed the following...  CBC: Recent Labs  Lab 06/12/24 1136 06/14/24 0358  WBC 9.3 11.3*  NEUTROABS 5.5  --   HGB 11.9* 13.3  HCT 37.0 41.6  MCV 96.6 96.5  PLT 265 303   Basic Metabolic  Panel: Recent Labs  Lab 06/12/24 1136 06/14/24 0358  NA 139 140  K 4.0 4.1  CL 104 105  CO2 27 26  GLUCOSE 94 134*  BUN 25* 37*  CREATININE 1.06* 1.22*  CALCIUM  9.2 9.4   GFR: Estimated Creatinine Clearance: 36.5 mL/min (A) (by C-G formula based on SCr of 1.22 mg/dL (H)). Liver Function Tests: Recent Labs  Lab 06/12/24 1136  AST 20  ALT 15  ALKPHOS 63  BILITOT 1.0  PROT 7.7  ALBUMIN 3.7   No results for input(s): LIPASE, AMYLASE in the last 168 hours. No results for input(s): AMMONIA in the last 168 hours. Coagulation Profile: Recent Labs  Lab 06/12/24 1136  INR 1.0   Cardiac Enzymes: Recent Labs  Lab 06/13/24 1748  CKTOTAL 115   BNP (last 3 results) No results for input(s): PROBNP in the last 8760 hours. HbA1C: No results  for input(s): HGBA1C in the last 72 hours.  CBG: Recent Labs  Lab 06/12/24 1138  GLUCAP 102*   Lipid Profile: Recent Labs    06/13/24 0336  CHOL 208*  HDL 77  LDLCALC 109*  TRIG 109  CHOLHDL 2.7   Thyroid Function Tests: No results for input(s): TSH, T4TOTAL, FREET4, T3FREE, THYROIDAB in the last 72 hours. Anemia Panel: No results for input(s): VITAMINB12, FOLATE, FERRITIN, TIBC, IRON, RETICCTPCT in the last 72 hours. Most Recent Urinalysis On File:     Component Value Date/Time   COLORURINE STRAW (A) 06/12/2024 1206   APPEARANCEUR CLEAR (A) 06/12/2024 1206   APPEARANCEUR Clear 09/24/2014 1532   LABSPEC 1.004 (L) 06/12/2024 1206   LABSPEC 1.004 09/24/2014 1532   PHURINE 6.0 06/12/2024 1206   GLUCOSEU NEGATIVE 06/12/2024 1206   GLUCOSEU Negative 09/24/2014 1532   HGBUR NEGATIVE 06/12/2024 1206   BILIRUBINUR NEGATIVE 06/12/2024 1206   BILIRUBINUR Negative 09/24/2014 1532   KETONESUR NEGATIVE 06/12/2024 1206   PROTEINUR NEGATIVE 06/12/2024 1206   UROBILINOGEN 0.2 10/21/2014 1259   NITRITE NEGATIVE 06/12/2024 1206   LEUKOCYTESUR NEGATIVE 06/12/2024 1206   LEUKOCYTESUR Negative  09/24/2014 1532   Sepsis Labs: @LABRCNTIP (procalcitonin:4,lacticidven:4) Microbiology: No results found for this or any previous visit (from the past 240 hours).    Radiology Studies last 3 days: US  Carotid Bilateral Result Date: 06/13/2024 CLINICAL DATA:  Thrombosis. EXAM: BILATERAL CAROTID DUPLEX ULTRASOUND TECHNIQUE: Elnor scale imaging, color Doppler and duplex ultrasound were performed of bilateral carotid and vertebral arteries in the neck. COMPARISON:  Neck CTA 06/12/2024 FINDINGS: Criteria: Quantification of carotid stenosis is based on velocity parameters that correlate the residual internal carotid diameter with NASCET-based stenosis levels, using the diameter of the distal internal carotid lumen as the denominator for stenosis measurement. The following velocity measurements were obtained: RIGHT ICA: 241 cm/sec CCA: 77 cm/sec SYSTOLIC ICA/CCA RATIO:  3.1 ECA: 259 cm/sec LEFT ICA: 160 cm/sec Bulb: 213 cm/sec CCA: 77 cm/sec SYSTOLIC ICA/CCA RATIO:  2.1 ECA: 287 cm/sec RIGHT CAROTID ARTERY: Irregular echogenic plaque with shadowing at the right carotid bulb. Elevated peak systolic velocity in the proximal external carotid artery compatible with stenosis near the origin. This finding corresponds with the recent CTA findings. Elevated peak systolic velocity in the proximal right internal carotid artery measuring 241 cm/sec. Mid and distal right internal carotid artery are patent. RIGHT VERTEBRAL ARTERY: Antegrade flow and normal waveform in the right vertebral artery. LEFT CAROTID ARTERY: Echogenic plaque at the left carotid bulb. Elevated peak systolic velocity at the left carotid bulb measuring 214 cm/sec. Elevated peak systolic velocity in the left external carotid artery. Echogenic plaque in the proximal internal carotid artery. Peak systolic velocity in the proximal internal carotid artery is 160 cm/sec. Mid and distal left internal carotid artery are patent. LEFT VERTEBRAL ARTERY: Antegrade flow  and normal waveform in the left vertebral artery. IMPRESSION: 1. Atherosclerotic plaque involving bilateral carotid arteries. 2. Estimated degree of stenosis in the right internal carotid artery is greater than 70%. 3. Estimated degree of stenosis at the left carotid bulb and proximal left internal carotid artery is 50-69%. 4. Stenosis involving bilateral external carotid arteries. 5. Patent vertebral arteries bilaterally. Electronically Signed   By: Juliene Balder M.D.   On: 06/13/2024 14:41   ECHOCARDIOGRAM COMPLETE Result Date: 06/13/2024    ECHOCARDIOGRAM REPORT   Patient Name:   Lori Wallace Date of Exam: 06/12/2024 Medical Rec #:  [id number]      Height:  70.0 in Accession #:    [id number]     Weight:       160.0 lb Date of Birth:  11-Sep-1936       BSA:          1.898 m Patient Age:    87 years       BP:           113/63 mmHg Patient Gender: F              HR:           60 bpm. Exam Location:  [facility] Procedure: 2D Echo, Cardiac Doppler and Color Doppler (Both Spectral and Color            Flow Doppler were utilized during procedure). Indications:     G45.9 TIA  History:         Patient has no prior history of Echocardiogram examinations.                  Risk Factors:Hypertension.  Sonographer:     Carl Coma RDCS Referring Phys:  [id number] CORT ONEIDA MANA Diagnosing Phys: Evalene Lunger MD IMPRESSIONS  1. Left ventricular ejection fraction, by estimation, is 60 to 65%. The left ventricle has normal function. The left ventricle has no regional wall motion abnormalities. Left ventricular diastolic parameters are consistent with Grade I diastolic dysfunction (impaired relaxation).  2. Right ventricular systolic function is normal. The right ventricular size is normal. Tricuspid regurgitation signal is inadequate for assessing PA pressure.  3. The mitral valve is normal in structure. No evidence of mitral valve regurgitation. No evidence of mitral stenosis.  4. The aortic valve is normal in structure.  Aortic valve regurgitation is not visualized. Aortic valve sclerosis/calcification is present, without any evidence of aortic stenosis.  5. The inferior vena cava is normal in size with greater than 50% respiratory variability, suggesting right atrial pressure of 3 mmHg. FINDINGS  Left Ventricle: Left ventricular ejection fraction, by estimation, is 60 to 65%. The left ventricle has normal function. The left ventricle has no regional wall motion abnormalities. Strain was performed and the global longitudinal strain is indeterminate. The left ventricular internal cavity size was normal in size. There is no left ventricular hypertrophy. Left ventricular diastolic parameters are consistent with Grade I diastolic dysfunction (impaired relaxation). Right Ventricle: The right ventricular size is normal. No increase in right ventricular wall thickness. Right ventricular systolic function is normal. Tricuspid regurgitation signal is inadequate for assessing PA pressure. Left Atrium: Left atrial size was normal in size. Right Atrium: Right atrial size was normal in size. Pericardium: There is no evidence of pericardial effusion. Mitral Valve: The mitral valve is normal in structure. Mild mitral annular calcification. No evidence of mitral valve regurgitation. No evidence of mitral valve stenosis. Tricuspid Valve: The tricuspid valve is normal in structure. Tricuspid valve regurgitation is mild . No evidence of tricuspid stenosis. Aortic Valve: The aortic valve is normal in structure. Aortic valve regurgitation is not visualized. Aortic valve sclerosis/calcification is present, without any evidence of aortic stenosis. Pulmonic Valve: The pulmonic valve was normal in structure. Pulmonic valve regurgitation is not visualized. No evidence of pulmonic stenosis. Aorta: The aortic root is normal in size and structure. Venous: The inferior vena cava is normal in size with greater than 50% respiratory variability, suggesting right  atrial pressure of 3 mmHg. IAS/Shunts: No atrial level shunt detected by color flow Doppler. Additional Comments: 3D was performed not requiring image post processing on  an independent workstation and was indeterminate.  LEFT VENTRICLE PLAX 2D LVIDd:         3.90 cm   Diastology LVIDs:         2.20 cm   LV e' medial:    5.71 cm/s LV PW:         1.00 cm   LV E/e' medial:  16.1 LV IVS:        1.00 cm   LV e' lateral:   5.33 cm/s LVOT diam:     2.00 cm   LV E/e' lateral: 17.2 LV SV:         88 LV SV Index:   46 LVOT Area:     3.14 cm  RIGHT VENTRICLE RV Basal diam:  3.20 cm RV S prime:     15.30 cm/s TAPSE (M-mode): 2.8 cm LEFT ATRIUM             Index        RIGHT ATRIUM          Index LA diam:        3.50 cm 1.84 cm/m   RA Area:     8.71 cm LA Vol (A2C):   30.3 ml 15.96 ml/m  RA Volume:   19.70 ml 10.38 ml/m LA Vol (A4C):   28.2 ml 14.85 ml/m LA Biplane Vol: 30.6 ml 16.12 ml/m  AORTIC VALVE LVOT Vmax:   120.50 cm/s LVOT Vmean:  77.750 cm/s LVOT VTI:    0.280 m  AORTA Ao Root diam: 3.30 cm Ao Asc diam:  3.40 cm MITRAL VALVE MV Area (PHT): 2.48 cm     SHUNTS MV Decel Time: 306 msec     Systemic VTI:  0.28 m MV E velocity: 91.70 cm/s   Systemic Diam: 2.00 cm MV A velocity: 129.50 cm/s MV E/A ratio:  0.71 Evalene Lunger MD Electronically signed by Evalene Lunger MD Signature Date/Time: 06/13/2024/2:14:35 PM    Final    MR BRAIN WO CONTRAST Result Date: 06/12/2024 EXAM: MRI BRAIN WITHOUT CONTRAST 06/12/2024 04:28:07 PM TECHNIQUE: Multiplanar multisequence MRI of the head/brain was performed without the administration of intravenous contrast. Best obtainable images due to patient becoming claustrophobic and refusing to continue exam. Axial and coronal DWI and ADC images were obtained. These images are noted by motion artifact. COMPARISON: Same day CTA head and neck. CLINICAL HISTORY: Transient ischemic attack (TIA). FINDINGS: BRAIN AND VENTRICLES: There are areas of acute infarct within the right frontal lobe  involving the corona radiata and periventricular white matter. There is periventricular volume loss and findings suggestive of chronic microvascular ischemic changes. No midline shift. No hydrocephalus. IMPRESSION: 1. Patient unable to complete exam due to claustrophobia. Limited DWI and ADC images obtained. Motion artifact. 2. Within these limitations, acute infarcts noted within the right frontal lobe involving the corona radiata and periventricular white matter. Electronically signed by: Donnice Mania MD 06/12/2024 05:20 PM EDT RP Workstation: [id number]   CT ANGIO HEAD NECK W WO CM Addendum Date: 06/12/2024 ~~~~ADDENDUM #1 ~~~~ EXAM: CT HEAD WITHOUT CTA HEAD AND NECK WITH AND WITHOUT 06/12/2024 04:10:00 PM TECHNIQUE: CTA of the head and neck was performed with and without the administration of intravenous contrast. Noncontrast CT of the head with reconstructed 2-D images are also provided for review. Multiplanar 2D and/or 3D reformatted images are provided for review. Automated exposure control, iterative reconstruction, and/or weight based adjustment of the mA/kV was utilized to reduce the radiation dose to as low as reasonably achievable. COMPARISON: CT  head earlier same day as well as carotid ultrasound dated 07/01/2024. CLINICAL HISTORY: Stroke/TIA, assess for intracardiac shunt. Pt presents with R side weakness, aphasia that started 8/4. She did have a fall at that time and has been having L hip pain. Pt saw her PCP yesterday and was started on ASA. FINDINGS: CT HEAD: BRAIN AND VENTRICLES: No acute intracranial hemorrhage. No mass effect. No midline shift. No extra-axial fluid collection. Gray-white differentiation is maintained. No hydrocephalus. ORBITS: No acute abnormality. SINUSES AND MASTOIDS: No acute abnormality. CTA NECK: AORTIC ARCH AND ARCH VESSELS: Mixed atherosclerotic plaque within the visualized aortic arch. Atherosclerosis involves the origin of the left subclavian artery without high-grade  stenosis. CERVICAL CAROTID ARTERIES: The right carotid artery is patent from the origin to the skull base. Mixed attenuation atherosclerotic plaque at the carotid bifurcation with prominent noncalcified component extending to the carotid bulb. There is approximately 75% stenosis at the portion of the right cervical ICA within the proximal right cervical ICA. There is intraluminal soft tissue concerning for focal intraluminal prominence visualized on series 6 image 229 and on series 8 image 57. Intraluminal thrombus is associated with approximately 50% luminal narrowing. Additional mixed atherosclerotic plaque in the distal right cervical ICA without hemodynamically significant stenosis. There is bulky calcified atherosclerosis at the left carotid bifurcation resulting in approximately 60% stenosis of the origin of the left cervical ICA. CERVICAL VERTEBRAL ARTERIES: Patent vertebral arteries in the neck. LUNGS AND MEDIASTINUM: Biapical pleural parenchymal scarring. SOFT TISSUES: No acute abnormality. BONES: Degenerative changes in the visualized spine. There is fusion of the C5 and C6 vertebral bodies. CTA HEAD: ANTERIOR CIRCULATION: The intracranial internal carotid arteries are patent bilaterally. The MCAs are patent bilaterally. The ACAs are patent bilaterally. 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 POSTERIOR CIRCULATION: Diminutive appearance of the basilar artery with significant distal tapering of the basilar artery after the origin of the superior cerebellar arteries. The PCAs are patent bilaterally and are primarily supplied by the anterior circulation. OTHER: No dural venous sinus thrombosis on this non-dedicated study. IMPRESSION: 1. Mixed atherosclerotic plaque at the right carotid bifurcation resulting in approximately 75% stenosis of the right cervical ICA origin. Focal intraluminal soft tissue in the proximal right cervical ICA concerning for intraluminal thrombus with  approximately 50% luminal narrowing. 2. Approximately 60% stenosis of the origin of the left cervical ICA due to calcified atherosclerosis. 3. Additional atherosclerosis as above. 4. Findings suggestive of vertebrobasilar hypoplasia. 5. Impression point #1 discussed with Dr. Laurita at 4:42PM on 06/12/24. Electronically signed by: Donnice Mania MD 06/12/2024 04:55 PM EDT RP Workstation: [id number]   Result Date: 06/12/2024 ~~~~ORIGINAL REPORT ~~~~ EXAM: CT HEAD WITHOUT CTA HEAD AND NECK WITH AND WITHOUT 06/12/2024 04:10:00 PM TECHNIQUE: CTA of the head and neck was performed with and without the administration of intravenous contrast. Noncontrast CT of the head with reconstructed 2-D images are also provided for review. Multiplanar 2D and/or 3D reformatted images are provided for review. Automated exposure control, iterative reconstruction, and/or weight based adjustment of the mA/kV was utilized to reduce the radiation dose to as low as reasonably achievable. COMPARISON: CT head earlier same day as well as carotid ultrasound dated 07/01/2024. CLINICAL HISTORY: Stroke/TIA, assess for intracardiac shunt. Pt presents with R side weakness, aphasia that started 8/4. She did have a fall at that time and has been having L hip pain. Pt saw her PCP yesterday and was started on ASA. FINDINGS: CT HEAD: BRAIN AND VENTRICLES: No acute intracranial hemorrhage.  No mass effect. No midline shift. No extra-axial fluid collection. Gray-white differentiation is maintained. No hydrocephalus. ORBITS: No acute abnormality. SINUSES AND MASTOIDS: No acute abnormality. CTA NECK: AORTIC ARCH AND ARCH VESSELS: Mixed atherosclerotic plaque within the visualized aortic arch. Atherosclerosis involves the origin of the left subclavian artery without high-grade stenosis. CERVICAL CAROTID ARTERIES: The right carotid artery is patent from the origin to the skull base. Mixed attenuation atherosclerotic plaque at the carotid bifurcation with prominent  noncalcified component extending to the carotid bulb. There is approximately 75% stenosis at the portion of the right cervical ICA within the proximal right cervical ICA. There is intraluminal soft tissue concerning for focal intraluminal prominence visualized on series 6 image 229 and on series 8 image 57. Intraluminal thrombus is associated with approximately 50% luminal narrowing. Additional mixed atherosclerotic plaque in the distal right cervical ICA without hemodynamically significant stenosis. There is bulky calcified atherosclerosis at the left carotid bifurcation resulting in approximately 60% stenosis of the origin of the left cervical ICA. CERVICAL VERTEBRAL ARTERIES: Patent vertebral arteries in the neck. LUNGS AND MEDIASTINUM: Biapical pleural parenchymal scarring. SOFT TISSUES: No acute abnormality. BONES: Degenerative changes in the visualized spine. There is fusion of the C5 and C6 vertebral bodies. CTA HEAD: ANTERIOR CIRCULATION: The intracranial internal carotid arteries are patent bilaterally. The MCAs are patent bilaterally. The ACAs are patent bilaterally. 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 POSTERIOR CIRCULATION: Diminutive appearance of the basilar artery with significant distal tapering of the basilar artery after the origin of the superior cerebellar arteries. The PCAs are patent bilaterally and are primarily supplied by the anterior circulation. OTHER: No dural venous sinus thrombosis on this non-dedicated study. IMPRESSION: 1. Mixed atherosclerotic plaque at the right carotid bifurcation resulting in approximately 75% stenosis of the right cervical ICA origin. Focal intraluminal soft tissue in the proximal right cervical ICA concerning for intraluminal thrombus with approximately 50% luminal narrowing. 2. Approximately 60% stenosis of the origin of the left cervical ICA due to calcified atherosclerosis. 3. Additional atherosclerosis as above. 4.  Findings suggestive of vertebrobasilar hypoplasia. Electronically signed by: Donnice Mania MD 06/12/2024 04:40 PM EDT RP Workstation: [id number]   DG Hip Unilat With Pelvis 2-3 Views Left Result Date: 06/12/2024 EXAM: 2 or 3 VIEW(S) XRAY OF THE PELVIS AND LEFT HIP 06/12/2024 12:45:46 PM COMPARISON: 04/25/2021 CLINICAL HISTORY: Fall. FINDINGS: JOINTS: Postoperative changes from previous left total hip arthroplasty. No signs of acute fracture or dislocation. Chronic left superior and inferior pubic rami fractures identified. Degenerative changes noted within the right hip. 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 SOFT TISSUES: Mild osteopenia. IMPRESSION: 1. No acute fracture or dislocation of the left hip. 2. Chronic left superior and inferior pubic rami fractures. 3. Degenerative changes in the right hip. 4. 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 Electronically signed by: Waddell Calk MD 06/12/2024 02:04 PM EDT RP Workstation: [id number]   CT HEAD WO CONTRAST Result Date: 06/12/2024 EXAM: CT HEAD WITHOUT CONTRAST 06/12/2024 12:24:12 PM TECHNIQUE: CT of the head was performed without the administration of intravenous contrast. Automated exposure control, iterative reconstruction, and/or weight based adjustment of the mA/kV was utilized to reduce the radiation dose to as low as reasonably achievable. COMPARISON: 07/13/2021 CLINICAL HISTORY: Neuro deficit, acute, stroke suspected. Pt presents with R side weakness, aphasia that started 8/4. She did have a fall at that time and has been having L hip pain. Pt saw  her PCP yesterday and was started on ASA. Pt continued to have progressive worsening of weakness and was having difficulty feeding herself last night. Pt is A/Ox4 during triage with slow slurred speech noted. FINDINGS: BRAIN AND VENTRICLES: No acute hemorrhage. Gray-white differentiation is preserved. No hydrocephalus. No extra-axial  collection. No mass effect or midline shift. Nonspecific hypoattenuation in the periventricular and subcortical white matter, most likely representing chronic small vessel disease. Generalized parenchymal volume loss. ORBITS: No acute abnormality. SINUSES: No acute abnormality. SOFT TISSUES AND SKULL: No acute soft tissue abnormality. No skull fracture. VASCULATURE: Atherosclerosis in the carotid siphons. IMPRESSION: 1. No acute intracranial abnormality. 2. Nonspecific hypoattenuation in the periventricular and subcortical white matter, most likely representing chronic small vessel disease. Electronically signed by: Donnice Mania MD 06/12/2024 01:05 PM EDT RP Workstation: [id number]          Laneta Blunt, DO [facility] 06/15/2024, 2:12 PM    Dictation software may have been used to generate the above note. Typos may occur and escape review in typed/dictated notes. Please contact Dr Blunt directly for clarity if needed.  Staff may message me via secure chat in Epic  but this may not receive an immediate response,  please page me for urgent matters!  If 7PM-7AM, please contact night coverage [email or web address]

## 2024-06-15 NOTE — Plan of Care (Signed)
 Vascular Surgery note reviewed. Patient and family are considering CEA but may also opt for medical management only. If they decide not to have CEA, then add Plavix  to ASA and continue DAPT indefinitely.   Neurohospitalist service will sign off. Please call if there are additional questions.   Electronically signed: Dr. Camellia Shark'

## 2024-06-15 NOTE — Progress Notes (Signed)
 Speech Language Pathology Treatment: Cognitive-Linguistic  Patient Details Name: Lori Wallace MRN: [id number] DOB: July 07, 1936 Today's Date: 06/15/2024 Time: 8899-8874 SLP Time Calculation (min) (ACUTE ONLY): 25 min  Assessment / Plan / Recommendation Clinical Impression  Pt seen for follow up SLP intervention, targeting further assessment and education for family. Pt drowsy during session, frequently returning to eyes closed, head back position. Min verbal cues provided for redirection of attention to task/verbal prompt. Pt demonstrating continued imprecise articulation- though intelligible. Reading and visual processing intact at phrase level. Noted aversion to challenge when presented with complex language task, unsure if the increased language demand or fatigue during session led to pt driven cessation of task. Education shared with family in room regarding engaging pt for selection of wants/needs to bolster engagement, utilizing orientation board in room to increase orientation/awareness, and balancing attempts at interaction with current level of lethargy. Olam reported understanding. Continue to recommend follow up SLP services at time of discharge. To bolster cognitive communication ability, recommend reducing distractions, one step/clear instructions, redirection, visual cues/models as needed. SLP will continue to follow.     HPI HPI: Per H&P,  Lori Wallace is a 88 y.o. female with medical history significant of HTN, skin cancer, breast cancer status post bilateral lumpectomy, presented with strokelike symptoms. 2 nieces gave history at bedside. Patient lives by herself with one of the nieces believes that has her neighbor. At baseline she is quite independent uses roller walker to ambulate and feed herself and able to bathe herself. Last weekend, patient started to have headaches for 3 days and then since Monday, patient was found to have severe weakness more on the left side, family helped  her to go to bathroom and she collapsed on her way but no loss of consciousness or head injury. Monday night, patient fell while getting out of bed first off and hit her left hip. She was able to get back to her bed and called family. Family came to her home and found she had weakness on the left side and slurred speech. Patient was seen by PCP and PCP suspect patient had a TIA and started her on aspirin . Also yesterday, family noticed the patient shoveling food in her mouth, but not swallowing, and in the evening again family noticed patient had speech problem and confusion and weakness on one side of the limbs and decided to bring her to ED. Family also reported the patient appears to have memory deficiency developed recently.      SLP Plan  Continue with current plan of care          Recommendations                    Oral care BID;Staff/trained caregiver to provide oral care   Frequent or constant Supervision/Assistance Dysarthria and anarthria (R47.1);Cognitive communication deficit (R41.841)     Continue with current plan of care    [place] Mats Jeanlouis Clapp, MS, CCC-SLP Speech Language Pathologist Rehab Services; [facility] [phone] (ascom)   [place] J Clapp  06/15/2024, 11:26 AM

## 2024-06-15 NOTE — Plan of Care (Signed)
  Problem: Education: Goal: Knowledge of disease or condition will improve Outcome: Progressing Goal: Knowledge of secondary prevention will improve (MUST DOCUMENT ALL) Outcome: Progressing Goal: Knowledge of patient specific risk factors will improve (DELETE if not current risk factor) Outcome: Progressing   Problem: Ischemic Stroke/TIA Tissue Perfusion: Goal: Complications of ischemic stroke/TIA will be minimized Outcome: Progressing   Problem: Coping: Goal: Will verbalize positive feelings about self Outcome: Progressing Goal: Will identify appropriate support needs Outcome: Progressing   Problem: Self-Care: Goal: Ability to participate in self-care as condition permits will improve Outcome: Progressing Goal: Verbalization of feelings and concerns over difficulty with self-care will improve Outcome: Progressing Goal: Ability to communicate needs accurately will improve Outcome: Progressing   Problem: Health Behavior/Discharge Planning: Goal: Ability to manage health-related needs will improve Outcome: Progressing   Problem: Clinical Measurements: Goal: Ability to maintain clinical measurements within normal limits will improve Outcome: Progressing Goal: Will remain free from infection Outcome: Progressing Goal: Diagnostic test results will improve Outcome: Progressing Goal: Respiratory complications will improve Outcome: Progressing Goal: Cardiovascular complication will be avoided Outcome: Progressing   Problem: Nutrition: Goal: Adequate nutrition will be maintained Outcome: Progressing   Problem: Coping: Goal: Level of anxiety will decrease Outcome: Progressing

## 2024-06-16 DIAGNOSIS — G459 Transient cerebral ischemic attack, unspecified: Secondary | ICD-10-CM | POA: Diagnosis not present

## 2024-06-16 MED ORDER — LORATADINE 10 MG PO TABS
10.0000 mg | ORAL_TABLET | Freq: Every day | ORAL | Status: DC
  Administered 2024-06-16 – 2024-06-20 (×8): 10 mg via ORAL
  Filled 2024-06-16 (×5): qty 1

## 2024-06-16 MED ORDER — CLOPIDOGREL BISULFATE 75 MG PO TABS
75.0000 mg | ORAL_TABLET | Freq: Every day | ORAL | Status: DC
  Administered 2024-06-16 – 2024-06-20 (×8): 75 mg via ORAL
  Filled 2024-06-16 (×5): qty 1

## 2024-06-16 NOTE — TOC Progression Note (Signed)
 Transition of Care [facility]) - Progression Note    Patient Details  Name: Lori Wallace MRN: [id number] Date of Birth: 05-Nov-1936  [facility] CM/SW Contact  [place] L Robert [place], [place] Phone Number: 06/16/2024, 1:33 PM  Clinical Narrative:     CSW spoke with patient niece advising of SNF choice. Ms. Joshua advised that she will give CSW a call back with a choice. Patient was offered a bed at [facility] and [facility].                     Expected Discharge Plan and Services                                               Social Drivers of Health (SDOH) Interventions SDOH Screenings   Food Insecurity: No Food Insecurity (06/12/2024)  Housing: Unknown (06/12/2024)  Transportation Needs: No Transportation Needs (06/12/2024)  Utilities: Not At Risk (06/12/2024)  Financial Resource Strain: Low Risk  (09/28/2023)   Received from [facility]  Social Connections: Unknown (06/12/2024)  Tobacco Use: Medium Risk (06/12/2024)    Readmission Risk Interventions     No data to display

## 2024-06-16 NOTE — Plan of Care (Signed)
  Problem: Education: Goal: Knowledge of disease or condition will improve Outcome: Progressing Goal: Knowledge of secondary prevention will improve (MUST DOCUMENT ALL) Outcome: Progressing Goal: Knowledge of patient specific risk factors will improve (DELETE if not current risk factor) Outcome: Progressing   Problem: Ischemic Stroke/TIA Tissue Perfusion: Goal: Complications of ischemic stroke/TIA will be minimized Outcome: Progressing   Problem: Coping: Goal: Will verbalize positive feelings about self Outcome: Progressing Goal: Will identify appropriate support needs Outcome: Progressing   Problem: Health Behavior/Discharge Planning: Goal: Ability to manage health-related needs will improve Outcome: Progressing Goal: Goals will be collaboratively established with patient/family Outcome: Progressing   Problem: Nutrition: Goal: Risk of aspiration will decrease Outcome: Progressing Goal: Dietary intake will improve Outcome: Progressing   Problem: Education: Goal: Knowledge of General Education information will improve Description: Including pain rating scale, medication(s)/side effects and non-pharmacologic comfort measures Outcome: Progressing   Problem: Health Behavior/Discharge Planning: Goal: Ability to manage health-related needs will improve Outcome: Progressing   Problem: Clinical Measurements: Goal: Ability to maintain clinical measurements within normal limits will improve Outcome: Progressing Goal: Will remain free from infection Outcome: Progressing Goal: Diagnostic test results will improve Outcome: Progressing Goal: Respiratory complications will improve Outcome: Progressing Goal: Cardiovascular complication will be avoided Outcome: Progressing   Problem: Nutrition: Goal: Adequate nutrition will be maintained Outcome: Progressing   Problem: Coping: Goal: Level of anxiety will decrease Outcome: Progressing   Problem: Safety: Goal: Ability to  remain free from injury will improve Outcome: Progressing

## 2024-06-16 NOTE — Progress Notes (Addendum)
 PROGRESS NOTE    Lori Wallace   FMW:[id number] DOB: [birth year]  DOA: 06/12/2024 Date of Service: 06/16/24 which is hospital day 3  PCP: [name], MD    Hospital course / significant events:   HPI: Lori Wallace is a 88 y.o. female with medical history significant of HTN, skin cancer, breast cancer status post bilateral lumpectomy, presented with strokelike symptoms, including recent L sided weakness, fall w/o LOC, slurred speech. Went to see PCP, suspected TIA, startes on ASA. Pt continues to have speech difficulty and swallowing difficulty, memory problems. Family brought pt to ED 08/06 08/06: hypertensive, bradycardic. Unremarkable CBC, CMP, UA. Admitted to hospitalist for TIA/CVA w/u. Imaging: CTA H/N (+)75% narrowing on the right carotid bifurcation site with signs of distal intramural thrombosis formation. Admitting hospitalist D/W oncall neurology Dr. Lindzen - he recommended carotid doppler to confirm CTA finding and permissive HTN, will see patient in AM to decide next step. MRI brain unable to complete d/t claustrophobia and motion artifact but despite these limitations MRI did reveal acute infarcts noted within the right frontal lobe involving the corona radiata and periventricular white matter 08/07: Carotid US  today, confirmed stenosis, no mention of thrombus. Vascular surgery consult. Echo EF 60-65, Grade 1 diast df.  08/08: vascular surgery consult - family is leaning toward nonsurgical management but is discussing further before final decision. Await SNF placement for rehab  08/09: family brings MOST form - pt has indicated previously for DNR so orders updated. Pt also indicated limited interventions, was not specific about surgeries but family is leaning toward no endarterectomy / discuss outpatient and treat medically for now.  08/10: pending SNF placement thru the weekend      Consultants:  Neurology  Vascular surgery    Procedures/Surgeries: none      ASSESSMENT & PLAN:   Acute CVA  Acute left paresis, resolved Acute aphasia, resolved Acute dysphagia ASA, statin  If decide not to have CEA, add Plavix  to ASA and continue DAPT indefinitely. Started Plavix  now PT OT and speech evaluation - rehab pending    CTA H/N clinically significant findings of 75% narrowing on the right carotid bifurcation site with signs of distal intramural thrombosis formation.  ASA Consult to vascular re carotid stenosis --> risk of surgery in light of pt's previously known preferences for no aggressive measures, family is leaning toward no surgery If decide not to have CEA, add Plavix  to ASA and continue DAPT indefinitely. Started Plavix  now  HTN Held atenolol  for permissive HTN, also bradycardic. HR and BP stable now will adjust emds as needed    Bradycardia Likely beta blocker effect Hold atenolol   Fall at home Acute on chronic ambulation impairment No fracture or dislocation on left hip PT OT evaluation   Chronic normocytic anemia H&H stable, no symptoms or signs of active bleeding Monitor CBC  Depression Dementia Holding buproprion  Restart mirtazapine given insomnia Zyprexa  prn severe agitation as this tends to have less risk confusion in elderly, avoid Haldol - per neurology  Required sitter briefly, order d/c 06/16/24 8:38 AM    No concerns based on BMI: Body mass index is 22.96 kg/m.[name] Significantly low or high BMI is associated with higher medical risk.  Underweight - under 18  overweight - 25 to 29 obese - 30 or more Class 1 obesity: BMI of 30.0 to 34 Class 2 obesity: BMI of 35.0 to 39 Class 3 obesity: BMI of 40.0 to 49 Super Morbid Obesity: BMI 50-59 Super-super Morbid Obesity: BMI  60+ Healthy nutrition and physical activity advised as adjunct to other disease management and risk reduction treatments    DVT prophylaxis: lovenox  IV fluids: holding continuous IV fluids  Nutrition: per  SLP Central lines / other devices: none  Code Status: FULL CODE ACP documentation reviewed: none on file in [facility]  TOC needs: SNF rehab placement Medical barriers to dispo: pending vascular surgery eval. Expected medical readiness for discharge pending their recs / SNF placement.              Subjective / Brief ROS:  Denies CP/SOB.  Pain controlled.  Denies new weakness.  Tolerating diet.   Family Communication: family at bedside on rounds     Objective Findings:  Vitals:   06/16/24 0505 06/16/24 0619 06/16/24 0905 06/16/24 1204  BP: (!) 150/66  133/61 (!) 143/75  Pulse: 87  84 72  Resp: 20  17 16   Temp: 98.1 F (36.7 C)  97.9 F (36.6 C) 97.9 F (36.6 C)  TempSrc: Oral     SpO2: 97%  96% 98%  Weight:  74 kg    Height:        Intake/Output Summary (Last 24 hours) at 06/16/2024 1431 Last data filed at 06/16/2024 1300 Gross per 24 hour  Intake 720 ml  Output --  Net 720 ml   Filed Weights   06/12/24 1132 06/16/24 0619  Weight: 72.6 kg 74 kg    Examination:  Physical Exam Constitutional:      General: She is not in acute distress. Pulmonary:     Effort: Pulmonary effort is normal.  Neurological:     Mental Status: She is alert.  Psychiatric:        Behavior: Behavior normal.          Scheduled Medications:   aspirin  EC  81 mg Oral Daily   docusate sodium   200 mg Oral BID   enoxaparin  (LOVENOX ) injection  40 mg Subcutaneous Q24H   feeding supplement  237 mL Oral BID BM   loratadine   10 mg Oral Daily   polyethylene glycol  17 g Oral Daily   rosuvastatin   40 mg Oral Daily   sodium chloride  flush  3 mL Intravenous Once    Continuous Infusions:   PRN Medications:  acetaminophen  **OR** acetaminophen  (TYLENOL ) oral liquid 160 mg/5 mL **OR** acetaminophen , bisacodyl , hydrALAZINE , LORazepam , morphine  injection, OLANZapine , senna-docusate  Antimicrobials from admission:  Anti-infectives (From admission, onward)    None            Data Reviewed:  I have personally reviewed the following...  CBC: Recent Labs  Lab 06/12/24 1136 06/14/24 0358  WBC 9.3 11.3*  NEUTROABS 5.5  --   HGB 11.9* 13.3  HCT 37.0 41.6  MCV 96.6 96.5  PLT 265 303   Basic Metabolic Panel: Recent Labs  Lab 06/12/24 1136 06/14/24 0358  NA 139 140  K 4.0 4.1  CL 104 105  CO2 27 26  GLUCOSE 94 134*  BUN 25* 37*  CREATININE 1.06* 1.22*  CALCIUM  9.2 9.4   GFR: Estimated Creatinine Clearance: 36.5 mL/min (A) (by C-G formula based on SCr of 1.22 mg/dL (H)). Liver Function Tests: Recent Labs  Lab 06/12/24 1136  AST 20  ALT 15  ALKPHOS 63  BILITOT 1.0  PROT 7.7  ALBUMIN 3.7   No results for input(s): LIPASE, AMYLASE in the last 168 hours. No results for input(s): AMMONIA in the last 168 hours. Coagulation Profile: Recent Labs  Lab 06/12/24 1136  INR 1.0   Cardiac Enzymes: Recent Labs  Lab 06/13/24 1748  CKTOTAL 115   BNP (last 3 results) No results for input(s): PROBNP in the last 8760 hours. HbA1C: No results for input(s): HGBA1C in the last 72 hours.  CBG: Recent Labs  Lab 06/12/24 1138  GLUCAP 102*   Lipid Profile: No results for input(s): CHOL, HDL, LDLCALC, TRIG, CHOLHDL, LDLDIRECT in the last 72 hours.  Thyroid Function Tests: No results for input(s): TSH, T4TOTAL, FREET4, T3FREE, THYROIDAB in the last 72 hours. Anemia Panel: No results for input(s): VITAMINB12, FOLATE, FERRITIN, TIBC, IRON, RETICCTPCT in the last 72 hours. Most Recent Urinalysis On File:     Component Value Date/Time   COLORURINE STRAW (A) 06/12/2024 1206   APPEARANCEUR CLEAR (A) 06/12/2024 1206   APPEARANCEUR Clear 09/24/2014 1532   LABSPEC 1.004 (L) 06/12/2024 1206   LABSPEC 1.004 09/24/2014 1532   PHURINE 6.0 06/12/2024 1206   GLUCOSEU NEGATIVE 06/12/2024 1206   GLUCOSEU Negative 09/24/2014 1532   HGBUR NEGATIVE 06/12/2024 1206   BILIRUBINUR NEGATIVE 06/12/2024 1206    BILIRUBINUR Negative 09/24/2014 1532   KETONESUR NEGATIVE 06/12/2024 1206   PROTEINUR NEGATIVE 06/12/2024 1206   UROBILINOGEN 0.2 10/21/2014 1259   NITRITE NEGATIVE 06/12/2024 1206   LEUKOCYTESUR NEGATIVE 06/12/2024 1206   LEUKOCYTESUR Negative 09/24/2014 1532   Sepsis Labs: @LABRCNTIP (procalcitonin:4,lacticidven:4) Microbiology: No results found for this or any previous visit (from the past 240 hours).    Radiology Studies last 3 days: US  Carotid Bilateral Result Date: 06/13/2024 CLINICAL DATA:  Thrombosis. EXAM: BILATERAL CAROTID DUPLEX ULTRASOUND TECHNIQUE: Elnor scale imaging, color Doppler and duplex ultrasound were performed of bilateral carotid and vertebral arteries in the neck. COMPARISON:  Neck CTA 06/12/2024 FINDINGS: Criteria: Quantification of carotid stenosis is based on velocity parameters that correlate the residual internal carotid diameter with NASCET-based stenosis levels, using the diameter of the distal internal carotid lumen as the denominator for stenosis measurement. The following velocity measurements were obtained: RIGHT ICA: 241 cm/sec CCA: 77 cm/sec SYSTOLIC ICA/CCA RATIO:  3.1 ECA: 259 cm/sec LEFT ICA: 160 cm/sec Bulb: 213 cm/sec CCA: 77 cm/sec SYSTOLIC ICA/CCA RATIO:  2.1 ECA: 287 cm/sec RIGHT CAROTID ARTERY: Irregular echogenic plaque with shadowing at the right carotid bulb. Elevated peak systolic velocity in the proximal external carotid artery compatible with stenosis near the origin. This finding corresponds with the recent CTA findings. Elevated peak systolic velocity in the proximal right internal carotid artery measuring 241 cm/sec. Mid and distal right internal carotid artery are patent. RIGHT VERTEBRAL ARTERY: Antegrade flow and normal waveform in the right vertebral artery. LEFT CAROTID ARTERY: Echogenic plaque at the left carotid bulb. Elevated peak systolic velocity at the left carotid bulb measuring 214 cm/sec. Elevated peak systolic velocity in the left  external carotid artery. Echogenic plaque in the proximal internal carotid artery. Peak systolic velocity in the proximal internal carotid artery is 160 cm/sec. Mid and distal left internal carotid artery are patent. LEFT VERTEBRAL ARTERY: Antegrade flow and normal waveform in the left vertebral artery. IMPRESSION: 1. Atherosclerotic plaque involving bilateral carotid arteries. 2. Estimated degree of stenosis in the right internal carotid artery is greater than 70%. 3. Estimated degree of stenosis at the left carotid bulb and proximal left internal carotid artery is 50-69%. 4. Stenosis involving bilateral external carotid arteries. 5. Patent vertebral arteries bilaterally. Electronically Signed   By: Juliene Balder M.D.   On: 06/13/2024 14:41   ECHOCARDIOGRAM COMPLETE Result Date: 06/13/2024    ECHOCARDIOGRAM REPORT   Patient Name:  Lori Wallace Date of Exam: 06/12/2024 Medical Rec #:  [id number]      Height:       70.0 in Accession #:    [id number]     Weight:       160.0 lb Date of Birth:  1935/12/13       BSA:          1.898 m Patient Age:    87 years       BP:           113/63 mmHg Patient Gender: F              HR:           60 bpm. Exam Location:  [facility] Procedure: 2D Echo, Cardiac Doppler and Color Doppler (Both Spectral and Color            Flow Doppler were utilized during procedure). Indications:     G45.9 TIA  History:         Patient has no prior history of Echocardiogram examinations.                  Risk Factors:Hypertension.  Sonographer:     Carl Coma RDCS Referring Phys:  [id number] CORT ONEIDA MANA Diagnosing Phys: Evalene Lunger MD IMPRESSIONS  1. Left ventricular ejection fraction, by estimation, is 60 to 65%. The left ventricle has normal function. The left ventricle has no regional wall motion abnormalities. Left ventricular diastolic parameters are consistent with Grade I diastolic dysfunction (impaired relaxation).  2. Right ventricular systolic function is normal. The right ventricular  size is normal. Tricuspid regurgitation signal is inadequate for assessing PA pressure.  3. The mitral valve is normal in structure. No evidence of mitral valve regurgitation. No evidence of mitral stenosis.  4. The aortic valve is normal in structure. Aortic valve regurgitation is not visualized. Aortic valve sclerosis/calcification is present, without any evidence of aortic stenosis.  5. The inferior vena cava is normal in size with greater than 50% respiratory variability, suggesting right atrial pressure of 3 mmHg. FINDINGS  Left Ventricle: Left ventricular ejection fraction, by estimation, is 60 to 65%. The left ventricle has normal function. The left ventricle has no regional wall motion abnormalities. Strain was performed and the global longitudinal strain is indeterminate. The left ventricular internal cavity size was normal in size. There is no left ventricular hypertrophy. Left ventricular diastolic parameters are consistent with Grade I diastolic dysfunction (impaired relaxation). Right Ventricle: The right ventricular size is normal. No increase in right ventricular wall thickness. Right ventricular systolic function is normal. Tricuspid regurgitation signal is inadequate for assessing PA pressure. Left Atrium: Left atrial size was normal in size. Right Atrium: Right atrial size was normal in size. Pericardium: There is no evidence of pericardial effusion. Mitral Valve: The mitral valve is normal in structure. Mild mitral annular calcification. No evidence of mitral valve regurgitation. No evidence of mitral valve stenosis. Tricuspid Valve: The tricuspid valve is normal in structure. Tricuspid valve regurgitation is mild . No evidence of tricuspid stenosis. Aortic Valve: The aortic valve is normal in structure. Aortic valve regurgitation is not visualized. Aortic valve sclerosis/calcification is present, without any evidence of aortic stenosis. Pulmonic Valve: The pulmonic valve was normal in structure.  Pulmonic valve regurgitation is not visualized. No evidence of pulmonic stenosis. Aorta: The aortic root is normal in size and structure. Venous: The inferior vena cava is normal in size with greater than 50% respiratory variability, suggesting right atrial pressure  of 3 mmHg. IAS/Shunts: No atrial level shunt detected by color flow Doppler. Additional Comments: 3D was performed not requiring image post processing on an independent workstation and was indeterminate.  LEFT VENTRICLE PLAX 2D LVIDd:         3.90 cm   Diastology LVIDs:         2.20 cm   LV e' medial:    5.71 cm/s LV PW:         1.00 cm   LV E/e' medial:  16.1 LV IVS:        1.00 cm   LV e' lateral:   5.33 cm/s LVOT diam:     2.00 cm   LV E/e' lateral: 17.2 LV SV:         88 LV SV Index:   46 LVOT Area:     3.14 cm  RIGHT VENTRICLE RV Basal diam:  3.20 cm RV S prime:     15.30 cm/s TAPSE (M-mode): 2.8 cm LEFT ATRIUM             Index        RIGHT ATRIUM          Index LA diam:        3.50 cm 1.84 cm/m   RA Area:     8.71 cm LA Vol (A2C):   30.3 ml 15.96 ml/m  RA Volume:   19.70 ml 10.38 ml/m LA Vol (A4C):   28.2 ml 14.85 ml/m LA Biplane Vol: 30.6 ml 16.12 ml/m  AORTIC VALVE LVOT Vmax:   120.50 cm/s LVOT Vmean:  77.750 cm/s LVOT VTI:    0.280 m  AORTA Ao Root diam: 3.30 cm Ao Asc diam:  3.40 cm MITRAL VALVE MV Area (PHT): 2.48 cm     SHUNTS MV Decel Time: 306 msec     Systemic VTI:  0.28 m MV E velocity: 91.70 cm/s   Systemic Diam: 2.00 cm MV A velocity: 129.50 cm/s MV E/A ratio:  0.71 Evalene Lunger MD Electronically signed by Evalene Lunger MD Signature Date/Time: 06/13/2024/2:14:35 PM    Final    MR BRAIN WO CONTRAST Result Date: 06/12/2024 EXAM: MRI BRAIN WITHOUT CONTRAST 06/12/2024 04:28:07 PM TECHNIQUE: Multiplanar multisequence MRI of the head/brain was performed without the administration of intravenous contrast. Best obtainable images due to patient becoming claustrophobic and refusing to continue exam. Axial and coronal DWI and ADC  images were obtained. These images are noted by motion artifact. COMPARISON: Same day CTA head and neck. CLINICAL HISTORY: Transient ischemic attack (TIA). FINDINGS: BRAIN AND VENTRICLES: There are areas of acute infarct within the right frontal lobe involving the corona radiata and periventricular white matter. There is periventricular volume loss and findings suggestive of chronic microvascular ischemic changes. No midline shift. No hydrocephalus. IMPRESSION: 1. Patient unable to complete exam due to claustrophobia. Limited DWI and ADC images obtained. Motion artifact. 2. Within these limitations, acute infarcts noted within the right frontal lobe involving the corona radiata and periventricular white matter. Electronically signed by: Donnice Mania MD 06/12/2024 05:20 PM EDT RP Workstation: [id number]   CT ANGIO HEAD NECK W WO CM Addendum Date: 06/12/2024 ~~~~ADDENDUM #1 ~~~~ EXAM: CT HEAD WITHOUT CTA HEAD AND NECK WITH AND WITHOUT 06/12/2024 04:10:00 PM TECHNIQUE: CTA of the head and neck was performed with and without the administration of intravenous contrast. Noncontrast CT of the head with reconstructed 2-D images are also provided for review. Multiplanar 2D and/or 3D reformatted images are provided for review. Automated exposure control,  iterative reconstruction, and/or weight based adjustment of the mA/kV was utilized to reduce the radiation dose to as low as reasonably achievable. COMPARISON: CT head earlier same day as well as carotid ultrasound dated 07/01/2024. CLINICAL HISTORY: Stroke/TIA, assess for intracardiac shunt. Pt presents with R side weakness, aphasia that started 8/4. She did have a fall at that time and has been having L hip pain. Pt saw her PCP yesterday and was started on ASA. FINDINGS: CT HEAD: BRAIN AND VENTRICLES: No acute intracranial hemorrhage. No mass effect. No midline shift. No extra-axial fluid collection. Gray-white differentiation is maintained. No hydrocephalus. ORBITS: No  acute abnormality. SINUSES AND MASTOIDS: No acute abnormality. CTA NECK: AORTIC ARCH AND ARCH VESSELS: Mixed atherosclerotic plaque within the visualized aortic arch. Atherosclerosis involves the origin of the left subclavian artery without high-grade stenosis. CERVICAL CAROTID ARTERIES: The right carotid artery is patent from the origin to the skull base. Mixed attenuation atherosclerotic plaque at the carotid bifurcation with prominent noncalcified component extending to the carotid bulb. There is approximately 75% stenosis at the portion of the right cervical ICA within the proximal right cervical ICA. There is intraluminal soft tissue concerning for focal intraluminal prominence visualized on series 6 image 229 and on series 8 image 57. Intraluminal thrombus is associated with approximately 50% luminal narrowing. Additional mixed atherosclerotic plaque in the distal right cervical ICA without hemodynamically significant stenosis. There is bulky calcified atherosclerosis at the left carotid bifurcation resulting in approximately 60% stenosis of the origin of the left cervical ICA. CERVICAL VERTEBRAL ARTERIES: Patent vertebral arteries in the neck. LUNGS AND MEDIASTINUM: Biapical pleural parenchymal scarring. SOFT TISSUES: No acute abnormality. BONES: Degenerative changes in the visualized spine. There is fusion of the C5 and C6 vertebral bodies. CTA HEAD: ANTERIOR CIRCULATION: The intracranial internal carotid arteries are patent bilaterally. The MCAs are patent bilaterally. The ACAs are patent bilaterally. 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 POSTERIOR CIRCULATION: Diminutive appearance of the basilar artery with significant distal tapering of the basilar artery after the origin of the superior cerebellar arteries. The PCAs are patent bilaterally and are primarily supplied by the anterior circulation. OTHER: No dural venous sinus thrombosis on this non-dedicated study.  IMPRESSION: 1. Mixed atherosclerotic plaque at the right carotid bifurcation resulting in approximately 75% stenosis of the right cervical ICA origin. Focal intraluminal soft tissue in the proximal right cervical ICA concerning for intraluminal thrombus with approximately 50% luminal narrowing. 2. Approximately 60% stenosis of the origin of the left cervical ICA due to calcified atherosclerosis. 3. Additional atherosclerosis as above. 4. Findings suggestive of vertebrobasilar hypoplasia. 5. Impression point #1 discussed with Dr. Laurita at 4:42PM on 06/12/24. Electronically signed by: Donnice Mania MD 06/12/2024 04:55 PM EDT RP Workstation: [id number]   Result Date: 06/12/2024 ~~~~ORIGINAL REPORT ~~~~ EXAM: CT HEAD WITHOUT CTA HEAD AND NECK WITH AND WITHOUT 06/12/2024 04:10:00 PM TECHNIQUE: CTA of the head and neck was performed with and without the administration of intravenous contrast. Noncontrast CT of the head with reconstructed 2-D images are also provided for review. Multiplanar 2D and/or 3D reformatted images are provided for review. Automated exposure control, iterative reconstruction, and/or weight based adjustment of the mA/kV was utilized to reduce the radiation dose to as low as reasonably achievable. COMPARISON: CT head earlier same day as well as carotid ultrasound dated 07/01/2024. CLINICAL HISTORY: Stroke/TIA, assess for intracardiac shunt. Pt presents with R side weakness, aphasia that started 8/4. She did have a fall at that time and has been  having L hip pain. Pt saw her PCP yesterday and was started on ASA. FINDINGS: CT HEAD: BRAIN AND VENTRICLES: No acute intracranial hemorrhage. No mass effect. No midline shift. No extra-axial fluid collection. Gray-white differentiation is maintained. No hydrocephalus. ORBITS: No acute abnormality. SINUSES AND MASTOIDS: No acute abnormality. CTA NECK: AORTIC ARCH AND ARCH VESSELS: Mixed atherosclerotic plaque within the visualized aortic arch. Atherosclerosis  involves the origin of the left subclavian artery without high-grade stenosis. CERVICAL CAROTID ARTERIES: The right carotid artery is patent from the origin to the skull base. Mixed attenuation atherosclerotic plaque at the carotid bifurcation with prominent noncalcified component extending to the carotid bulb. There is approximately 75% stenosis at the portion of the right cervical ICA within the proximal right cervical ICA. There is intraluminal soft tissue concerning for focal intraluminal prominence visualized on series 6 image 229 and on series 8 image 57. Intraluminal thrombus is associated with approximately 50% luminal narrowing. Additional mixed atherosclerotic plaque in the distal right cervical ICA without hemodynamically significant stenosis. There is bulky calcified atherosclerosis at the left carotid bifurcation resulting in approximately 60% stenosis of the origin of the left cervical ICA. CERVICAL VERTEBRAL ARTERIES: Patent vertebral arteries in the neck. LUNGS AND MEDIASTINUM: Biapical pleural parenchymal scarring. SOFT TISSUES: No acute abnormality. BONES: Degenerative changes in the visualized spine. There is fusion of the C5 and C6 vertebral bodies. CTA HEAD: ANTERIOR CIRCULATION: The intracranial internal carotid arteries are patent bilaterally. The MCAs are patent bilaterally. The ACAs are patent bilaterally. 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 POSTERIOR CIRCULATION: Diminutive appearance of the basilar artery with significant distal tapering of the basilar artery after the origin of the superior cerebellar arteries. The PCAs are patent bilaterally and are primarily supplied by the anterior circulation. OTHER: No dural venous sinus thrombosis on this non-dedicated study. IMPRESSION: 1. Mixed atherosclerotic plaque at the right carotid bifurcation resulting in approximately 75% stenosis of the right cervical ICA origin. Focal intraluminal soft tissue in the  proximal right cervical ICA concerning for intraluminal thrombus with approximately 50% luminal narrowing. 2. Approximately 60% stenosis of the origin of the left cervical ICA due to calcified atherosclerosis. 3. Additional atherosclerosis as above. 4. Findings suggestive of vertebrobasilar hypoplasia. Electronically signed by: Donnice Mania MD 06/12/2024 04:40 PM EDT RP Workstation: [id number]   DG Hip Unilat With Pelvis 2-3 Views Left Result Date: 06/12/2024 EXAM: 2 or 3 VIEW(S) XRAY OF THE PELVIS AND LEFT HIP 06/12/2024 12:45:46 PM COMPARISON: 04/25/2021 CLINICAL HISTORY: Fall. FINDINGS: JOINTS: Postoperative changes from previous left total hip arthroplasty. No signs of acute fracture or dislocation. Chronic left superior and inferior pubic rami fractures identified. Degenerative changes noted within the right hip. 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 SOFT TISSUES: Mild osteopenia. IMPRESSION: 1. No acute fracture or dislocation of the left hip. 2. Chronic left superior and inferior pubic rami fractures. 3. Degenerative changes in the right hip. 4. 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 Electronically signed by: Waddell Calk MD 06/12/2024 02:04 PM EDT RP Workstation: [id number]   CT HEAD WO CONTRAST Result Date: 06/12/2024 EXAM: CT HEAD WITHOUT CONTRAST 06/12/2024 12:24:12 PM TECHNIQUE: CT of the head was performed without the administration of intravenous contrast. Automated exposure control, iterative reconstruction, and/or weight based adjustment of the mA/kV was utilized to reduce the radiation dose to as low as reasonably achievable. COMPARISON: 07/13/2021 CLINICAL HISTORY: Neuro deficit, acute, stroke suspected. Pt presents with  R side weakness, aphasia that started 8/4. She did have a fall at that time and has been having L hip pain. Pt saw her PCP yesterday and was started on ASA. Pt continued to have progressive worsening of  weakness and was having difficulty feeding herself last night. Pt is A/Ox4 during triage with slow slurred speech noted. FINDINGS: BRAIN AND VENTRICLES: No acute hemorrhage. Gray-white differentiation is preserved. No hydrocephalus. No extra-axial collection. No mass effect or midline shift. Nonspecific hypoattenuation in the periventricular and subcortical white matter, most likely representing chronic small vessel disease. Generalized parenchymal volume loss. ORBITS: No acute abnormality. SINUSES: No acute abnormality. SOFT TISSUES AND SKULL: No acute soft tissue abnormality. No skull fracture. VASCULATURE: Atherosclerosis in the carotid siphons. IMPRESSION: 1. No acute intracranial abnormality. 2. Nonspecific hypoattenuation in the periventricular and subcortical white matter, most likely representing chronic small vessel disease. Electronically signed by: Donnice Mania MD 06/12/2024 01:05 PM EDT RP Workstation: [id number]          Laneta Blunt, DO [facility] 06/16/2024, 2:31 PM    Dictation software may have been used to generate the above note. Typos may occur and escape review in typed/dictated notes. Please contact Dr Blunt directly for clarity if needed.  Staff may message me via secure chat in Epic  but this may not receive an immediate response,  please page me for urgent matters!  If 7PM-7AM, please contact night coverage [email or web address]

## 2024-06-16 NOTE — Care Management Important Message (Signed)
 Important Message  Patient Details  Name: Lori Wallace MRN: [id number] Date of Birth: Dec 22, 1935   Important Message Given:  Yes - Medicare IM     Rhiannon Sassaman W, CMA 06/16/2024, 10:44 AM

## 2024-06-16 NOTE — Plan of Care (Signed)
  Problem: Education: Goal: Knowledge of disease or condition will improve Outcome: Progressing Goal: Knowledge of secondary prevention will improve (MUST DOCUMENT ALL) Outcome: Progressing Goal: Knowledge of patient specific risk factors will improve (DELETE if not current risk factor) Outcome: Progressing   Problem: Ischemic Stroke/TIA Tissue Perfusion: Goal: Complications of ischemic stroke/TIA will be minimized Outcome: Progressing   Problem: Coping: Goal: Will verbalize positive feelings about self Outcome: Progressing Goal: Will identify appropriate support needs Outcome: Progressing   Problem: Self-Care: Goal: Ability to participate in self-care as condition permits will improve Outcome: Progressing Goal: Verbalization of feelings and concerns over difficulty with self-care will improve Outcome: Progressing Goal: Ability to communicate needs accurately will improve Outcome: Progressing   Problem: Activity: Goal: Risk for activity intolerance will decrease Outcome: Progressing   Problem: Elimination: Goal: Will not experience complications related to bowel motility Outcome: Progressing Goal: Will not experience complications related to urinary retention Outcome: Progressing

## 2024-06-17 DIAGNOSIS — G459 Transient cerebral ischemic attack, unspecified: Secondary | ICD-10-CM | POA: Diagnosis not present

## 2024-06-17 MED ORDER — FLEET ENEMA RE ENEM: 1.0000 | ENEMA | Freq: Every day | RECTAL | Status: DC | PRN

## 2024-06-17 MED ORDER — SENNA 8.6 MG PO TABS
2.0000 | ORAL_TABLET | Freq: Once | ORAL | Status: AC
  Administered 2024-06-17 (×2): 17.2 mg via ORAL
  Filled 2024-06-17: qty 2

## 2024-06-17 MED ORDER — DIPHENHYDRAMINE HCL 25 MG PO CAPS: 25.0000 mg | ORAL_CAPSULE | Freq: Three times a day (TID) | ORAL | Status: DC | PRN

## 2024-06-17 NOTE — Progress Notes (Signed)
 SLP Cancellation Note  Patient Details Name: Lori Wallace MRN: [id number] DOB: February 01, 1936   Cancelled treatment:       Reason Eval/Treat Not Completed: Other (comment)  Pt being followed for cognitive communication intervention s/p acute CVA. Initial evaluation and education completed with family. Recommend follow up SLP services at next venue of care. Per chart review- pt is pending SNF placement.   Lori Uchenna Rappaport Clapp, MS, CCC-SLP Speech Language Pathologist Rehab Services; [facility] [phone] (ascom)   Lori Wallace 06/17/2024, 2:29 PM

## 2024-06-17 NOTE — Plan of Care (Signed)

## 2024-06-17 NOTE — Progress Notes (Signed)
 Occupational Therapy Treatment Patient Details Name: Lori Wallace MRN: [id number] DOB: 1935-12-16 Today's Date: 06/17/2024   History of present illness Pt is a 88 y.o. female presented with strokelike symptoms. 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 PMH of HTN, skin cancer, breast cancer s/p bilateral lumpectomy.   OT comments  Pt is seated in recliner on arrival. Pleasant and agreeable to PT/OT co-tx session to maximize safety and progress mobility. She denies pain. Pt performed seated self feeding without back support seated on edge of recliner to promote improved balance and core strength with supervision. She continues to need cueing for taking smaller bites, pacing during self feeding. Multiple STS trials performed from recliner via verbal and tactile cueing for hand/feet placement, able to stand with Mod A x1, Min A x2 and Min A x1 for RW stabilization as pt typically pulls up on rollator at home. She was able to progress to taking a few forward steps with step by step cueing and close chair follow using RW with Min A x1 and second assist for safety. Pt with little to no L lateral lean during session and better ability to follow directions this date.  Pt left seated in recliner with all needs in place and will cont to require skilled acute OT services to maximize her safety and IND to return to PLOF.       If plan is discharge home, recommend the following:  A lot of help with bathing/dressing/bathroom;Direct supervision/assist for medications management;Supervision due to cognitive status;Direct supervision/assist for financial management;Assist for transportation;Assistance with cooking/housework;Help with stairs or ramp for entrance;Two people to help with walking and/or transfers   Equipment Recommendations  Other (comment) (defer)    Recommendations for Other Services      Precautions /  Restrictions Precautions Precautions: Fall Recall of Precautions/Restrictions: Impaired Precaution/Restrictions Comments: Pt has impaired cognition and is impulsive Restrictions Weight Bearing Restrictions Per Provider Order: No       Mobility Bed Mobility               General bed mobility comments: NT pt in recliner pre/post session    Transfers Overall transfer level: Needs assistance Equipment used: Rolling walker (2 wheels) Transfers: Sit to/from Stand Sit to Stand: Mod assist, Min assist, +2 physical assistance           General transfer comment: multiple STS trials performed this date; able to sit in recliner without support with good balance and minimal to no L lateral lean; intially stood with Mod A x1 and second person providing CGA for safety; Min A x2 on 2nd stand to RW with cues for hand/feet placement; stood 2-3 more trials from recliner to RW with Min A x1 for RW stabilization and CGA x1 for safety as pt typically pulls from her rollator to stand; progressed to taking ~3 steps foward with step by step cueing using RW     Balance Overall balance assessment: Needs assistance Sitting-balance support: Feet supported, Single extremity supported Sitting balance-Leahy Scale: Poor Sitting balance - Comments: fatigues easily, but able to maintain seated balance without support in recliner for self feeding   Standing balance support: Bilateral upper extremity supported, Reliant on assistive device for balance Standing balance-Leahy Scale: Poor Standing balance comment: Min A x2 at RW with multi modal cueing to maintain upright posture; limited standing tolerance  ADL either performed or assessed with clinical judgement   ADL Overall ADL's : Needs assistance/impaired Eating/Feeding: Supervision/ safety;Cueing for safety;Sitting Eating/Feeding Details (indicate cue type and reason): continues to need cueing for pacing and taking  smaller bites and chewing food up completely prior to taking another bite; appears to have better use of LUE this date                                        Extremity/Trunk Assessment              Vision       Perception     Praxis     Communication Communication Communication: No apparent difficulties   Cognition Arousal: Alert Behavior During Therapy: WFL for tasks assessed/performed                                 Following commands: Impaired Following commands impaired: Only follows one step commands consistently      Cueing   Cueing Techniques: Verbal cues, Visual cues  Exercises      Shoulder Instructions       General Comments much improvement in L lateral lean this date    Pertinent Vitals/ Pain       Pain Assessment Pain Assessment: No/denies pain  Home Living                                          Prior Functioning/Environment              Frequency  Min 2X/week        Progress Toward Goals  OT Goals(current goals can now be found in the care plan section)  Progress towards OT goals: Progressing toward goals  Acute Rehab OT Goals Patient Stated Goal: improve function OT Goal Formulation: With patient/family Time For Goal Achievement: 06/27/24 Potential to Achieve Goals: Fair  Plan      Co-evaluation    PT/OT/SLP Co-Evaluation/Treatment: Yes Reason for Co-Treatment: For patient/therapist safety;To address functional/ADL transfers PT goals addressed during session: Mobility/safety with mobility;Balance OT goals addressed during session: ADL's and self-care      AM-PAC OT 6 Clicks Daily Activity     Outcome Measure   Help from another person eating meals?: None Help from another person taking care of personal grooming?: A Little Help from another person toileting, which includes using toliet, bedpan, or urinal?: A Lot Help from another person bathing (including washing,  rinsing, drying)?: A Lot Help from another person to put on and taking off regular upper body clothing?: A Lot Help from another person to put on and taking off regular lower body clothing?: A Lot 6 Click Score: 15    End of Session Equipment Utilized During Treatment: Gait belt;Rolling walker (2 wheels)  OT Visit Diagnosis: Other abnormalities of gait and mobility (R26.89);Unsteadiness on feet (R26.81);Muscle weakness (generalized) (M62.81)   Activity Tolerance Patient tolerated treatment well   Patient Left with family/visitor present;in chair;with call bell/phone within reach;with chair alarm set   Nurse Communication Mobility status        Time: 8996-8968 OT Time Calculation (min): 28 min  Charges: OT General Charges $OT Visit: 1 Visit OT Treatments $Self Care/Home Management : 8-22 mins  [address]  Raydel Hosick, OTR/L  06/17/24, 11:31 AM   Korbyn Chopin E Lucianna Ostlund 06/17/2024, 11:24 AM

## 2024-06-17 NOTE — Progress Notes (Signed)
   06/17/24 1400  Spiritual Encounters  Type of Visit Initial  Care provided to: Pt and family  Conversation partners present during encounter Nurse  Referral source Nurse (RN/NT/LPN)  Reason for visit Advance directives  OnCall Visit Yes  Interventions  Spiritual Care Interventions Made Established relationship of care and support;Compassionate presence;Encouragement  Intervention Outcomes  Outcomes Connection to spiritual care  Advance Directives (For Healthcare)  Does Patient Have a Medical Advance Directive? No   Chaplain explained Advance Directive to patient and family. Patient understood and name two people to be Power of [employer] Chaplain reached out to several Notaries and have not gotten one yet, but will keep trying. Chaplain tried Pensions consultant in [facility] left message, Mother Baby unavailable, 1A did not have their stamp, and Notary Admin Office left message on voicemail.

## 2024-06-17 NOTE — Progress Notes (Addendum)
 Physical Therapy Treatment Patient Details Name: Lori Wallace MRN: [id number] DOB: [birth year] Today's Date: 06/17/2024   History of Present Illness Pt is a 88 y.o. female presented with strokelike symptoms. 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 PMH of HTN, skin cancer, breast cancer s/p bilateral lumpectomy.    PT Comments  PT and OT cotreatment this session. Pt demonstrates improvement with standing tolerance and improved left lateral lean and does not require as much assistance/cuing to correct lateral posturing. Pt able to stand with min-mod assistance x 2 for walker management. Pt requires verbal and visual cuing for hand placement and sequencing. Pt able to take 3 steps with mod assistance x 2 for balance and walker management, second person following with the recliner. Pt is still not at baseline and will benefit from continued skilled PT services.    If plan is discharge home, recommend the following: Two people to help with walking and/or transfers;A lot of help with bathing/dressing/bathroom;Assistance with cooking/housework;Assist for transportation;Help with stairs or ramp for entrance   Can travel by private vehicle     No  Equipment Recommendations  Rolling walker (2 wheels)    Recommendations for Other Services       Precautions / Restrictions Precautions Precautions: Fall Recall of Precautions/Restrictions: Impaired Precaution/Restrictions Comments: Pt has impaired cognition and is impulsive Restrictions Weight Bearing Restrictions Per Provider Order: No     Mobility  Bed Mobility               General bed mobility comments: NT. Patient received in chair today.    Transfers Overall transfer level: Needs assistance Equipment used: Rolling walker (2 wheels) Transfers: Sit to/from Stand Sit to Stand: Mod assist, Min assist, +2 physical assistance            General transfer comment: Pt able to stand multiple times during session. PT provided mod assistance with second person at side for safety and holding walker. For the second stand requried minimum assistance x2 for walker management and safety. Pt also able to pull up from walker to stand with min A for walker management and second person for safety. Verbal and visual cuing for sequencing and hand placement    Ambulation/Gait Ambulation/Gait assistance: Mod assist, +2 physical assistance Gait Distance (Feet): 4 Feet Assistive device: Rolling walker (2 wheels) Gait Pattern/deviations: Step-to pattern       General Gait Details: Pt able to take three steps with mod assistance, and second person following with recliner. Pt was instructed to do step-to pattern for saefty   Stairs             Wheelchair Mobility     Tilt Bed    Modified Rankin (Stroke Patients Only)       Balance Overall balance assessment: Needs assistance Sitting-balance support: Feet supported, Single extremity supported Sitting balance-Leahy Scale: Poor Sitting balance - Comments: Pt able to maintain sitting balance in recliner while eating without back support. Pt used trunk flexion to bring food to her mouth. Pt used pillow for LUE support. Postural control: Left lateral lean Standing balance support: Bilateral upper extremity supported, Reliant on assistive device for balance Standing balance-Leahy Scale: Poor Standing balance comment: min x 2 to maintain standing balance. Verbal and visual cuing for hand placement  Communication Communication Communication: No apparent difficulties  Cognition Arousal: Alert Behavior During Therapy: WFL for tasks assessed/performed   PT - Cognitive impairments: Sequencing, Safety/Judgement                       PT - Cognition Comments: Pt is impulsive and requires verbal cuing for sequencing and for  safety. Following commands: Impaired Following commands impaired: Only follows one step commands consistently    Cueing Cueing Techniques: Verbal cues, Visual cues  Exercises Other Exercises Other Exercises: standing balance/tolerance: visual scanning to left Other Exercises: modified crunches in recliner Other Exercises: dual tasking: eating while maintaining balance    General Comments General comments (skin integrity, edema, etc.): much improvement in L lateral lean this date      Pertinent Vitals/Pain Pain Assessment Pain Assessment: No/denies pain    Home Living                          Prior Function            PT Goals (current goals can now be found in the care plan section) Acute Rehab PT Goals Patient Stated Goal: can i stand PT Goal Formulation: With patient/family Time For Goal Achievement: 06/26/24 Potential to Achieve Goals: Fair Progress towards PT goals: Progressing toward goals    Frequency    Min 2X/week      PT Plan      Co-evaluation PT/OT/SLP Co-Evaluation/Treatment: Yes Reason for Co-Treatment: For patient/therapist safety;To address functional/ADL transfers PT goals addressed during session: Mobility/safety with mobility;Balance OT goals addressed during session: ADL's and self-care      AM-PAC PT 6 Clicks Mobility   Outcome Measure  Help needed turning from your back to your side while in a flat bed without using bedrails?: A Lot Help needed moving from lying on your back to sitting on the side of a flat bed without using bedrails?: A Lot Help needed moving to and from a bed to a chair (including a wheelchair)?: A Lot Help needed standing up from a chair using your arms (e.g., wheelchair or bedside chair)?: A Little Help needed to walk in hospital room?: A Lot Help needed climbing 3-5 steps with a railing? : Total 6 Click Score: 12    End of Session Equipment Utilized During Treatment: Gait belt Activity Tolerance:  Patient tolerated treatment well;Patient limited by fatigue Patient left: in chair;with call bell/phone within reach;with chair alarm set;with family/visitor present Nurse Communication: Mobility status PT Visit Diagnosis: Muscle weakness (generalized) (M62.81);Difficulty in walking, not elsewhere classified (R26.2);Other symptoms and signs involving the nervous system (R29.898);Unsteadiness on feet (R26.81)     Time: 8996-8968 PT Time Calculation (min) (ACUTE ONLY): 28 min  Charges:    $Neuromuscular Re-education: 8-22 mins PT General Charges $$ ACUTE PT VISIT: 1 Visit                     Luvenia Avers, SPT    Shereese Bonnie 06/17/2024, 11:59 AM

## 2024-06-17 NOTE — Progress Notes (Signed)
 Mobility Specialist - Progress Note   06/17/24 1004  Mobility  Activity Stood at bedside;Pivoted/transferred to/from [facility];Mechanically lifted from bed to chair  Level of Assistance Minimal assist, patient does 75% or more  Assistive Device None;Stedy  Distance Ambulated (ft) 2 ft  Activity Response Tolerated well  Mobility visit 1 Mobility  Mobility Specialist Start Time (ACUTE ONLY) 0946  Mobility Specialist Stop Time (ACUTE ONLY) 1002  Mobility Specialist Time Calculation (min) (ACUTE ONLY) 16 min   Pt seated EOB upon entry, utilizing RA. Pt STS from EOB MinA +2, transferred to the [facility] via SPT HHA +2-- No AD used for transfer to [facility]. Pt STS w/ stedy Mod-MinA +1, wheeled around the bed towards the recliner. Pt STS from steady MinA +1, descended to the recliner with CGA, left seated with alarm set and needs within reach. Family member present at bedside.   America Silvan Mobility Specialist 06/17/24 10:10 AM\

## 2024-06-17 NOTE — Progress Notes (Signed)
 PROGRESS NOTE    Lori Wallace   FMW:[id number] DOB: 1936/05/08  DOA: 06/12/2024 Date of Service: 06/17/24 which is hospital day 4  PCP: [name], MD    Hospital course / significant events:   HPI: Lori Wallace is a 88 y.o. female with medical history significant of HTN, skin cancer, breast cancer status post bilateral lumpectomy, presented with strokelike symptoms, including recent L sided weakness, fall w/o LOC, slurred speech. Went to see PCP, suspected TIA, startes on ASA. Pt continues to have speech difficulty and swallowing difficulty, memory problems. Family brought pt to ED 08/06 08/06: hypertensive, bradycardic. Unremarkable CBC, CMP, UA. Admitted to hospitalist for TIA/CVA w/u. Imaging: CTA H/N (+)75% narrowing on the right carotid bifurcation site with signs of distal intramural thrombosis formation. Admitting hospitalist D/W oncall neurology Dr. Lindzen - he recommended carotid doppler to confirm CTA finding and permissive HTN, will see patient in AM to decide next step. MRI brain unable to complete d/t claustrophobia and motion artifact but despite these limitations MRI did reveal acute infarcts noted within the right frontal lobe involving the corona radiata and periventricular white matter 08/07: Carotid US  today, confirmed stenosis, no mention of thrombus. Vascular surgery consult. Echo EF 60-65, Grade 1 diast df.  08/08: vascular surgery consult - family is leaning toward nonsurgical management but is discussing further before final decision. Await SNF placement for rehab  08/09: family brings MOST form - pt has indicated previously for DNR so orders updated. Pt also indicated limited interventions, was not specific about surgeries but family is leaning toward no endarterectomy / discuss outpatient and treat medically for now.  08/10: awaiting SNF placement thru weekend  08/11: placement pending auth       Consultants:  Neurology  Vascular surgery    Procedures/Surgeries: none      ASSESSMENT & PLAN:   Acute CVA  Acute left paresis, resolved Acute aphasia, resolved Acute dysphagia ASA, statin  If decide not to have CEA, add Plavix  to ASA and continue DAPT indefinitely. Started Plavix  now PT OT and speech evaluation - rehab pending    CTA H/N clinically significant findings of 75% narrowing on the right carotid bifurcation site with signs of distal intramural thrombosis formation.  ASA Consult to vascular re carotid stenosis --> risk of surgery in light of pt's previously known preferences for no aggressive measures, family is leaning toward no surgery If decide not to have CEA, add Plavix  to ASA and continue DAPT indefinitely. Started Plavix  now  HTN Held atenolol  for permissive HTN, also bradycardic. HR and BP stable now will adjust emds as needed    Bradycardia Likely beta blocker effect Hold atenolol   Fall at home Acute on chronic ambulation impairment No fracture or dislocation on left hip PT OT evaluation   Chronic normocytic anemia H&H stable, no symptoms or signs of active bleeding Monitor CBC  Depression Dementia Holding buproprion  Restart mirtazapine given insomnia Zyprexa  prn severe agitation as this tends to have less risk confusion in elderly, avoid Haldol - per neurology  Required sitter briefly, order d/c 06/16/24 8:38 AM   Constipation  Bowel regimen per orders   No concerns based on BMI: Body mass index is 22.96 kg/m.[name] Significantly low or high BMI is associated with higher medical risk.  Underweight - under 18  overweight - 25 to 29 obese - 30 or more Class 1 obesity: BMI of 30.0 to 34 Class 2 obesity: BMI of 35.0 to 39 Class 3 obesity: BMI of  40.0 to 49 Super Morbid Obesity: BMI 50-59 Super-super Morbid Obesity: BMI 60+ Healthy nutrition and physical activity advised as adjunct to other disease management and risk reduction treatments    DVT prophylaxis: lovenox  IV fluids:  holding continuous IV fluids  Nutrition: per SLP Central lines / other devices: none  Code Status: FULL CODE ACP documentation reviewed: none on file in [facility]  TOC needs: SNF rehab placement Medical barriers to dispo: none             Subjective / Brief ROS:  Denies CP/SOB.  Pain controlled.  Denies new weakness.  Tolerating diet.  Constipation  Family Communication: family at bedside on rounds     Objective Findings:  Vitals:   06/16/24 2328 06/17/24 0357 06/17/24 0747 06/17/24 1515  BP: (!) 144/72 (!) 146/77 139/64 (!) 145/71  Pulse: 77 72 66 82  Resp:   18 19  Temp: (!) 97.5 F (36.4 C) (!) 97.5 F (36.4 C) 97.8 F (36.6 C) 98 F (36.7 C)  TempSrc: Oral Oral Oral Oral  SpO2: 97% 100% 100% 99%  Weight:      Height:        Intake/Output Summary (Last 24 hours) at 06/17/2024 1604 Last data filed at 06/16/2024 1700 Gross per 24 hour  Intake 240 ml  Output --  Net 240 ml   Filed Weights   06/12/24 1132 06/16/24 0619  Weight: 72.6 kg 74 kg    Examination:  Physical Exam Constitutional:      General: She is not in acute distress. Cardiovascular:     Rate and Rhythm: Normal rate and regular rhythm.  Pulmonary:     Effort: Pulmonary effort is normal.     Breath sounds: Normal breath sounds.  Musculoskeletal:     Right lower leg: No edema.     Left lower leg: No edema.  Neurological:     Mental Status: She is alert. Mental status is at baseline. She is disoriented.  Psychiatric:        Mood and Affect: Mood normal.        Behavior: Behavior normal.          Scheduled Medications:   aspirin  EC  81 mg Oral Daily   clopidogrel   75 mg Oral Daily   docusate sodium   200 mg Oral BID   enoxaparin  (LOVENOX ) injection  40 mg Subcutaneous Q24H   feeding supplement  237 mL Oral BID BM   loratadine   10 mg Oral Daily   polyethylene glycol  17 g Oral Daily   rosuvastatin   40 mg Oral Daily   sodium chloride  flush  3 mL Intravenous Once     Continuous Infusions:   PRN Medications:  acetaminophen  **OR** acetaminophen  (TYLENOL ) oral liquid 160 mg/5 mL **OR** acetaminophen , bisacodyl , hydrALAZINE , LORazepam , morphine  injection, OLANZapine , sodium phosphate   Antimicrobials from admission:  Anti-infectives (From admission, onward)    None           Data Reviewed:  I have personally reviewed the following...  CBC: Recent Labs  Lab 06/12/24 1136 06/14/24 0358  WBC 9.3 11.3*  NEUTROABS 5.5  --   HGB 11.9* 13.3  HCT 37.0 41.6  MCV 96.6 96.5  PLT 265 303   Basic Metabolic Panel: Recent Labs  Lab 06/12/24 1136 06/14/24 0358  NA 139 140  K 4.0 4.1  CL 104 105  CO2 27 26  GLUCOSE 94 134*  BUN 25* 37*  CREATININE 1.06* 1.22*  CALCIUM  9.2 9.4   GFR:  Estimated Creatinine Clearance: 36.5 mL/min (A) (by C-G formula based on SCr of 1.22 mg/dL (H)). Liver Function Tests: Recent Labs  Lab 06/12/24 1136  AST 20  ALT 15  ALKPHOS 63  BILITOT 1.0  PROT 7.7  ALBUMIN 3.7   No results for input(s): LIPASE, AMYLASE in the last 168 hours. No results for input(s): AMMONIA in the last 168 hours. Coagulation Profile: Recent Labs  Lab 06/12/24 1136  INR 1.0   Cardiac Enzymes: Recent Labs  Lab 06/13/24 1748  CKTOTAL 115   BNP (last 3 results) No results for input(s): PROBNP in the last 8760 hours. HbA1C: No results for input(s): HGBA1C in the last 72 hours.  CBG: Recent Labs  Lab 06/12/24 1138  GLUCAP 102*   Lipid Profile: No results for input(s): CHOL, HDL, LDLCALC, TRIG, CHOLHDL, LDLDIRECT in the last 72 hours.  Thyroid Function Tests: No results for input(s): TSH, T4TOTAL, FREET4, T3FREE, THYROIDAB in the last 72 hours. Anemia Panel: No results for input(s): VITAMINB12, FOLATE, FERRITIN, TIBC, IRON, RETICCTPCT in the last 72 hours. Most Recent Urinalysis On File:     Component Value Date/Time   COLORURINE STRAW (A) 06/12/2024 1206    APPEARANCEUR CLEAR (A) 06/12/2024 1206   APPEARANCEUR Clear 09/24/2014 1532   LABSPEC 1.004 (L) 06/12/2024 1206   LABSPEC 1.004 09/24/2014 1532   PHURINE 6.0 06/12/2024 1206   GLUCOSEU NEGATIVE 06/12/2024 1206   GLUCOSEU Negative 09/24/2014 1532   HGBUR NEGATIVE 06/12/2024 1206   BILIRUBINUR NEGATIVE 06/12/2024 1206   BILIRUBINUR Negative 09/24/2014 1532   KETONESUR NEGATIVE 06/12/2024 1206   PROTEINUR NEGATIVE 06/12/2024 1206   UROBILINOGEN 0.2 10/21/2014 1259   NITRITE NEGATIVE 06/12/2024 1206   LEUKOCYTESUR NEGATIVE 06/12/2024 1206   LEUKOCYTESUR Negative 09/24/2014 1532   Sepsis Labs: @LABRCNTIP (procalcitonin:4,lacticidven:4) Microbiology: No results found for this or any previous visit (from the past 240 hours).    Radiology Studies last 3 days: US  Carotid Bilateral Result Date: 06/13/2024 CLINICAL DATA:  Thrombosis. EXAM: BILATERAL CAROTID DUPLEX ULTRASOUND TECHNIQUE: Elnor scale imaging, color Doppler and duplex ultrasound were performed of bilateral carotid and vertebral arteries in the neck. COMPARISON:  Neck CTA 06/12/2024 FINDINGS: Criteria: Quantification of carotid stenosis is based on velocity parameters that correlate the residual internal carotid diameter with NASCET-based stenosis levels, using the diameter of the distal internal carotid lumen as the denominator for stenosis measurement. The following velocity measurements were obtained: RIGHT ICA: 241 cm/sec CCA: 77 cm/sec SYSTOLIC ICA/CCA RATIO:  3.1 ECA: 259 cm/sec LEFT ICA: 160 cm/sec Bulb: 213 cm/sec CCA: 77 cm/sec SYSTOLIC ICA/CCA RATIO:  2.1 ECA: 287 cm/sec RIGHT CAROTID ARTERY: Irregular echogenic plaque with shadowing at the right carotid bulb. Elevated peak systolic velocity in the proximal external carotid artery compatible with stenosis near the origin. This finding corresponds with the recent CTA findings. Elevated peak systolic velocity in the proximal right internal carotid artery measuring 241 cm/sec. Mid and  distal right internal carotid artery are patent. RIGHT VERTEBRAL ARTERY: Antegrade flow and normal waveform in the right vertebral artery. LEFT CAROTID ARTERY: Echogenic plaque at the left carotid bulb. Elevated peak systolic velocity at the left carotid bulb measuring 214 cm/sec. Elevated peak systolic velocity in the left external carotid artery. Echogenic plaque in the proximal internal carotid artery. Peak systolic velocity in the proximal internal carotid artery is 160 cm/sec. Mid and distal left internal carotid artery are patent. LEFT VERTEBRAL ARTERY: Antegrade flow and normal waveform in the left vertebral artery. IMPRESSION: 1. Atherosclerotic plaque involving bilateral carotid arteries.  2. Estimated degree of stenosis in the right internal carotid artery is greater than 70%. 3. Estimated degree of stenosis at the left carotid bulb and proximal left internal carotid artery is 50-69%. 4. Stenosis involving bilateral external carotid arteries. 5. Patent vertebral arteries bilaterally. Electronically Signed   By: Juliene Balder M.D.   On: 06/13/2024 14:41   ECHOCARDIOGRAM COMPLETE Result Date: 06/13/2024    ECHOCARDIOGRAM REPORT   Patient Name:   Lori Wallace Date of Exam: 06/12/2024 Medical Rec #:  [id number]      Height:       70.0 in Accession #:    [id number]     Weight:       160.0 lb Date of Birth:  1936/09/06       BSA:          1.898 m Patient Age:    87 years       BP:           113/63 mmHg Patient Gender: F              HR:           60 bpm. Exam Location:  [facility] Procedure: 2D Echo, Cardiac Doppler and Color Doppler (Both Spectral and Color            Flow Doppler were utilized during procedure). Indications:     G45.9 TIA  History:         Patient has no prior history of Echocardiogram examinations.                  Risk Factors:Hypertension.  Sonographer:     Carl Coma RDCS Referring Phys:  [id number] CORT ONEIDA MANA Diagnosing Phys: Evalene Lunger MD IMPRESSIONS  1. Left ventricular ejection  fraction, by estimation, is 60 to 65%. The left ventricle has normal function. The left ventricle has no regional wall motion abnormalities. Left ventricular diastolic parameters are consistent with Grade I diastolic dysfunction (impaired relaxation).  2. Right ventricular systolic function is normal. The right ventricular size is normal. Tricuspid regurgitation signal is inadequate for assessing PA pressure.  3. The mitral valve is normal in structure. No evidence of mitral valve regurgitation. No evidence of mitral stenosis.  4. The aortic valve is normal in structure. Aortic valve regurgitation is not visualized. Aortic valve sclerosis/calcification is present, without any evidence of aortic stenosis.  5. The inferior vena cava is normal in size with greater than 50% respiratory variability, suggesting right atrial pressure of 3 mmHg. FINDINGS  Left Ventricle: Left ventricular ejection fraction, by estimation, is 60 to 65%. The left ventricle has normal function. The left ventricle has no regional wall motion abnormalities. Strain was performed and the global longitudinal strain is indeterminate. The left ventricular internal cavity size was normal in size. There is no left ventricular hypertrophy. Left ventricular diastolic parameters are consistent with Grade I diastolic dysfunction (impaired relaxation). Right Ventricle: The right ventricular size is normal. No increase in right ventricular wall thickness. Right ventricular systolic function is normal. Tricuspid regurgitation signal is inadequate for assessing PA pressure. Left Atrium: Left atrial size was normal in size. Right Atrium: Right atrial size was normal in size. Pericardium: There is no evidence of pericardial effusion. Mitral Valve: The mitral valve is normal in structure. Mild mitral annular calcification. No evidence of mitral valve regurgitation. No evidence of mitral valve stenosis. Tricuspid Valve: The tricuspid valve is normal in structure.  Tricuspid valve regurgitation is mild . No evidence of  tricuspid stenosis. Aortic Valve: The aortic valve is normal in structure. Aortic valve regurgitation is not visualized. Aortic valve sclerosis/calcification is present, without any evidence of aortic stenosis. Pulmonic Valve: The pulmonic valve was normal in structure. Pulmonic valve regurgitation is not visualized. No evidence of pulmonic stenosis. Aorta: The aortic root is normal in size and structure. Venous: The inferior vena cava is normal in size with greater than 50% respiratory variability, suggesting right atrial pressure of 3 mmHg. IAS/Shunts: No atrial level shunt detected by color flow Doppler. Additional Comments: 3D was performed not requiring image post processing on an independent workstation and was indeterminate.  LEFT VENTRICLE PLAX 2D LVIDd:         3.90 cm   Diastology LVIDs:         2.20 cm   LV e' medial:    5.71 cm/s LV PW:         1.00 cm   LV E/e' medial:  16.1 LV IVS:        1.00 cm   LV e' lateral:   5.33 cm/s LVOT diam:     2.00 cm   LV E/e' lateral: 17.2 LV SV:         88 LV SV Index:   46 LVOT Area:     3.14 cm  RIGHT VENTRICLE RV Basal diam:  3.20 cm RV S prime:     15.30 cm/s TAPSE (M-mode): 2.8 cm LEFT ATRIUM             Index        RIGHT ATRIUM          Index LA diam:        3.50 cm 1.84 cm/m   RA Area:     8.71 cm LA Vol (A2C):   30.3 ml 15.96 ml/m  RA Volume:   19.70 ml 10.38 ml/m LA Vol (A4C):   28.2 ml 14.85 ml/m LA Biplane Vol: 30.6 ml 16.12 ml/m  AORTIC VALVE LVOT Vmax:   120.50 cm/s LVOT Vmean:  77.750 cm/s LVOT VTI:    0.280 m  AORTA Ao Root diam: 3.30 cm Ao Asc diam:  3.40 cm MITRAL VALVE MV Area (PHT): 2.48 cm     SHUNTS MV Decel Time: 306 msec     Systemic VTI:  0.28 m MV E velocity: 91.70 cm/s   Systemic Diam: 2.00 cm MV A velocity: 129.50 cm/s MV E/A ratio:  0.71 Evalene Lunger MD Electronically signed by Evalene Lunger MD Signature Date/Time: 06/13/2024/2:14:35 PM    Final    MR BRAIN WO CONTRAST Result  Date: 06/12/2024 EXAM: MRI BRAIN WITHOUT CONTRAST 06/12/2024 04:28:07 PM TECHNIQUE: Multiplanar multisequence MRI of the head/brain was performed without the administration of intravenous contrast. Best obtainable images due to patient becoming claustrophobic and refusing to continue exam. Axial and coronal DWI and ADC images were obtained. These images are noted by motion artifact. COMPARISON: Same day CTA head and neck. CLINICAL HISTORY: Transient ischemic attack (TIA). FINDINGS: BRAIN AND VENTRICLES: There are areas of acute infarct within the right frontal lobe involving the corona radiata and periventricular white matter. There is periventricular volume loss and findings suggestive of chronic microvascular ischemic changes. No midline shift. No hydrocephalus. IMPRESSION: 1. Patient unable to complete exam due to claustrophobia. Limited DWI and ADC images obtained. Motion artifact. 2. Within these limitations, acute infarcts noted within the right frontal lobe involving the corona radiata and periventricular white matter. Electronically signed by: Donnice Mania MD 06/12/2024 05:20 PM  EDT RP Workstation: [id number]   CT ANGIO HEAD NECK W WO CM Addendum Date: 06/12/2024 ~~~~ADDENDUM #1 ~~~~ EXAM: CT HEAD WITHOUT CTA HEAD AND NECK WITH AND WITHOUT 06/12/2024 04:10:00 PM TECHNIQUE: CTA of the head and neck was performed with and without the administration of intravenous contrast. Noncontrast CT of the head with reconstructed 2-D images are also provided for review. Multiplanar 2D and/or 3D reformatted images are provided for review. Automated exposure control, iterative reconstruction, and/or weight based adjustment of the mA/kV was utilized to reduce the radiation dose to as low as reasonably achievable. COMPARISON: CT head earlier same day as well as carotid ultrasound dated 07/01/2024. CLINICAL HISTORY: Stroke/TIA, assess for intracardiac shunt. Pt presents with R side weakness, aphasia that started 8/4. She did  have a fall at that time and has been having L hip pain. Pt saw her PCP yesterday and was started on ASA. FINDINGS: CT HEAD: BRAIN AND VENTRICLES: No acute intracranial hemorrhage. No mass effect. No midline shift. No extra-axial fluid collection. Gray-white differentiation is maintained. No hydrocephalus. ORBITS: No acute abnormality. SINUSES AND MASTOIDS: No acute abnormality. CTA NECK: AORTIC ARCH AND ARCH VESSELS: Mixed atherosclerotic plaque within the visualized aortic arch. Atherosclerosis involves the origin of the left subclavian artery without high-grade stenosis. CERVICAL CAROTID ARTERIES: The right carotid artery is patent from the origin to the skull base. Mixed attenuation atherosclerotic plaque at the carotid bifurcation with prominent noncalcified component extending to the carotid bulb. There is approximately 75% stenosis at the portion of the right cervical ICA within the proximal right cervical ICA. There is intraluminal soft tissue concerning for focal intraluminal prominence visualized on series 6 image 229 and on series 8 image 57. Intraluminal thrombus is associated with approximately 50% luminal narrowing. Additional mixed atherosclerotic plaque in the distal right cervical ICA without hemodynamically significant stenosis. There is bulky calcified atherosclerosis at the left carotid bifurcation resulting in approximately 60% stenosis of the origin of the left cervical ICA. CERVICAL VERTEBRAL ARTERIES: Patent vertebral arteries in the neck. LUNGS AND MEDIASTINUM: Biapical pleural parenchymal scarring. SOFT TISSUES: No acute abnormality. BONES: Degenerative changes in the visualized spine. There is fusion of the C5 and C6 vertebral bodies. CTA HEAD: ANTERIOR CIRCULATION: The intracranial internal carotid arteries are patent bilaterally. The MCAs are patent bilaterally. The ACAs are patent bilaterally. 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 POSTERIOR  CIRCULATION: Diminutive appearance of the basilar artery with significant distal tapering of the basilar artery after the origin of the superior cerebellar arteries. The PCAs are patent bilaterally and are primarily supplied by the anterior circulation. OTHER: No dural venous sinus thrombosis on this non-dedicated study. IMPRESSION: 1. Mixed atherosclerotic plaque at the right carotid bifurcation resulting in approximately 75% stenosis of the right cervical ICA origin. Focal intraluminal soft tissue in the proximal right cervical ICA concerning for intraluminal thrombus with approximately 50% luminal narrowing. 2. Approximately 60% stenosis of the origin of the left cervical ICA due to calcified atherosclerosis. 3. Additional atherosclerosis as above. 4. Findings suggestive of vertebrobasilar hypoplasia. 5. Impression point #1 discussed with Dr. Laurita at 4:42PM on 06/12/24. Electronically signed by: Donnice Mania MD 06/12/2024 04:55 PM EDT RP Workstation: [id number]   Result Date: 06/12/2024 ~~~~ORIGINAL REPORT ~~~~ EXAM: CT HEAD WITHOUT CTA HEAD AND NECK WITH AND WITHOUT 06/12/2024 04:10:00 PM TECHNIQUE: CTA of the head and neck was performed with and without the administration of intravenous contrast. Noncontrast CT of the head with reconstructed 2-D images are also provided for  review. Multiplanar 2D and/or 3D reformatted images are provided for review. Automated exposure control, iterative reconstruction, and/or weight based adjustment of the mA/kV was utilized to reduce the radiation dose to as low as reasonably achievable. COMPARISON: CT head earlier same day as well as carotid ultrasound dated 07/01/2024. CLINICAL HISTORY: Stroke/TIA, assess for intracardiac shunt. Pt presents with R side weakness, aphasia that started 8/4. She did have a fall at that time and has been having L hip pain. Pt saw her PCP yesterday and was started on ASA. FINDINGS: CT HEAD: BRAIN AND VENTRICLES: No acute intracranial hemorrhage.  No mass effect. No midline shift. No extra-axial fluid collection. Gray-white differentiation is maintained. No hydrocephalus. ORBITS: No acute abnormality. SINUSES AND MASTOIDS: No acute abnormality. CTA NECK: AORTIC ARCH AND ARCH VESSELS: Mixed atherosclerotic plaque within the visualized aortic arch. Atherosclerosis involves the origin of the left subclavian artery without high-grade stenosis. CERVICAL CAROTID ARTERIES: The right carotid artery is patent from the origin to the skull base. Mixed attenuation atherosclerotic plaque at the carotid bifurcation with prominent noncalcified component extending to the carotid bulb. There is approximately 75% stenosis at the portion of the right cervical ICA within the proximal right cervical ICA. There is intraluminal soft tissue concerning for focal intraluminal prominence visualized on series 6 image 229 and on series 8 image 57. Intraluminal thrombus is associated with approximately 50% luminal narrowing. Additional mixed atherosclerotic plaque in the distal right cervical ICA without hemodynamically significant stenosis. There is bulky calcified atherosclerosis at the left carotid bifurcation resulting in approximately 60% stenosis of the origin of the left cervical ICA. CERVICAL VERTEBRAL ARTERIES: Patent vertebral arteries in the neck. LUNGS AND MEDIASTINUM: Biapical pleural parenchymal scarring. SOFT TISSUES: No acute abnormality. BONES: Degenerative changes in the visualized spine. There is fusion of the C5 and C6 vertebral bodies. CTA HEAD: ANTERIOR CIRCULATION: The intracranial internal carotid arteries are patent bilaterally. The MCAs are patent bilaterally. The ACAs are patent bilaterally. 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 POSTERIOR CIRCULATION: Diminutive appearance of the basilar artery with significant distal tapering of the basilar artery after the origin of the superior cerebellar arteries. The PCAs are patent  bilaterally and are primarily supplied by the anterior circulation. OTHER: No dural venous sinus thrombosis on this non-dedicated study. IMPRESSION: 1. Mixed atherosclerotic plaque at the right carotid bifurcation resulting in approximately 75% stenosis of the right cervical ICA origin. Focal intraluminal soft tissue in the proximal right cervical ICA concerning for intraluminal thrombus with approximately 50% luminal narrowing. 2. Approximately 60% stenosis of the origin of the left cervical ICA due to calcified atherosclerosis. 3. Additional atherosclerosis as above. 4. Findings suggestive of vertebrobasilar hypoplasia. Electronically signed by: Donnice Mania MD 06/12/2024 04:40 PM EDT RP Workstation: [id number]   DG Hip Unilat With Pelvis 2-3 Views Left Result Date: 06/12/2024 EXAM: 2 or 3 VIEW(S) XRAY OF THE PELVIS AND LEFT HIP 06/12/2024 12:45:46 PM COMPARISON: 04/25/2021 CLINICAL HISTORY: Fall. FINDINGS: JOINTS: Postoperative changes from previous left total hip arthroplasty. No signs of acute fracture or dislocation. Chronic left superior and inferior pubic rami fractures identified. Degenerative changes noted within the right hip. 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 SOFT TISSUES: Mild osteopenia. IMPRESSION: 1. No acute fracture or dislocation of the left hip. 2. Chronic left superior and inferior pubic rami fractures. 3. Degenerative changes in the right hip. 4. 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 Electronically signed by:  Waddell Calk MD 06/12/2024 02:04 PM EDT RP Workstation: [id number]   CT HEAD WO CONTRAST Result Date: 06/12/2024 EXAM: CT HEAD WITHOUT CONTRAST 06/12/2024 12:24:12 PM TECHNIQUE: CT of the head was performed without the administration of intravenous contrast. Automated exposure control, iterative reconstruction, and/or weight based adjustment of the mA/kV was utilized to reduce the radiation dose to as low  as reasonably achievable. COMPARISON: 07/13/2021 CLINICAL HISTORY: Neuro deficit, acute, stroke suspected. Pt presents with R side weakness, aphasia that started 8/4. She did have a fall at that time and has been having L hip pain. Pt saw her PCP yesterday and was started on ASA. Pt continued to have progressive worsening of weakness and was having difficulty feeding herself last night. Pt is A/Ox4 during triage with slow slurred speech noted. FINDINGS: BRAIN AND VENTRICLES: No acute hemorrhage. Gray-white differentiation is preserved. No hydrocephalus. No extra-axial collection. No mass effect or midline shift. Nonspecific hypoattenuation in the periventricular and subcortical white matter, most likely representing chronic small vessel disease. Generalized parenchymal volume loss. ORBITS: No acute abnormality. SINUSES: No acute abnormality. SOFT TISSUES AND SKULL: No acute soft tissue abnormality. No skull fracture. VASCULATURE: Atherosclerosis in the carotid siphons. IMPRESSION: 1. No acute intracranial abnormality. 2. Nonspecific hypoattenuation in the periventricular and subcortical white matter, most likely representing chronic small vessel disease. Electronically signed by: Donnice Mania MD 06/12/2024 01:05 PM EDT RP Workstation: [id number]          Laneta Blunt, DO [facility] 06/17/2024, 4:04 PM    Dictation software may have been used to generate the above note. Typos may occur and escape review in typed/dictated notes. Please contact Dr Blunt directly for clarity if needed.  Staff may message me via secure chat in Epic  but this may not receive an immediate response,  please page me for urgent matters!  If 7PM-7AM, please contact night coverage [email or web address]

## 2024-06-18 DIAGNOSIS — G459 Transient cerebral ischemic attack, unspecified: Secondary | ICD-10-CM | POA: Diagnosis not present

## 2024-06-18 LAB — BASIC METABOLIC PANEL WITH GFR
Anion gap: 8 (ref 5–15)
BUN: 33 mg/dL — ABNORMAL HIGH (ref 8–23)
CO2: 24 mmol/L (ref 22–32)
Calcium: 8.9 mg/dL (ref 8.9–10.3)
Chloride: 107 mmol/L (ref 98–111)
Creatinine, Ser: 1.04 mg/dL — ABNORMAL HIGH (ref 0.44–1.00)
GFR, Estimated: 53 mL/min — ABNORMAL LOW (ref >60)
Glucose, Bld: 130 mg/dL — ABNORMAL HIGH (ref 70–99)
Potassium: 3.9 mmol/L (ref 3.5–5.1)
Sodium: 139 mmol/L (ref 135–145)

## 2024-06-18 LAB — CBC
HCT: 37.6 % (ref 36.0–46.0)
Hemoglobin: 12.1 g/dL (ref 12.0–15.0)
MCH: 30.9 pg (ref 26.0–34.0)
MCHC: 32.2 g/dL (ref 30.0–36.0)
MCV: 95.9 fL (ref 80.0–100.0)
Platelets: 290 K/uL (ref 150–400)
RBC: 3.92 MIL/uL (ref 3.87–5.11)
RDW: 13.3 % (ref 11.5–15.5)
WBC: 10.6 K/uL — ABNORMAL HIGH (ref 4.0–10.5)
nRBC: 0 % (ref 0.0–0.2)

## 2024-06-18 MED ORDER — ASPIRIN 81 MG PO TBEC: 81.0000 mg | DELAYED_RELEASE_TABLET | Freq: Every day | ORAL | Status: AC

## 2024-06-18 MED ORDER — DIPHENHYDRAMINE HCL 25 MG PO CAPS: 25.0000 mg | ORAL_CAPSULE | Freq: Three times a day (TID) | ORAL | Status: DC | PRN

## 2024-06-18 MED ORDER — ACETAMINOPHEN 325 MG PO TABS: 650.0000 mg | ORAL_TABLET | ORAL | Status: DC | PRN

## 2024-06-18 MED ORDER — ROSUVASTATIN CALCIUM 40 MG PO TABS: 40.0000 mg | ORAL_TABLET | Freq: Every day | ORAL | Status: AC

## 2024-06-18 MED ORDER — ENSURE PLUS HIGH PROTEIN PO LIQD: 237.0000 mL | Freq: Two times a day (BID) | ORAL | Status: AC

## 2024-06-18 MED ORDER — CLOPIDOGREL BISULFATE 75 MG PO TABS
75.0000 mg | ORAL_TABLET | Freq: Every day | ORAL | Status: AC
Start: 2024-06-18 — End: ?

## 2024-06-18 MED ORDER — BISACODYL 10 MG RE SUPP
10.0000 mg | Freq: Every day | RECTAL | Status: AC | PRN
Start: 2024-06-18 — End: ?

## 2024-06-18 NOTE — Progress Notes (Signed)
   06/18/24 1600  Spiritual Encounters  Type of Visit Follow up  Care provided to: Pt and family  Conversation partners present during encounter Social worker/Care management/TOC  Reason for visit Routine spiritual support  OnCall Visit No  Spiritual Framework  Presenting Themes Caregiving needs  Community/Connection Family  Interventions  Spiritual Care Interventions Made Compassionate presence  Intervention Outcomes  Outcomes Awareness of support   Chaplain followed up on patient and family, but patient still was not lucid to complete a AD. Chaplain encouraged family while news was given about the plan of what is next. Family wants patient to stay longer because they feel patient needs more care, but hospital is saying it has done all it can and it is time to release patient. Family ask Chaplain to keep them in prayer.

## 2024-06-18 NOTE — Progress Notes (Signed)
 Received report, checked in with patient at bedside. Patient denies any needs at this time. Assessment to follow.

## 2024-06-18 NOTE — TOC Progression Note (Addendum)
 Transition of Care [facility]) - Progression Note    Patient Details  Name: Lori Wallace MRN: [id number] Date of Birth: [birth year]  Transition of Care [facility]) CM/SW Contact  [name], RN Phone Number: 06/18/2024, 8:21 AM  Clinical Narrative:    Late entry: 8/11: Bed offers received for the patient's 1st choice, [facility], and 2nd choice [facility]. TOC called to [facility] to accept the bed and was advised they wont have a bed until Thursday. TOC outreached to the patient's niece [name] and advised of the offers. TOC explained that the patient is medically ready for discharge and will need a bed prior to Thursday. [name] verbalized she is in agreement with TOC requested auth for [facility].  Auth receivedBETHA Shara BALBOA [id number] 06/17/24-06/19/24  TOC received a message from the patient's nurse advising the patient's other niece [name] and [name] are not in agreement with [facility].   8/12 :TOC met with [name] in the room and she is adamant the patient is not medically ready for discharge. TOC explained that her role is to ensure there is a safe discharge plan and that discharge orders typically come from the MD. TOC encouraged the family to share discharge concerns with the MD.   TOC received message from patient experience requesting that [facility] call [name]. Insurance advised [name] they have not received a request for rehab, but for SNF. TOC explained that insurance uses the term rehab differently than we do. We refer to SNF as short term rehab. When insurance uses the term rehab they are referring to Acute Inpatient Rehab which is a higher level of care.   TOC visited the patients room and provided [name] with a copy of the IM Letter and highlighted the number to call to initiate the appeal of the discharge. TOC explained that the Medicare Representative will reach out to [facility] to request additional information once the appeal is initiated.                      Expected Discharge Plan and Services                                               Social Drivers of Health (SDOH) Interventions SDOH Screenings   Food Insecurity: No Food Insecurity (06/12/2024)  Housing: Unknown (06/12/2024)  Transportation Needs: No Transportation Needs (06/12/2024)  Utilities: Not At Risk (06/12/2024)  Financial Resource Strain: Low Risk  (09/28/2023)   Received from [facility]  Social Connections: Unknown (06/12/2024)  Tobacco Use: Medium Risk (06/12/2024)    Readmission Risk Interventions     No data to display

## 2024-06-18 NOTE — Progress Notes (Signed)
 Family (Daughter) voiced concerns about discharge today due to patient's orientation level and increased confusion. She is requesting to speak to Case Manager to hopefully resolve situation.

## 2024-06-18 NOTE — Progress Notes (Signed)
   06/18/24 0900  Spiritual Encounters  Type of Visit Follow up  Care provided to: Family  Reason for visit Advance directives  OnCall Visit No  Interventions  Spiritual Care Interventions Made Compassionate presence;Encouragement  Intervention Outcomes  Outcomes Connection to spiritual care  Advance Directives (For Healthcare)  Does Patient Have a Medical Advance Directive? No   Chaplain followed up with family on AD, but family member stated that patient is not lucid today. Family said maybe try back later.

## 2024-06-18 NOTE — Progress Notes (Signed)
 Mobility Specialist - Progress Note   06/18/24 1130  Mobility  Activity Mechanically lifted from bed to chair;Stood at bedside  Level of Assistance Moderate assist, patient does 50-74%  Assistive Device Stedy  Activity Response Tolerated well  Mobility visit 1 Mobility  Mobility Specialist Start Time (ACUTE ONLY) 1050  Mobility Specialist Stop Time (ACUTE ONLY) 1057  Mobility Specialist Time Calculation (min) (ACUTE ONLY) 7 min   Pt sitting on the BSC upon entry, utilizing RA. Pt STS w/ steady Mod-MinA +2, stood for ~ 2 mins while NT completed peri care before returning seated. Pt wheeled around the bed towards the recliner. Pt STS MinA +2, sat in the recliner and left with needs within reach. Family member and NT present at bedside.  America Silvan Mobility Specialist 06/18/24 11:33 AM

## 2024-06-18 NOTE — Plan of Care (Signed)

## 2024-06-18 NOTE — Discharge Summary (Signed)
 Physician Discharge Summary   Patient: Lori Wallace MRN: [id number]  DOB: October 12, 1936   Admit:     Date of Admission: 06/12/2024 Admitted from: home   Discharge: Date of discharge: 06/18/24 dc orders placed  Disposition: Skilled nursing facility / rehab  Condition at discharge: good  CODE STATUS: DNR     Discharge Physician: Laneta Blunt, DO [facility]     PCP: [name], MD  Recommendations for Outpatient Follow-up:  Follow up with PCP [name], MD in 2-4 weeks Follow as directed with neurology  Follow with vascular surgery if desired endarterectomy        Discharge Diagnoses: Principal Problem:   TIA (transient ischemic attack) Active Problems:   CVA (cerebral vascular accident) [facility] [address])      Hospital course / significant events:   HPI: Lori Wallace is a 88 y.o. female with medical history significant of HTN, skin cancer, breast cancer status post bilateral lumpectomy, presented with strokelike symptoms, including recent L sided weakness, fall w/o LOC, slurred speech. Went to see PCP, suspected TIA, startes on ASA. Pt continues to have speech difficulty and swallowing difficulty, memory problems. Family brought pt to ED 08/06 08/06: hypertensive, bradycardic. Unremarkable CBC, CMP, UA. Admitted to hospitalist for TIA/CVA w/u. Imaging: CTA H/N (+)75% narrowing on the right carotid bifurcation site with signs of distal intramural thrombosis formation. Admitting hospitalist D/W oncall neurology Dr. Lindzen - he recommended carotid doppler to confirm CTA finding and permissive HTN, will see patient in AM to decide next step. MRI brain unable to complete d/t claustrophobia and motion artifact but despite these limitations MRI did reveal acute infarcts noted within the right frontal lobe involving the corona radiata and periventricular white matter 08/07: Carotid US  today, confirmed stenosis, no mention of thrombus. Vascular surgery consult. Echo  EF 60-65, Grade 1 diast df.  08/08: vascular surgery consult - family is leaning toward nonsurgical management but is discussing further before final decision. Await SNF placement for rehab  08/09: family brings MOST form - pt has indicated previously for DNR so orders updated. Pt also indicated limited interventions, was not specific about surgeries but family is leaning toward no endarterectomy / discuss outpatient and treat medically for now.  08/10: awaiting SNF placement thru weekend  08/11: placement pending auth  - auth for [facility], family wants [facility].  08/12: Pt is medically stable. Hospitalist placed dc orders and dispo per [facility] / family / insurance.      Consultants:  Neurology  Vascular surgery   Procedures/Surgeries: none      ASSESSMENT & PLAN:   Acute CVA  Acute left paresis, resolved Acute aphasia, resolved Acute dysphagia ASA, statin  If decide not to have CEA, add Plavix  to ASA and continue DAPT indefinitely. Started Plavix  now PT OT and speech evaluation - rehab pending    CTA H/N clinically significant findings of 75% narrowing on the right carotid bifurcation site with signs of distal intramural thrombosis formation.  ASA Consult to vascular re carotid stenosis --> risk of surgery in light of pt's previously known preferences for no aggressive measures, family is leaning toward no surgery If decide not to have CEA, add Plavix  to ASA and continue DAPT indefinitely. Started Plavix  now  HTN Held atenolol  for permissive HTN, also bradycardic. HR and BP stable now will adjust meds as needed    Bradycardia Likely beta blocker effect Hold atenolol   Fall at home Acute on chronic ambulation impairment No fracture or dislocation  on left hip PT OT evaluation   Chronic normocytic anemia H&H stable, no symptoms or signs of active bleeding Monitor CBC  Depression Question early/mild dementia Hospital delirium Holding buproprion  Restart mirtazapine  given insomnia Zyprexa  prn severe agitation as this tends to have less risk confusion in elderly, avoid Haldol - per neurology  Required sitter briefly, order d/c 06/16/24 8:38 AM   Constipation  Bowel regimen per orders   No concerns based on BMI: Body mass index is 22.96 kg/m.[name] Significantly low or high BMI is associated with higher medical risk.  Underweight - under 18  overweight - 25 to 29 obese - 30 or more Class 1 obesity: BMI of 30.0 to 34 Class 2 obesity: BMI of 35.0 to 39 Class 3 obesity: BMI of 40.0 to 49 Super Morbid Obesity: BMI 50-59 Super-super Morbid Obesity: BMI 60+ Healthy nutrition and physical activity advised as adjunct to other disease management and risk reduction treatments       Discharge Instructions  Allergies as of 06/18/2024       Reactions   Demerol [meperidine] Nausea And Vomiting   Propoxyphene Nausea And Vomiting   Tape Itching        Medication List     STOP taking these medications    atenolol  25 MG tablet Commonly known as: TENORMIN    ibuprofen  200 MG tablet Commonly known as: ADVIL    PROBIOTIC DAILY PO       TAKE these medications    acetaminophen  325 MG tablet Commonly known as: TYLENOL  Take 2 tablets (650 mg total) by mouth every 4 (four) hours as needed for mild pain (pain score 1-3) or fever (or temp > 37.5 C (99.5 F)).   aspirin  EC 81 MG tablet Take 1 tablet (81 mg total) by mouth daily. Swallow whole.   bisacodyl  10 MG suppository Commonly known as: DULCOLAX Place 1 suppository (10 mg total) rectally daily as needed for moderate constipation.   buPROPion 150 MG 24 hr tablet Commonly known as: WELLBUTRIN XL Take 150 mg by mouth daily.   CALCIUM  MAGNESIUM  PO Take 1 tablet by mouth daily.   cholecalciferol  1000 units tablet Commonly known as: VITAMIN D  Take 1,000 Units by mouth daily.   clopidogrel  75 MG tablet Commonly known as: PLAVIX  Take 1 tablet (75 mg total) by mouth daily.   diphenhydrAMINE   25 mg capsule Commonly known as: BENADRYL  Take 1 capsule (25 mg total) by mouth every 8 (eight) hours as needed for itching.   feeding supplement Liqd Take 237 mLs by mouth 2 (two) times daily between meals.   loratadine  10 MG tablet Commonly known as: CLARITIN  Take 10 mg by mouth daily as needed for allergies.   mirtazapine 15 MG tablet Commonly known as: REMERON Take 15 mg by mouth at bedtime.   REFRESH OP Place 1 drop into both eyes as needed (dry eyes).   rosuvastatin  40 MG tablet Commonly known as: CRESTOR  Take 1 tablet (40 mg total) by mouth daily.   sodium chloride  0.65 % Soln nasal spray Commonly known as: OCEAN Place 1 spray into both nostrils as needed for congestion.   Zinc  50 MG Tabs Take 50 mg by mouth daily.         Follow-up Information     [name], MD Follow up.   Specialty: Family Medicine Why: hospital follow up Contact information: [address] [facility] [place] [address] [phone]  Allergies  Allergen Reactions   Demerol [Meperidine] Nausea And Vomiting   Propoxyphene Nausea And Vomiting   Tape Itching     Subjective: pt has no complaints today, denies CP/SOB, denies pain, denies dysuria/frequency, had BM yesterday and no abd pain. Family has concerns about confusion.    Discharge Exam: BP (!) 161/71 (BP Location: Left Arm)   Pulse 87   Temp 98.1 F (36.7 C) (Oral)   Resp 16   Ht 5' 10 (1.778 m)   Wt 74 kg   SpO2 97%   BMI 23.41 kg/m  General: Pt is alert, awake, not in acute distress Cardiovascular: RRR, S1/S2 +, no rubs, no gallops Respiratory: CTA bilaterally, no wheezing, no rhonchi Abdominal: Soft, NT, ND, bowel sounds + Extremities: no edema, no cyanosis Neuro: pt oriented to year, person, not to place/situation. Remote memory is intact.      The results of significant diagnostics from this hospitalization (including imaging,  microbiology, ancillary and laboratory) are listed below for reference.     Microbiology: No results found for this or any previous visit (from the past 240 hours).   Labs: BNP (last 3 results) No results for input(s): BNP in the last 8760 hours. Basic Metabolic Panel: Recent Labs  Lab 06/12/24 1136 06/14/24 0358 06/18/24 1107  NA 139 140 139  K 4.0 4.1 3.9  CL 104 105 107  CO2 27 26 24   GLUCOSE 94 134* 130*  BUN 25* 37* 33*  CREATININE 1.06* 1.22* 1.04*  CALCIUM  9.2 9.4 8.9   Liver Function Tests: Recent Labs  Lab 06/12/24 1136  AST 20  ALT 15  ALKPHOS 63  BILITOT 1.0  PROT 7.7  ALBUMIN 3.7   No results for input(s): LIPASE, AMYLASE in the last 168 hours. No results for input(s): AMMONIA in the last 168 hours. CBC: Recent Labs  Lab 06/12/24 1136 06/14/24 0358 06/18/24 1107  WBC 9.3 11.3* 10.6*  NEUTROABS 5.5  --   --   HGB 11.9* 13.3 12.1  HCT 37.0 41.6 37.6  MCV 96.6 96.5 95.9  PLT 265 303 290   Cardiac Enzymes: Recent Labs  Lab 06/13/24 1748  CKTOTAL 115   BNP: Invalid input(s): POCBNP CBG: Recent Labs  Lab 06/12/24 1138  GLUCAP 102*   D-Dimer No results for input(s): DDIMER in the last 72 hours. Hgb A1c No results for input(s): HGBA1C in the last 72 hours. Lipid Profile No results for input(s): CHOL, HDL, LDLCALC, TRIG, CHOLHDL, LDLDIRECT in the last 72 hours. Thyroid function studies No results for input(s): TSH, T4TOTAL, T3FREE, THYROIDAB in the last 72 hours.  Invalid input(s): FREET3 Anemia work up No results for input(s): VITAMINB12, FOLATE, FERRITIN, TIBC, IRON, RETICCTPCT in the last 72 hours. Urinalysis    Component Value Date/Time   COLORURINE STRAW (A) 06/12/2024 1206   APPEARANCEUR CLEAR (A) 06/12/2024 1206   APPEARANCEUR Clear 09/24/2014 1532   LABSPEC 1.004 (L) 06/12/2024 1206   LABSPEC 1.004 09/24/2014 1532   PHURINE 6.0 06/12/2024 1206   GLUCOSEU NEGATIVE 06/12/2024  1206   GLUCOSEU Negative 09/24/2014 1532   HGBUR NEGATIVE 06/12/2024 1206   BILIRUBINUR NEGATIVE 06/12/2024 1206   BILIRUBINUR Negative 09/24/2014 1532   KETONESUR NEGATIVE 06/12/2024 1206   PROTEINUR NEGATIVE 06/12/2024 1206   UROBILINOGEN 0.2 10/21/2014 1259   NITRITE NEGATIVE 06/12/2024 1206   LEUKOCYTESUR NEGATIVE 06/12/2024 1206   LEUKOCYTESUR Negative 09/24/2014 1532   Sepsis Labs Recent Labs  Lab 06/12/24 1136 06/14/24 0358 06/18/24 1107  WBC 9.3 11.3* 10.6*  Microbiology No results found for this or any previous visit (from the past 240 hours). Imaging US  Carotid Bilateral Result Date: 06/13/2024 CLINICAL DATA:  Thrombosis. EXAM: BILATERAL CAROTID DUPLEX ULTRASOUND TECHNIQUE: Elnor scale imaging, color Doppler and duplex ultrasound were performed of bilateral carotid and vertebral arteries in the neck. COMPARISON:  Neck CTA 06/12/2024 FINDINGS: Criteria: Quantification of carotid stenosis is based on velocity parameters that correlate the residual internal carotid diameter with NASCET-based stenosis levels, using the diameter of the distal internal carotid lumen as the denominator for stenosis measurement. The following velocity measurements were obtained: RIGHT ICA: 241 cm/sec CCA: 77 cm/sec SYSTOLIC ICA/CCA RATIO:  3.1 ECA: 259 cm/sec LEFT ICA: 160 cm/sec Bulb: 213 cm/sec CCA: 77 cm/sec SYSTOLIC ICA/CCA RATIO:  2.1 ECA: 287 cm/sec RIGHT CAROTID ARTERY: Irregular echogenic plaque with shadowing at the right carotid bulb. Elevated peak systolic velocity in the proximal external carotid artery compatible with stenosis near the origin. This finding corresponds with the recent CTA findings. Elevated peak systolic velocity in the proximal right internal carotid artery measuring 241 cm/sec. Mid and distal right internal carotid artery are patent. RIGHT VERTEBRAL ARTERY: Antegrade flow and normal waveform in the right vertebral artery. LEFT CAROTID ARTERY: Echogenic plaque at the left  carotid bulb. Elevated peak systolic velocity at the left carotid bulb measuring 214 cm/sec. Elevated peak systolic velocity in the left external carotid artery. Echogenic plaque in the proximal internal carotid artery. Peak systolic velocity in the proximal internal carotid artery is 160 cm/sec. Mid and distal left internal carotid artery are patent. LEFT VERTEBRAL ARTERY: Antegrade flow and normal waveform in the left vertebral artery. IMPRESSION: 1. Atherosclerotic plaque involving bilateral carotid arteries. 2. Estimated degree of stenosis in the right internal carotid artery is greater than 70%. 3. Estimated degree of stenosis at the left carotid bulb and proximal left internal carotid artery is 50-69%. 4. Stenosis involving bilateral external carotid arteries. 5. Patent vertebral arteries bilaterally. Electronically Signed   By: Juliene Balder M.D.   On: 06/13/2024 14:41   ECHOCARDIOGRAM COMPLETE Result Date: 06/13/2024    ECHOCARDIOGRAM REPORT   Patient Name:   NAREH MATZKE Date of Exam: 06/12/2024 Medical Rec #:  [id number]      Height:       70.0 in Accession #:    [id number]     Weight:       160.0 lb Date of Birth:  01-08-1936       BSA:          1.898 m Patient Age:    87 years       BP:           113/63 mmHg Patient Gender: F              HR:           60 bpm. Exam Location:  [facility] Procedure: 2D Echo, Cardiac Doppler and Color Doppler (Both Spectral and Color            Flow Doppler were utilized during procedure). Indications:     G45.9 TIA  History:         Patient has no prior history of Echocardiogram examinations.                  Risk Factors:Hypertension.  Sonographer:     Carl Coma RDCS Referring Phys:  [id number] CORT ONEIDA MANA Diagnosing Phys: Evalene Lunger MD IMPRESSIONS  1. Left ventricular ejection fraction, by estimation, is 60 to 65%. The  left ventricle has normal function. The left ventricle has no regional wall motion abnormalities. Left ventricular diastolic parameters are  consistent with Grade I diastolic dysfunction (impaired relaxation).  2. Right ventricular systolic function is normal. The right ventricular size is normal. Tricuspid regurgitation signal is inadequate for assessing PA pressure.  3. The mitral valve is normal in structure. No evidence of mitral valve regurgitation. No evidence of mitral stenosis.  4. The aortic valve is normal in structure. Aortic valve regurgitation is not visualized. Aortic valve sclerosis/calcification is present, without any evidence of aortic stenosis.  5. The inferior vena cava is normal in size with greater than 50% respiratory variability, suggesting right atrial pressure of 3 mmHg. FINDINGS  Left Ventricle: Left ventricular ejection fraction, by estimation, is 60 to 65%. The left ventricle has normal function. The left ventricle has no regional wall motion abnormalities. Strain was performed and the global longitudinal strain is indeterminate. The left ventricular internal cavity size was normal in size. There is no left ventricular hypertrophy. Left ventricular diastolic parameters are consistent with Grade I diastolic dysfunction (impaired relaxation). Right Ventricle: The right ventricular size is normal. No increase in right ventricular wall thickness. Right ventricular systolic function is normal. Tricuspid regurgitation signal is inadequate for assessing PA pressure. Left Atrium: Left atrial size was normal in size. Right Atrium: Right atrial size was normal in size. Pericardium: There is no evidence of pericardial effusion. Mitral Valve: The mitral valve is normal in structure. Mild mitral annular calcification. No evidence of mitral valve regurgitation. No evidence of mitral valve stenosis. Tricuspid Valve: The tricuspid valve is normal in structure. Tricuspid valve regurgitation is mild . No evidence of tricuspid stenosis. Aortic Valve: The aortic valve is normal in structure. Aortic valve regurgitation is not visualized. Aortic  valve sclerosis/calcification is present, without any evidence of aortic stenosis. Pulmonic Valve: The pulmonic valve was normal in structure. Pulmonic valve regurgitation is not visualized. No evidence of pulmonic stenosis. Aorta: The aortic root is normal in size and structure. Venous: The inferior vena cava is normal in size with greater than 50% respiratory variability, suggesting right atrial pressure of 3 mmHg. IAS/Shunts: No atrial level shunt detected by color flow Doppler. Additional Comments: 3D was performed not requiring image post processing on an independent workstation and was indeterminate.  LEFT VENTRICLE PLAX 2D LVIDd:         3.90 cm   Diastology LVIDs:         2.20 cm   LV e' medial:    5.71 cm/s LV PW:         1.00 cm   LV E/e' medial:  16.1 LV IVS:        1.00 cm   LV e' lateral:   5.33 cm/s LVOT diam:     2.00 cm   LV E/e' lateral: 17.2 LV SV:         88 LV SV Index:   46 LVOT Area:     3.14 cm  RIGHT VENTRICLE RV Basal diam:  3.20 cm RV S prime:     15.30 cm/s TAPSE (M-mode): 2.8 cm LEFT ATRIUM             Index        RIGHT ATRIUM          Index LA diam:        3.50 cm 1.84 cm/m   RA Area:     8.71 cm LA Vol (A2C):   30.3 ml 15.96  ml/m  RA Volume:   19.70 ml 10.38 ml/m LA Vol (A4C):   28.2 ml 14.85 ml/m LA Biplane Vol: 30.6 ml 16.12 ml/m  AORTIC VALVE LVOT Vmax:   120.50 cm/s LVOT Vmean:  77.750 cm/s LVOT VTI:    0.280 m  AORTA Ao Root diam: 3.30 cm Ao Asc diam:  3.40 cm MITRAL VALVE MV Area (PHT): 2.48 cm     SHUNTS MV Decel Time: 306 msec     Systemic VTI:  0.28 m MV E velocity: 91.70 cm/s   Systemic Diam: 2.00 cm MV A velocity: 129.50 cm/s MV E/A ratio:  0.71 Evalene Lunger MD Electronically signed by Evalene Lunger MD Signature Date/Time: 06/13/2024/2:14:35 PM    Final    MR BRAIN WO CONTRAST Result Date: 06/12/2024 EXAM: MRI BRAIN WITHOUT CONTRAST 06/12/2024 04:28:07 PM TECHNIQUE: Multiplanar multisequence MRI of the head/brain was performed without the administration of  intravenous contrast. Best obtainable images due to patient becoming claustrophobic and refusing to continue exam. Axial and coronal DWI and ADC images were obtained. These images are noted by motion artifact. COMPARISON: Same day CTA head and neck. CLINICAL HISTORY: Transient ischemic attack (TIA). FINDINGS: BRAIN AND VENTRICLES: There are areas of acute infarct within the right frontal lobe involving the corona radiata and periventricular white matter. There is periventricular volume loss and findings suggestive of chronic microvascular ischemic changes. No midline shift. No hydrocephalus. IMPRESSION: 1. Patient unable to complete exam due to claustrophobia. Limited DWI and ADC images obtained. Motion artifact. 2. Within these limitations, acute infarcts noted within the right frontal lobe involving the corona radiata and periventricular white matter. Electronically signed by: Donnice Mania MD 06/12/2024 05:20 PM EDT RP Workstation: [id number]   CT ANGIO HEAD NECK W WO CM Addendum Date: 06/12/2024  ADDENDUM #1  EXAM: CT HEAD WITHOUT CTA HEAD AND NECK WITH AND WITHOUT 06/12/2024 04:10:00 PM TECHNIQUE: CTA of the head and neck was performed with and without the administration of intravenous contrast. Noncontrast CT of the head with reconstructed 2-D images are also provided for review. Multiplanar 2D and/or 3D reformatted images are provided for review. Automated exposure control, iterative reconstruction, and/or weight based adjustment of the mA/kV was utilized to reduce the radiation dose to as low as reasonably achievable. COMPARISON: CT head earlier same day as well as carotid ultrasound dated 07/01/2024. CLINICAL HISTORY: Stroke/TIA, assess for intracardiac shunt. Pt presents with R side weakness, aphasia that started 8/4. She did have a fall at that time and has been having L hip pain. Pt saw her PCP yesterday and was started on ASA. FINDINGS: CT HEAD: BRAIN AND VENTRICLES: No acute intracranial hemorrhage.  No mass effect. No midline shift. No extra-axial fluid collection. Gray-white differentiation is maintained. No hydrocephalus. ORBITS: No acute abnormality. SINUSES AND MASTOIDS: No acute abnormality. CTA NECK: AORTIC ARCH AND ARCH VESSELS: Mixed atherosclerotic plaque within the visualized aortic arch. Atherosclerosis involves the origin of the left subclavian artery without high-grade stenosis. CERVICAL CAROTID ARTERIES: The right carotid artery is patent from the origin to the skull base. Mixed attenuation atherosclerotic plaque at the carotid bifurcation with prominent noncalcified component extending to the carotid bulb. There is approximately 75% stenosis at the portion of the right cervical ICA within the proximal right cervical ICA. There is intraluminal soft tissue concerning for focal intraluminal prominence visualized on series 6 image 229 and on series 8 image 57. Intraluminal thrombus is associated with approximately 50% luminal narrowing. Additional mixed atherosclerotic plaque in the distal right cervical ICA  without hemodynamically significant stenosis. There is bulky calcified atherosclerosis at the left carotid bifurcation resulting in approximately 60% stenosis of the origin of the left cervical ICA. CERVICAL VERTEBRAL ARTERIES: Patent vertebral arteries in the neck. LUNGS AND MEDIASTINUM: Biapical pleural parenchymal scarring. SOFT TISSUES: No acute abnormality. BONES: Degenerative changes in the visualized spine. There is fusion of the C5 and C6 vertebral bodies. CTA HEAD: ANTERIOR CIRCULATION: The intracranial internal carotid arteries are patent bilaterally. The MCAs are patent bilaterally. The ACAs are patent bilaterally. 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 POSTERIOR CIRCULATION: Diminutive appearance of the basilar artery with significant distal tapering of the basilar artery after the origin of the superior cerebellar arteries. The PCAs are patent  bilaterally and are primarily supplied by the anterior circulation. OTHER: No dural venous sinus thrombosis on this non-dedicated study. IMPRESSION: 1. Mixed atherosclerotic plaque at the right carotid bifurcation resulting in approximately 75% stenosis of the right cervical ICA origin. Focal intraluminal soft tissue in the proximal right cervical ICA concerning for intraluminal thrombus with approximately 50% luminal narrowing. 2. Approximately 60% stenosis of the origin of the left cervical ICA due to calcified atherosclerosis. 3. Additional atherosclerosis as above. 4. Findings suggestive of vertebrobasilar hypoplasia. 5. Impression point #1 discussed with Dr. Laurita at 4:42PM on 06/12/24. Electronically signed by: Donnice Mania MD 06/12/2024 04:55 PM EDT RP Workstation: [id number]   Result Date: 06/12/2024 ** ORIGINAL REPORT  EXAM: CT HEAD WITHOUT CTA HEAD AND NECK WITH AND WITHOUT 06/12/2024 04:10:00 PM TECHNIQUE: CTA of the head and neck was performed with and without the administration of intravenous contrast. Noncontrast CT of the head with reconstructed 2-D images are also provided for review. Multiplanar 2D and/or 3D reformatted images are provided for review. Automated exposure control, iterative reconstruction, and/or weight based adjustment of the mA/kV was utilized to reduce the radiation dose to as low as reasonably achievable. COMPARISON: CT head earlier same day as well as carotid ultrasound dated 07/01/2024. CLINICAL HISTORY: Stroke/TIA, assess for intracardiac shunt. Pt presents with R side weakness, aphasia that started 8/4. She did have a fall at that time and has been having L hip pain. Pt saw her PCP yesterday and was started on ASA. FINDINGS: CT HEAD: BRAIN AND VENTRICLES: No acute intracranial hemorrhage. No mass effect. No midline shift. No extra-axial fluid collection. Gray-white differentiation is maintained. No hydrocephalus. ORBITS: No acute abnormality. SINUSES AND MASTOIDS: No acute  abnormality. CTA NECK: AORTIC ARCH AND ARCH VESSELS: Mixed atherosclerotic plaque within the visualized aortic arch. Atherosclerosis involves the origin of the left subclavian artery without high-grade stenosis. CERVICAL CAROTID ARTERIES: The right carotid artery is patent from the origin to the skull base. Mixed attenuation atherosclerotic plaque at the carotid bifurcation with prominent noncalcified component extending to the carotid bulb. There is approximately 75% stenosis at the portion of the right cervical ICA within the proximal right cervical ICA. There is intraluminal soft tissue concerning for focal intraluminal prominence visualized on series 6 image 229 and on series 8 image 57. Intraluminal thrombus is associated with approximately 50% luminal narrowing. Additional mixed atherosclerotic plaque in the distal right cervical ICA without hemodynamically significant stenosis. There is bulky calcified atherosclerosis at the left carotid bifurcation resulting in approximately 60% stenosis of the origin of the left cervical ICA. CERVICAL VERTEBRAL ARTERIES: Patent vertebral arteries in the neck. LUNGS AND MEDIASTINUM: Biapical pleural parenchymal scarring. SOFT TISSUES: No acute abnormality. BONES: Degenerative changes in the visualized spine. There is fusion of the C5 and  C6 vertebral bodies. CTA HEAD: ANTERIOR CIRCULATION: The intracranial internal carotid arteries are patent bilaterally. The MCAs are patent bilaterally. The ACAs are patent bilaterally. 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 POSTERIOR CIRCULATION: Diminutive appearance of the basilar artery with significant distal tapering of the basilar artery after the origin of the superior cerebellar arteries. The PCAs are patent bilaterally and are primarily supplied by the anterior circulation. OTHER: No dural venous sinus thrombosis on this non-dedicated study. IMPRESSION: 1. Mixed atherosclerotic plaque at the right  carotid bifurcation resulting in approximately 75% stenosis of the right cervical ICA origin. Focal intraluminal soft tissue in the proximal right cervical ICA concerning for intraluminal thrombus with approximately 50% luminal narrowing. 2. Approximately 60% stenosis of the origin of the left cervical ICA due to calcified atherosclerosis. 3. Additional atherosclerosis as above. 4. Findings suggestive of vertebrobasilar hypoplasia. Electronically signed by: Donnice Mania MD 06/12/2024 04:40 PM EDT RP Workstation: [id number]   DG Hip Unilat With Pelvis 2-3 Views Left Result Date: 06/12/2024 EXAM: 2 or 3 VIEW(S) XRAY OF THE PELVIS AND LEFT HIP 06/12/2024 12:45:46 PM COMPARISON: 04/25/2021 CLINICAL HISTORY: Fall. FINDINGS: JOINTS: Postoperative changes from previous left total hip arthroplasty. No signs of acute fracture or dislocation. Chronic left superior and inferior pubic rami fractures identified. Degenerative changes noted within the right hip. 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 SOFT TISSUES: Mild osteopenia. IMPRESSION: 1. No acute fracture or dislocation of the left hip. 2. Chronic left superior and inferior pubic rami fractures. 3. Degenerative changes in the right hip. 4. 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 Electronically signed by: Waddell Calk MD 06/12/2024 02:04 PM EDT RP Workstation: [id number]   CT HEAD WO CONTRAST Result Date: 06/12/2024 EXAM: CT HEAD WITHOUT CONTRAST 06/12/2024 12:24:12 PM TECHNIQUE: CT of the head was performed without the administration of intravenous contrast. Automated exposure control, iterative reconstruction, and/or weight based adjustment of the mA/kV was utilized to reduce the radiation dose to as low as reasonably achievable. COMPARISON: 07/13/2021 CLINICAL HISTORY: Neuro deficit, acute, stroke suspected. Pt presents with R side weakness, aphasia that started 8/4. She did have a fall at that  time and has been having L hip pain. Pt saw her PCP yesterday and was started on ASA. Pt continued to have progressive worsening of weakness and was having difficulty feeding herself last night. Pt is A/Ox4 during triage with slow slurred speech noted. FINDINGS: BRAIN AND VENTRICLES: No acute hemorrhage. Gray-white differentiation is preserved. No hydrocephalus. No extra-axial collection. No mass effect or midline shift. Nonspecific hypoattenuation in the periventricular and subcortical white matter, most likely representing chronic small vessel disease. Generalized parenchymal volume loss. ORBITS: No acute abnormality. SINUSES: No acute abnormality. SOFT TISSUES AND SKULL: No acute soft tissue abnormality. No skull fracture. VASCULATURE: Atherosclerosis in the carotid siphons. IMPRESSION: 1. No acute intracranial abnormality. 2. Nonspecific hypoattenuation in the periventricular and subcortical white matter, most likely representing chronic small vessel disease. Electronically signed by: Donnice Mania MD 06/12/2024 01:05 PM EDT RP Workstation: [id number]      Time coordinating discharge: over 30 minutes  SIGNED:  Aidan Caloca DO [facility]

## 2024-06-18 NOTE — Progress Notes (Signed)
 See TOC notes - D/C held, family is declining [facility] would like different SNF rehab facility, appeal for discharge is in process. I spent extensive time in discussion with family this morning and this afternoon 35 min

## 2024-06-19 DIAGNOSIS — G459 Transient cerebral ischemic attack, unspecified: Secondary | ICD-10-CM | POA: Diagnosis not present

## 2024-06-19 NOTE — Plan of Care (Signed)
  Problem: Self-Care: Goal: Ability to participate in self-care as condition permits will improve Outcome: Progressing   

## 2024-06-19 NOTE — Progress Notes (Signed)
 Physical Therapy Treatment Patient Details Name: Lori Wallace MRN: [id number] DOB: 1936/03/09 Today's Date: 06/19/2024   History of Present Illness Pt is a 88 y.o. female presented with strokelike symptoms. 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 PMH of HTN, skin cancer, breast cancer s/p bilateral lumpectomy.    PT Comments  Pt demonstrates overall improvement with functional mobility and now is requiring one person to assist with activities such as supine<>sit and transferring to the [facility].  Pt requires min verbal cuing for sequencing and can follow one-step commands consistently. Pt still demonstrates L lateral lean while sitting however is able to maintain seated balance with support from external surfaces. Would benefit from skilled PT to address above deficits and promote optimal return to PLOF.    If plan is discharge home, recommend the following: A lot of help with walking and/or transfers;Two people to help with bathing/dressing/bathroom;Assistance with cooking/housework;Assist for transportation;Help with stairs or ramp for entrance   Can travel by private vehicle     No  Equipment Recommendations  Rolling walker (2 wheels)    Recommendations for Other Services       Precautions / Restrictions Precautions Precautions: Fall Recall of Precautions/Restrictions: Impaired Precaution/Restrictions Comments: Pt has hx of cognitive impairments. She is pleasant and agreeable to PT session. Restrictions Weight Bearing Restrictions Per Provider Order: No     Mobility  Bed Mobility Overal bed mobility: Needs Assistance Bed Mobility: Supine to Sit     Supine to sit: Mod assist, HOB elevated Sit to supine: Mod assist   General bed mobility comments: Pt able to intiate bed mobility with HHA to sit up, patient was able to manage/place her LEs appropriately for supine>sit. Mod verbal/tactile  cuing for sequencing for proper positioning in the bed.    Transfers Overall transfer level: Needs assistance Equipment used: 1 person hand held assist Transfers: Bed to chair/wheelchair/BSC Sit to Stand: Mod assist, From elevated surface   Step pivot transfers: Mod assist       General transfer comment: Pt performed transfer from bed<>BSC with mod HHA from 1 person. She is able to take steps to pivot towards the [facility]. Once at the [facility] pt uses BUE from counter while PT manages clothing. Poor eccentric control to sit    Ambulation/Gait               General Gait Details: NT. Pt in bed pre/post session.   Stairs             Wheelchair Mobility     Tilt Bed    Modified Rankin (Stroke Patients Only)       Balance Overall balance assessment: Needs assistance Sitting-balance support: Feet supported, Bilateral upper extremity supported Sitting balance-Leahy Scale: Poor Sitting balance - Comments: Patient able to maintain balance at EOB, but requires BUE and feet supported to maintain balance. Postural control: Left lateral lean Standing balance support: Single extremity supported, During functional activity Standing balance-Leahy Scale: Poor Standing balance comment: Mod x 2 with HHA. Verbal cuing for upright posture.                            Communication Communication Communication: No apparent difficulties  Cognition Arousal: Alert Behavior During Therapy: WFL for tasks assessed/performed   PT - Cognitive impairments: Sequencing  PT - Cognition Comments: Pt requires cuing for sequencing. She is able to follow one step commands consistently. Following commands: Intact Following commands impaired: Only follows one step commands consistently    Cueing Cueing Techniques: Verbal cues, Visual cues  Exercises Other Exercises Other Exercises: Mod assistance for transfer to [facility] and clothing management for  toileting. Other Exercises: edu to family about how to give physical assistance for STS.    General Comments        Pertinent Vitals/Pain Pain Assessment Pain Assessment: No/denies pain    Home Living                          Prior Function            PT Goals (current goals can now be found in the care plan section) Acute Rehab PT Goals Patient Stated Goal: I'd like to go to the bathroom PT Goal Formulation: With patient/family Time For Goal Achievement: 06/26/24 Potential to Achieve Goals: Fair Progress towards PT goals: Progressing toward goals    Frequency    Min 2X/week      PT Plan      Co-evaluation              AM-PAC PT 6 Clicks Mobility   Outcome Measure  Help needed turning from your back to your side while in a flat bed without using bedrails?: A Lot Help needed moving from lying on your back to sitting on the side of a flat bed without using bedrails?: A Lot Help needed moving to and from a bed to a chair (including a wheelchair)?: A Lot Help needed standing up from a chair using your arms (e.g., wheelchair or bedside chair)?: A Little Help needed to walk in hospital room?: A Lot Help needed climbing 3-5 steps with a railing? : Total 6 Click Score: 12    End of Session   Activity Tolerance: Patient tolerated treatment well;Patient limited by fatigue Patient left: in bed;with call bell/phone within reach;with bed alarm set;with family/visitor present Nurse Communication: Mobility status PT Visit Diagnosis: Muscle weakness (generalized) (M62.81);Difficulty in walking, not elsewhere classified (R26.2);Other symptoms and signs involving the nervous system (R29.898);Unsteadiness on feet (R26.81)     Time: 1541-1600 PT Time Calculation (min) (ACUTE ONLY): 19 min  Charges:    $Therapeutic Activity: 8-22 mins PT General Charges $$ ACUTE PT VISIT: 1 Visit                     Luvenia Avers, SPT    [facility] [place] 06/19/2024,  4:31 PM

## 2024-06-19 NOTE — Plan of Care (Signed)
 Patient is Comfort Care   Problem: Education: Goal: Knowledge of disease or condition will improve Outcome: Not Applicable Goal: Knowledge of secondary prevention will improve (MUST DOCUMENT ALL) Outcome: Not Applicable Goal: Knowledge of patient specific risk factors will improve (DELETE if not current risk factor) Outcome: Not Applicable   Problem: Ischemic Stroke/TIA Tissue Perfusion: Goal: Complications of ischemic stroke/TIA will be minimized Outcome: Not Applicable   Problem: Coping: Goal: Will verbalize positive feelings about self Outcome: Not Applicable Goal: Will identify appropriate support needs Outcome: Not Applicable   Problem: Health Behavior/Discharge Planning: Goal: Ability to manage health-related needs will improve Outcome: Not Applicable Goal: Goals will be collaboratively established with patient/family Outcome: Not Applicable   Problem: Self-Care: Goal: Verbalization of feelings and concerns over difficulty with self-care will improve Outcome: Not Applicable Goal: Ability to communicate needs accurately will improve Outcome: Not Applicable   Problem: Nutrition: Goal: Risk of aspiration will decrease Outcome: Not Applicable Goal: Dietary intake will improve Outcome: Not Applicable   Problem: Education: Goal: Knowledge of General Education information will improve Description: Including pain rating scale, medication(s)/side effects and non-pharmacologic comfort measures Outcome: Not Applicable   Problem: Health Behavior/Discharge Planning: Goal: Ability to manage health-related needs will improve Outcome: Not Applicable   Problem: Clinical Measurements: Goal: Ability to maintain clinical measurements within normal limits will improve Outcome: Not Applicable Goal: Will remain free from infection Outcome: Not Applicable Goal: Diagnostic test results will improve Outcome: Not Applicable Goal: Respiratory complications will improve Outcome: Not  Applicable Goal: Cardiovascular complication will be avoided Outcome: Not Applicable   Problem: Activity: Goal: Risk for activity intolerance will decrease Outcome: Not Applicable   Problem: Nutrition: Goal: Adequate nutrition will be maintained Outcome: Not Applicable   Problem: Coping: Goal: Level of anxiety will decrease Outcome: Not Applicable   Problem: Elimination: Goal: Will not experience complications related to bowel motility Outcome: Not Applicable Goal: Will not experience complications related to urinary retention Outcome: Not Applicable   Problem: Pain Managment: Goal: General experience of comfort will improve and/or be controlled Outcome: Not Applicable   Problem: Safety: Goal: Ability to remain free from injury will improve Outcome: Not Applicable   Problem: Skin Integrity: Goal: Risk for impaired skin integrity will decrease Outcome: Not Applicable

## 2024-06-19 NOTE — Progress Notes (Signed)
 Physical Therapy Treatment Patient Details Name: Lori Wallace MRN: [id number] DOB: [birth year] Today's Date: 06/19/2024   History of Present Illness Pt is a 88 y.o. female presented with strokelike symptoms. 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 PMH of HTN, skin cancer, breast cancer s/p bilateral lumpectomy.    PT Comments  Pt demonstrates overall improvement with functional mobility and now is requiring one person to assist with activities such as supine<>sit and transferring to the [facility].  Pt requires min verbal cuing for sequencing and can follow one-step commands consistently. Pt still demonstrates L lateral lean while sitting however is able to maintain seated balance with support from external surfaces. Would benefit from skilled PT to address above deficits and promote optimal return to PLOF.    If plan is discharge home, recommend the following: A lot of help with walking and/or transfers;Two people to help with bathing/dressing/bathroom;Assistance with cooking/housework;Assist for transportation;Help with stairs or ramp for entrance   Can travel by private vehicle     No  Equipment Recommendations  Rolling walker (2 wheels)    Recommendations for Other Services       Precautions / Restrictions Precautions Precautions: Fall Recall of Precautions/Restrictions: Impaired Precaution/Restrictions Comments: Pt has hx of cognitive impairments. She is pleasant and agreeable to PT session. Restrictions Weight Bearing Restrictions Per Provider Order: No     Mobility  Bed Mobility Overal bed mobility: Needs Assistance Bed Mobility: Supine to Sit     Supine to sit: Mod assist, HOB elevated Sit to supine: Mod assist   General bed mobility comments: Pt able to intiate bed mobility with HHA to sit up, patient was able to manage/place her LEs appropriately for supine>sit. Mod verbal/tactile  cuing for sequencing for proper positioning in the bed.    Transfers Overall transfer level: Needs assistance Equipment used: 1 person hand held assist Transfers: Bed to chair/wheelchair/BSC Sit to Stand: Mod assist, From elevated surface   Step pivot transfers: Mod assist       General transfer comment: Pt performed transfer from bed<>BSC with mod HHA from 1 person. She is able to take steps to pivot towards the [facility]. Once at the [facility] pt uses BUE from counter while PT manages clothing. Poor eccentric control to sit    Ambulation/Gait               General Gait Details: NT. Pt in bed pre/post session.   Stairs             Wheelchair Mobility     Tilt Bed    Modified Rankin (Stroke Patients Only)       Balance Overall balance assessment: Needs assistance Sitting-balance support: Feet supported, Bilateral upper extremity supported Sitting balance-Leahy Scale: Poor Sitting balance - Comments: Patient able to maintain balance at EOB, but requires BUE and feet supported to maintain balance. Postural control: Left lateral lean Standing balance support: Single extremity supported, During functional activity Standing balance-Leahy Scale: Poor Standing balance comment: Mod x 2 with HHA. Verbal cuing for upright posture.                            Communication Communication Communication: No apparent difficulties  Cognition Arousal: Alert Behavior During Therapy: WFL for tasks assessed/performed   PT - Cognitive impairments: Sequencing  PT - Cognition Comments: Pt requires cuing for sequencing. She is able to follow one step commands consistently. Following commands: Intact Following commands impaired: Only follows one step commands consistently    Cueing Cueing Techniques: Verbal cues, Visual cues  Exercises Other Exercises Other Exercises: Mod assistance for transfer to [facility] and clothing management for  toileting. Other Exercises: edu to family about how to give physical assistance for STS.    General Comments        Pertinent Vitals/Pain Pain Assessment Pain Assessment: No/denies pain    Home Living                          Prior Function            PT Goals (current goals can now be found in the care plan section) Acute Rehab PT Goals Patient Stated Goal: I'd like to go to the bathroom PT Goal Formulation: With patient/family Time For Goal Achievement: 06/26/24 Potential to Achieve Goals: Fair Progress towards PT goals: Progressing toward goals    Frequency    Min 2X/week      PT Plan      Co-evaluation              AM-PAC PT 6 Clicks Mobility   Outcome Measure  Help needed turning from your back to your side while in a flat bed without using bedrails?: A Lot Help needed moving from lying on your back to sitting on the side of a flat bed without using bedrails?: A Lot Help needed moving to and from a bed to a chair (including a wheelchair)?: A Lot Help needed standing up from a chair using your arms (e.g., wheelchair or bedside chair)?: A Little Help needed to walk in hospital room?: A Lot Help needed climbing 3-5 steps with a railing? : Total 6 Click Score: 12    End of Session   Activity Tolerance: Patient tolerated treatment well;Patient limited by fatigue Patient left: in bed;with call bell/phone within reach;with bed alarm set;with family/visitor present Nurse Communication: Mobility status PT Visit Diagnosis: Muscle weakness (generalized) (M62.81);Difficulty in walking, not elsewhere classified (R26.2);Other symptoms and signs involving the nervous system (R29.898);Unsteadiness on feet (R26.81)     Time: 1541-1600 PT Time Calculation (min) (ACUTE ONLY): 19 min  Charges:    $Therapeutic Activity: 8-22 mins PT General Charges $$ ACUTE PT VISIT: 1 Visit                     Lori Wallace, SPT    Katrese Shell 06/19/2024,  4:31 PM

## 2024-06-19 NOTE — Discharge Summary (Signed)
 Physician Discharge Summary   Patient: Lori Wallace MRN: [id number]  DOB: July 24, 1936   Admit:     Date of Admission: 06/12/2024 Admitted from: home   Discharge: Date of discharge: 06/19/24 dc orders placed  Disposition: Skilled nursing facility / rehab  Condition at discharge: good  CODE STATUS: DNR     Discharge Physician: Laneta Blunt, DO [facility]     PCP: [name], MD  Recommendations for Outpatient Follow-up:  Follow up with PCP [name], MD in 2-4 weeks Consider antihypertensives and restarting BB based on continued vitals monitoring. HR WNL off of her BB and BP is elevated to 160s.  Follow as directed with neurology  Follow with vascular surgery if desired endarterectomy   Discharge Diagnoses: Principal Problem:   TIA (transient ischemic attack) Active Problems:   CVA (cerebral vascular accident) [facility])  Hospital course / significant events:   HPI: Lori Wallace is a 88 y.o. female with medical history significant of HTN, skin cancer, breast cancer status post bilateral lumpectomy, presented with strokelike symptoms, including recent L sided weakness, fall w/o LOC, slurred speech. Went to see PCP, suspected TIA, startes on ASA. Pt continues to have speech difficulty and swallowing difficulty, memory problems. Family brought pt to ED 08/06 08/06: hypertensive, bradycardic. Unremarkable CBC, CMP, UA. Admitted to hospitalist for TIA/CVA w/u. Imaging: CTA H/N (+)75% narrowing on the right carotid bifurcation site with signs of distal intramural thrombosis formation. Admitting hospitalist D/W oncall neurology Dr. Lindzen - he recommended carotid doppler to confirm CTA finding and permissive HTN, will see patient in AM to decide next step. MRI brain unable to complete d/t claustrophobia and motion artifact but despite these limitations MRI did reveal acute infarcts noted within the right frontal lobe involving the corona radiata and  periventricular white matter 08/07: Carotid US  today, confirmed stenosis, no mention of thrombus. Vascular surgery consult. Echo EF 60-65, Grade 1 diast df.  08/08: vascular surgery consult - family is leaning toward nonsurgical management but is discussing further before final decision. Await SNF placement for rehab  08/09: family brings MOST form - pt has indicated previously for DNR so orders updated. Pt also indicated limited interventions, was not specific about surgeries but family is leaning toward no endarterectomy / discuss outpatient and treat medically for now.  8/10 through present- patient is medically stable and family elected to not pursue endarterectomy. She is being managed on medications as listed below and will follow up with neurology outpatient. PT/OT evaluated and recommended SNF at dc.  While awaiting her dc to SNF, she had some fluctuation of mentation which is likely expected in acute post- stroke period in underlying suspected dementia at baseline.  In addition to the acute findings on brain imaging, also had chronic changes that would support this suspicion and further evident by caregiver description of her baseline cognition. MRI brain reads: There is periventricular volume loss and findings suggestive of chronic microvascular ischemic changes.Could consider formal workup on outpatient setting once recovered.   Consultants:  Neurology  Vascular surgery   Procedures/Surgeries: none  ASSESSMENT & PLAN:   Acute CVA  Acute left paresis, resolved Acute aphasia, resolved Acute dysphagia Continue plavix , ASA, statin. Did not pursue endarterectomy per family preference which can be readdressed at later date if decides to pursue with vascular surgery.  Continue PT/OT, speech at rehab.   HTN Held atenolol  for permissive HTN, also bradycardic. BP remained elevated intermittently to 160s systolics. Recommend reevaluating and consider starting  agent as she was not on  antihypertensives prior to admission.    Bradycardia- Likely beta blocker effect Holding atenolol  resolved. HR in 70-80s without BB.    Chronic normocytic anemia H&H stable, no symptoms or signs of active bleeding Monitor CBC  Discharge Instructions  Allergies as of 06/19/2024       Reactions   Demerol [meperidine] Nausea And Vomiting   Propoxyphene Nausea And Vomiting   Tape Itching        Medication List     STOP taking these medications    atenolol  25 MG tablet Commonly known as: TENORMIN    ibuprofen  200 MG tablet Commonly known as: ADVIL    PROBIOTIC DAILY PO       TAKE these medications    acetaminophen  325 MG tablet Commonly known as: TYLENOL  Take 2 tablets (650 mg total) by mouth every 4 (four) hours as needed for mild pain (pain score 1-3) or fever (or temp > 37.5 C (99.5 F)).   aspirin  EC 81 MG tablet Take 1 tablet (81 mg total) by mouth daily. Swallow whole.   bisacodyl  10 MG suppository Commonly known as: DULCOLAX Place 1 suppository (10 mg total) rectally daily as needed for moderate constipation.   buPROPion 150 MG 24 hr tablet Commonly known as: WELLBUTRIN XL Take 150 mg by mouth daily.   CALCIUM  MAGNESIUM  PO Take 1 tablet by mouth daily.   cholecalciferol  1000 units tablet Commonly known as: VITAMIN D  Take 1,000 Units by mouth daily.   clopidogrel  75 MG tablet Commonly known as: PLAVIX  Take 1 tablet (75 mg total) by mouth daily.   feeding supplement Liqd Take 237 mLs by mouth 2 (two) times daily between meals.   loratadine  10 MG tablet Commonly known as: CLARITIN  Take 10 mg by mouth daily as needed for allergies.   mirtazapine 15 MG tablet Commonly known as: REMERON Take 15 mg by mouth at bedtime.   REFRESH OP Place 1 drop into both eyes as needed (dry eyes).   rosuvastatin  40 MG tablet Commonly known as: CRESTOR  Take 1 tablet (40 mg total) by mouth daily.   sodium chloride  0.65 % Soln nasal spray Commonly known as:  OCEAN Place 1 spray into both nostrils as needed for congestion.   Zinc  50 MG Tabs Take 50 mg by mouth daily.         Follow-up Information     [name], MD Follow up.   Specialty: Family Medicine Why: hospital follow up Contact information: [address] [facility] [place] [address] [phone]                 Allergies  Allergen Reactions   Demerol [Meperidine] Nausea And Vomiting   Propoxyphene Nausea And Vomiting   Tape Itching    Subjective: pt reports feeling well today. Denies medical complaints. She is concerned about going to rehab. Questions and concerns of patient and caregiver were addressed at bedside.   Discharge Exam: BP (!) 171/86 (BP Location: Left Arm)   Pulse 73   Temp 97.8 F (36.6 C)   Resp 18   Ht 5' 10 (1.778 m)   Wt 74 kg   SpO2 100%   BMI 23.41 kg/m  General: Pt is alert, awake, not in acute distress Cardiovascular: RRR, S1/S2 +, no rubs, no gallops Respiratory: CTA bilaterally, no wheezing, no rhonchi Abdominal: Soft, NT, ND, bowel sounds + Extremities: no edema, no cyanosis Neuro: pt oriented to year, person,  not to place/situation. Remote memory is intact.   The results of significant diagnostics from this hospitalization (including imaging, microbiology, ancillary and laboratory) are listed below for reference.     Microbiology: No results found for this or any previous visit (from the past 240 hours).   Labs: BNP (last 3 results) No results for input(s): BNP in the last 8760 hours. Basic Metabolic Panel: Recent Labs  Lab 06/12/24 1136 06/14/24 0358 06/18/24 1107  NA 139 140 139  K 4.0 4.1 3.9  CL 104 105 107  CO2 27 26 24   GLUCOSE 94 134* 130*  BUN 25* 37* 33*  CREATININE 1.06* 1.22* 1.04*  CALCIUM  9.2 9.4 8.9   Liver Function Tests: Recent Labs  Lab 06/12/24 1136  AST 20  ALT 15  ALKPHOS 63  BILITOT 1.0  PROT 7.7  ALBUMIN 3.7    CBC: Recent Labs  Lab 06/12/24 1136 06/14/24 0358 06/18/24 1107  WBC 9.3 11.3* 10.6*  NEUTROABS 5.5  --   --   HGB 11.9* 13.3 12.1  HCT 37.0 41.6 37.6  MCV 96.6 96.5 95.9  PLT 265 303 290   Cardiac Enzymes: Recent Labs  Lab 06/13/24 1748  CKTOTAL 115   BNP: Invalid input(s): POCBNP CBG: Recent Labs  Lab 06/12/24 1138  GLUCAP 102*   Urinalysis    Component Value Date/Time   COLORURINE STRAW (A) 06/12/2024 1206   APPEARANCEUR CLEAR (A) 06/12/2024 1206   APPEARANCEUR Clear 09/24/2014 1532   LABSPEC 1.004 (L) 06/12/2024 1206   LABSPEC 1.004 09/24/2014 1532   PHURINE 6.0 06/12/2024 1206   GLUCOSEU NEGATIVE 06/12/2024 1206   GLUCOSEU Negative 09/24/2014 1532   HGBUR NEGATIVE 06/12/2024 1206   BILIRUBINUR NEGATIVE 06/12/2024 1206   BILIRUBINUR Negative 09/24/2014 1532   KETONESUR NEGATIVE 06/12/2024 1206   PROTEINUR NEGATIVE 06/12/2024 1206   UROBILINOGEN 0.2 10/21/2014 1259   NITRITE NEGATIVE 06/12/2024 1206   LEUKOCYTESUR NEGATIVE 06/12/2024 1206   LEUKOCYTESUR Negative 09/24/2014 1532   Sepsis Labs Recent Labs  Lab 06/12/24 1136 06/14/24 0358 06/18/24 1107  WBC 9.3 11.3* 10.6*   Microbiology No results found for this or any previous visit (from the past 240 hours). Imaging US  Carotid Bilateral Result Date: 06/13/2024 CLINICAL DATA:  Thrombosis. EXAM: BILATERAL CAROTID DUPLEX ULTRASOUND TECHNIQUE: Elnor scale imaging, color Doppler and duplex ultrasound were performed of bilateral carotid and vertebral arteries in the neck. COMPARISON:  Neck CTA 06/12/2024 FINDINGS: Criteria: Quantification of carotid stenosis is based on velocity parameters that correlate the residual internal carotid diameter with NASCET-based stenosis levels, using the diameter of the distal internal carotid lumen as the denominator for stenosis measurement. The following velocity measurements were obtained: RIGHT ICA: 241 cm/sec CCA: 77 cm/sec SYSTOLIC ICA/CCA RATIO:  3.1 ECA: 259 cm/sec  LEFT ICA: 160 cm/sec Bulb: 213 cm/sec CCA: 77 cm/sec SYSTOLIC ICA/CCA RATIO:  2.1 ECA: 287 cm/sec RIGHT CAROTID ARTERY: Irregular echogenic plaque with shadowing at the right carotid bulb. Elevated peak systolic velocity in the proximal external carotid artery compatible with stenosis near the origin. This finding corresponds with the recent CTA findings. Elevated peak systolic velocity in the proximal right internal carotid artery measuring 241 cm/sec. Mid and distal right internal carotid artery are patent. RIGHT VERTEBRAL ARTERY: Antegrade flow and normal waveform in the right vertebral artery. LEFT CAROTID ARTERY: Echogenic plaque at the left carotid bulb. Elevated peak systolic velocity at the left carotid bulb measuring 214 cm/sec. Elevated peak systolic velocity in the left external carotid artery. Echogenic plaque in the  proximal internal carotid artery. Peak systolic velocity in the proximal internal carotid artery is 160 cm/sec. Mid and distal left internal carotid artery are patent. LEFT VERTEBRAL ARTERY: Antegrade flow and normal waveform in the left vertebral artery. IMPRESSION: 1. Atherosclerotic plaque involving bilateral carotid arteries. 2. Estimated degree of stenosis in the right internal carotid artery is greater than 70%. 3. Estimated degree of stenosis at the left carotid bulb and proximal left internal carotid artery is 50-69%. 4. Stenosis involving bilateral external carotid arteries. 5. Patent vertebral arteries bilaterally. Electronically Signed   By: Juliene Balder M.D.   On: 06/13/2024 14:41   ECHOCARDIOGRAM COMPLETE Result Date: 06/13/2024    ECHOCARDIOGRAM REPORT   Patient Name:   ANANDI ABRAMO Date of Exam: 06/12/2024 Medical Rec #:  [id number]      Height:       70.0 in Accession #:    [id number]     Weight:       160.0 lb Date of Birth:  [birth year]       BSA:          1.898 m Patient Age:    87 years       BP:           113/63 mmHg Patient Gender: F              HR:           60 bpm. Exam  Location:  [facility] Procedure: 2D Echo, Cardiac Doppler and Color Doppler (Both Spectral and Color            Flow Doppler were utilized during procedure). Indications:     G45.9 TIA  History:         Patient has no prior history of Echocardiogram examinations.                  Risk Factors:Hypertension.  Sonographer:     Carl Coma RDCS Referring Phys:  [id number] CORT ONEIDA MANA Diagnosing Phys: Evalene Lunger MD IMPRESSIONS  1. Left ventricular ejection fraction, by estimation, is 60 to 65%. The left ventricle has normal function. The left ventricle has no regional wall motion abnormalities. Left ventricular diastolic parameters are consistent with Grade I diastolic dysfunction (impaired relaxation).  2. Right ventricular systolic function is normal. The right ventricular size is normal. Tricuspid regurgitation signal is inadequate for assessing PA pressure.  3. The mitral valve is normal in structure. No evidence of mitral valve regurgitation. No evidence of mitral stenosis.  4. The aortic valve is normal in structure. Aortic valve regurgitation is not visualized. Aortic valve sclerosis/calcification is present, without any evidence of aortic stenosis.  5. The inferior vena cava is normal in size with greater than 50% respiratory variability, suggesting right atrial pressure of 3 mmHg. FINDINGS  Left Ventricle: Left ventricular ejection fraction, by estimation, is 60 to 65%. The left ventricle has normal function. The left ventricle has no regional wall motion abnormalities. Strain was performed and the global longitudinal strain is indeterminate. The left ventricular internal cavity size was normal in size. There is no left ventricular hypertrophy. Left ventricular diastolic parameters are consistent with Grade I diastolic dysfunction (impaired relaxation). Right Ventricle: The right ventricular size is normal. No increase in right ventricular wall thickness. Right ventricular systolic function is normal.  Tricuspid regurgitation signal is inadequate for assessing PA pressure. Left Atrium: Left atrial size was normal in size. Right Atrium: Right atrial size was normal in size. Pericardium: There is no evidence  of pericardial effusion. Mitral Valve: The mitral valve is normal in structure. Mild mitral annular calcification. No evidence of mitral valve regurgitation. No evidence of mitral valve stenosis. Tricuspid Valve: The tricuspid valve is normal in structure. Tricuspid valve regurgitation is mild . No evidence of tricuspid stenosis. Aortic Valve: The aortic valve is normal in structure. Aortic valve regurgitation is not visualized. Aortic valve sclerosis/calcification is present, without any evidence of aortic stenosis. Pulmonic Valve: The pulmonic valve was normal in structure. Pulmonic valve regurgitation is not visualized. No evidence of pulmonic stenosis. Aorta: The aortic root is normal in size and structure. Venous: The inferior vena cava is normal in size with greater than 50% respiratory variability, suggesting right atrial pressure of 3 mmHg. IAS/Shunts: No atrial level shunt detected by color flow Doppler. Additional Comments: 3D was performed not requiring image post processing on an independent workstation and was indeterminate.  LEFT VENTRICLE PLAX 2D LVIDd:         3.90 cm   Diastology LVIDs:         2.20 cm   LV e' medial:    5.71 cm/s LV PW:         1.00 cm   LV E/e' medial:  16.1 LV IVS:        1.00 cm   LV e' lateral:   5.33 cm/s LVOT diam:     2.00 cm   LV E/e' lateral: 17.2 LV SV:         88 LV SV Index:   46 LVOT Area:     3.14 cm  RIGHT VENTRICLE RV Basal diam:  3.20 cm RV S prime:     15.30 cm/s TAPSE (M-mode): 2.8 cm LEFT ATRIUM             Index        RIGHT ATRIUM          Index LA diam:        3.50 cm 1.84 cm/m   RA Area:     8.71 cm LA Vol (A2C):   30.3 ml 15.96 ml/m  RA Volume:   19.70 ml 10.38 ml/m LA Vol (A4C):   28.2 ml 14.85 ml/m LA Biplane Vol: 30.6 ml 16.12 ml/m  AORTIC  VALVE LVOT Vmax:   120.50 cm/s LVOT Vmean:  77.750 cm/s LVOT VTI:    0.280 m  AORTA Ao Root diam: 3.30 cm Ao Asc diam:  3.40 cm MITRAL VALVE MV Area (PHT): 2.48 cm     SHUNTS MV Decel Time: 306 msec     Systemic VTI:  0.28 m MV E velocity: 91.70 cm/s   Systemic Diam: 2.00 cm MV A velocity: 129.50 cm/s MV E/A ratio:  0.71 Evalene Lunger MD Electronically signed by Evalene Lunger MD Signature Date/Time: 06/13/2024/2:14:35 PM    Final    MR BRAIN WO CONTRAST Result Date: 06/12/2024 EXAM: MRI BRAIN WITHOUT CONTRAST 06/12/2024 04:28:07 PM TECHNIQUE: Multiplanar multisequence MRI of the head/brain was performed without the administration of intravenous contrast. Best obtainable images due to patient becoming claustrophobic and refusing to continue exam. Axial and coronal DWI and ADC images were obtained. These images are noted by motion artifact. COMPARISON: Same day CTA head and neck. CLINICAL HISTORY: Transient ischemic attack (TIA). FINDINGS: BRAIN AND VENTRICLES: There are areas of acute infarct within the right frontal lobe involving the corona radiata and periventricular white matter. There is periventricular volume loss and findings suggestive of chronic microvascular ischemic changes. No midline shift. No hydrocephalus. IMPRESSION:  1. Patient unable to complete exam due to claustrophobia. Limited DWI and ADC images obtained. Motion artifact. 2. Within these limitations, acute infarcts noted within the right frontal lobe involving the corona radiata and periventricular white matter. Electronically signed by: Donnice Mania MD 06/12/2024 05:20 PM EDT RP Workstation: [id number]   CT ANGIO HEAD NECK W WO CM Addendum Date: 06/12/2024  ADDENDUM #1  EXAM: CT HEAD WITHOUT CTA HEAD AND NECK WITH AND WITHOUT 06/12/2024 04:10:00 PM TECHNIQUE: CTA of the head and neck was performed with and without the administration of intravenous contrast. Noncontrast CT of the head with reconstructed 2-D images are also provided for  review. Multiplanar 2D and/or 3D reformatted images are provided for review. Automated exposure control, iterative reconstruction, and/or weight based adjustment of the mA/kV was utilized to reduce the radiation dose to as low as reasonably achievable. COMPARISON: CT head earlier same day as well as carotid ultrasound dated 07/01/2024. CLINICAL HISTORY: Stroke/TIA, assess for intracardiac shunt. Pt presents with R side weakness, aphasia that started 8/4. She did have a fall at that time and has been having L hip pain. Pt saw her PCP yesterday and was started on ASA. FINDINGS: CT HEAD: BRAIN AND VENTRICLES: No acute intracranial hemorrhage. No mass effect. No midline shift. No extra-axial fluid collection. Gray-white differentiation is maintained. No hydrocephalus. ORBITS: No acute abnormality. SINUSES AND MASTOIDS: No acute abnormality. CTA NECK: AORTIC ARCH AND ARCH VESSELS: Mixed atherosclerotic plaque within the visualized aortic arch. Atherosclerosis involves the origin of the left subclavian artery without high-grade stenosis. CERVICAL CAROTID ARTERIES: The right carotid artery is patent from the origin to the skull base. Mixed attenuation atherosclerotic plaque at the carotid bifurcation with prominent noncalcified component extending to the carotid bulb. There is approximately 75% stenosis at the portion of the right cervical ICA within the proximal right cervical ICA. There is intraluminal soft tissue concerning for focal intraluminal prominence visualized on series 6 image 229 and on series 8 image 57. Intraluminal thrombus is associated with approximately 50% luminal narrowing. Additional mixed atherosclerotic plaque in the distal right cervical ICA without hemodynamically significant stenosis. There is bulky calcified atherosclerosis at the left carotid bifurcation resulting in approximately 60% stenosis of the origin of the left cervical ICA. CERVICAL VERTEBRAL ARTERIES: Patent vertebral arteries in the  neck. LUNGS AND MEDIASTINUM: Biapical pleural parenchymal scarring. SOFT TISSUES: No acute abnormality. BONES: Degenerative changes in the visualized spine. There is fusion of the C5 and C6 vertebral bodies. CTA HEAD: ANTERIOR CIRCULATION: The intracranial internal carotid arteries are patent bilaterally. The MCAs are patent bilaterally. The ACAs are patent bilaterally. 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 POSTERIOR CIRCULATION: Diminutive appearance of the basilar artery with significant distal tapering of the basilar artery after the origin of the superior cerebellar arteries. The PCAs are patent bilaterally and are primarily supplied by the anterior circulation. OTHER: No dural venous sinus thrombosis on this non-dedicated study. IMPRESSION: 1. Mixed atherosclerotic plaque at the right carotid bifurcation resulting in approximately 75% stenosis of the right cervical ICA origin. Focal intraluminal soft tissue in the proximal right cervical ICA concerning for intraluminal thrombus with approximately 50% luminal narrowing. 2. Approximately 60% stenosis of the origin of the left cervical ICA due to calcified atherosclerosis. 3. Additional atherosclerosis as above. 4. Findings suggestive of vertebrobasilar hypoplasia. 5. Impression point #1 discussed with Dr. Laurita at 4:42PM on 06/12/24. Electronically signed by: Donnice Mania MD 06/12/2024 04:55 PM EDT RP Workstation: [id number]   Result Date: 06/12/2024 **  ORIGINAL REPORT  EXAM: CT HEAD WITHOUT CTA HEAD AND NECK WITH AND WITHOUT 06/12/2024 04:10:00 PM TECHNIQUE: CTA of the head and neck was performed with and without the administration of intravenous contrast. Noncontrast CT of the head with reconstructed 2-D images are also provided for review. Multiplanar 2D and/or 3D reformatted images are provided for review. Automated exposure control, iterative reconstruction, and/or weight based adjustment of the mA/kV was utilized to reduce  the radiation dose to as low as reasonably achievable. COMPARISON: CT head earlier same day as well as carotid ultrasound dated 07/01/2024. CLINICAL HISTORY: Stroke/TIA, assess for intracardiac shunt. Pt presents with R side weakness, aphasia that started 8/4. She did have a fall at that time and has been having L hip pain. Pt saw her PCP yesterday and was started on ASA. FINDINGS: CT HEAD: BRAIN AND VENTRICLES: No acute intracranial hemorrhage. No mass effect. No midline shift. No extra-axial fluid collection. Gray-white differentiation is maintained. No hydrocephalus. ORBITS: No acute abnormality. SINUSES AND MASTOIDS: No acute abnormality. CTA NECK: AORTIC ARCH AND ARCH VESSELS: Mixed atherosclerotic plaque within the visualized aortic arch. Atherosclerosis involves the origin of the left subclavian artery without high-grade stenosis. CERVICAL CAROTID ARTERIES: The right carotid artery is patent from the origin to the skull base. Mixed attenuation atherosclerotic plaque at the carotid bifurcation with prominent noncalcified component extending to the carotid bulb. There is approximately 75% stenosis at the portion of the right cervical ICA within the proximal right cervical ICA. There is intraluminal soft tissue concerning for focal intraluminal prominence visualized on series 6 image 229 and on series 8 image 57. Intraluminal thrombus is associated with approximately 50% luminal narrowing. Additional mixed atherosclerotic plaque in the distal right cervical ICA without hemodynamically significant stenosis. There is bulky calcified atherosclerosis at the left carotid bifurcation resulting in approximately 60% stenosis of the origin of the left cervical ICA. CERVICAL VERTEBRAL ARTERIES: Patent vertebral arteries in the neck. LUNGS AND MEDIASTINUM: Biapical pleural parenchymal scarring. SOFT TISSUES: No acute abnormality. BONES: Degenerative changes in the visualized spine. There is fusion of the C5 and C6  vertebral bodies. CTA HEAD: ANTERIOR CIRCULATION: The intracranial internal carotid arteries are patent bilaterally. The MCAs are patent bilaterally. The ACAs are patent bilaterally. 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 POSTERIOR CIRCULATION: Diminutive appearance of the basilar artery with significant distal tapering of the basilar artery after the origin of the superior cerebellar arteries. The PCAs are patent bilaterally and are primarily supplied by the anterior circulation. OTHER: No dural venous sinus thrombosis on this non-dedicated study. IMPRESSION: 1. Mixed atherosclerotic plaque at the right carotid bifurcation resulting in approximately 75% stenosis of the right cervical ICA origin. Focal intraluminal soft tissue in the proximal right cervical ICA concerning for intraluminal thrombus with approximately 50% luminal narrowing. 2. Approximately 60% stenosis of the origin of the left cervical ICA due to calcified atherosclerosis. 3. Additional atherosclerosis as above. 4. Findings suggestive of vertebrobasilar hypoplasia. Electronically signed by: Donnice Mania MD 06/12/2024 04:40 PM EDT RP Workstation: [id number]   DG Hip Unilat With Pelvis 2-3 Views Left Result Date: 06/12/2024 EXAM: 2 or 3 VIEW(S) XRAY OF THE PELVIS AND LEFT HIP 06/12/2024 12:45:46 PM COMPARISON: 04/25/2021 CLINICAL HISTORY: Fall. FINDINGS: JOINTS: Postoperative changes from previous left total hip arthroplasty. No signs of acute fracture or dislocation. Chronic left superior and inferior pubic rami fractures identified. Degenerative changes noted within the right hip. 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  SOFT TISSUES: Mild osteopenia. IMPRESSION: 1. No acute fracture or dislocation of the left hip. 2. Chronic left superior and inferior pubic rami fractures. 3. Degenerative changes in the right hip. 4. 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 Electronically signed by: Waddell Calk MD 06/12/2024 02:04 PM EDT RP Workstation: [id number]   CT HEAD WO CONTRAST Result Date: 06/12/2024 EXAM: CT HEAD WITHOUT CONTRAST 06/12/2024 12:24:12 PM TECHNIQUE: CT of the head was performed without the administration of intravenous contrast. Automated exposure control, iterative reconstruction, and/or weight based adjustment of the mA/kV was utilized to reduce the radiation dose to as low as reasonably achievable. COMPARISON: 07/13/2021 CLINICAL HISTORY: Neuro deficit, acute, stroke suspected. Pt presents with R side weakness, aphasia that started 8/4. She did have a fall at that time and has been having L hip pain. Pt saw her PCP yesterday and was started on ASA. Pt continued to have progressive worsening of weakness and was having difficulty feeding herself last night. Pt is A/Ox4 during triage with slow slurred speech noted. FINDINGS: BRAIN AND VENTRICLES: No acute hemorrhage. Gray-white differentiation is preserved. No hydrocephalus. No extra-axial collection. No mass effect or midline shift. Nonspecific hypoattenuation in the periventricular and subcortical white matter, most likely representing chronic small vessel disease. Generalized parenchymal volume loss. ORBITS: No acute abnormality. SINUSES: No acute abnormality. SOFT TISSUES AND SKULL: No acute soft tissue abnormality. No skull fracture. VASCULATURE: Atherosclerosis in the carotid siphons. IMPRESSION: 1. No acute intracranial abnormality. 2. Nonspecific hypoattenuation in the periventricular and subcortical white matter, most likely representing chronic small vessel disease. Electronically signed by: Donnice Mania MD 06/12/2024 01:05 PM EDT RP Workstation: [id number]    Time coordinating discharge: over 30 minutes  SIGNED:  Marien LITTIE Piety DO [facility]

## 2024-06-19 NOTE — TOC Transition Note (Addendum)
 Transition of Care [facility]) - Discharge Note   Patient Details  Name: Lori Wallace MRN: [id number] Date of Birth: 09-30-1936  Transition of Care [facility] CM/SW Contact:  [name], RN Phone Number: 06/19/2024, 11:07 AM   Clinical Narrative:     TOC received a message from the patient's nurse asking for an update. TOC explained to the nurse the patient is medically clear and discharge orders were placed yesterday. TOC explained the patient was given the option to appeal.   TOC visited the patient and niece, [name], in the room and asked if they can share when the appeal was started. Per [name], she did not have time to complete the appeal yesterday. [name] is adamant she wants the patient to go to [facility]. TOC explained that [facility] have a bed. TOC reiterated that [facility] provided a 1st and 2nd choice. The first choice was [facility] and the 2nd choice is [facility]. TOC advised the patient's options are [facility] or home.   The patient is medically clear to discharge to [facility]. Per the MD, the patient's niece [name] is agreeable with the discharge plan. [facility] to transport the patient.  TOC received a message advising the patient did not go with transportation. The patient's niece called the insurance company and [facility] and they have a bed. TOC outreached to [facility] and they only have a semi-private room.  TOC and TOC supervisor along with Charge Nurse went to the patients room, her great niece and cousin were present. Her neices [name] and [name] were not available. They were called, but were in a meeting.  TOC reiterated that [facility] only has a semi-private room. Her great niece shared that they want the patient to go to [facility]. TOC explained the patient is medically ready for discharge and has been discharged since yesterday and there is a safe discharge plan in place. TOC explained that we have followed our process. TOC shared updates with  TOC leadership.        Patient Goals and CMS Choice            Discharge Placement                       Discharge Plan and Services Additional resources added to the After Visit Summary for                                       Social Drivers of Health (SDOH) Interventions SDOH Screenings   Food Insecurity: No Food Insecurity (06/12/2024)  Housing: Unknown (06/12/2024)  Transportation Needs: No Transportation Needs (06/12/2024)  Utilities: Not At Risk (06/12/2024)  Financial Resource Strain: Low Risk  (09/28/2023)   Received from [facility]  Social Connections: Unknown (06/12/2024)  Tobacco Use: Medium Risk (06/12/2024)     Readmission Risk Interventions     No data to display

## 2024-06-20 DIAGNOSIS — G459 Transient cerebral ischemic attack, unspecified: Secondary | ICD-10-CM | POA: Diagnosis not present

## 2024-06-20 NOTE — Progress Notes (Signed)
 Contacted [facility] and provided report via telephone [phone] to [place], the nurse assuming care of [name] going to room 113 at this facility.

## 2024-06-20 NOTE — Discharge Summary (Signed)
 Physician Discharge Summary   Patient: Lori Wallace MRN: [id number]  DOB: [birth year]   Admit:     Date of Admission: 06/12/2024 Admitted from: home   Discharge: Date of discharge: 06/20/24 dc orders placed  Disposition: Skilled nursing facility / rehab  Condition at discharge: good  CODE STATUS: DNR     Discharge Physician: Laneta Blunt, DO [facility]    PCP: [name], MD  Recommendations for Outpatient Follow-up:  Follow up with PCP in 2-4 weeks Consider antihypertensives and restarting BB based on continued vitals monitoring. HR WNL off of her BB and BP is elevated to 160s.  Follow as directed with neurology  Follow with vascular surgery if desired endarterectomy   Discharge Diagnoses: Principal Problem:   TIA (transient ischemic attack) Active Problems:   CVA (cerebral vascular accident) [facility])  Hospital course / significant events:   HPI: Lori Wallace is a 88 y.o. female with medical history significant of HTN, skin cancer, breast cancer status post bilateral lumpectomy, presented with strokelike symptoms, including recent L sided weakness, fall w/o LOC, slurred speech. Went to see PCP, suspected TIA, startes on ASA. Pt continues to have speech difficulty and swallowing difficulty, memory problems. Family brought pt to ED 08/06 08/06: hypertensive, bradycardic. Unremarkable CBC, CMP, UA. Admitted to hospitalist for TIA/CVA w/u. Imaging: CTA H/N (+)75% narrowing on the right carotid bifurcation site with signs of distal intramural thrombosis formation. Admitting hospitalist D/W oncall neurology Dr. Lindzen - he recommended carotid doppler to confirm CTA finding and permissive HTN, will see patient in AM to decide next step. MRI brain unable to complete d/t claustrophobia and motion artifact but despite these limitations MRI did reveal acute infarcts noted within the right frontal lobe involving the corona radiata and periventricular white  matter 08/07: Carotid US  today, confirmed stenosis, no mention of thrombus. Vascular surgery consult. Echo EF 60-65, Grade 1 diast df.  08/08: vascular surgery consult - family is leaning toward nonsurgical management but is discussing further before final decision. Await SNF placement for rehab  08/09: family brings MOST form - pt has indicated previously for DNR so orders updated. Pt also indicated limited interventions, was not specific about surgeries but family is leaning toward no endarterectomy / discuss outpatient and treat medically for now.  8/10 through present- patient is medically stable and family elected to not pursue endarterectomy. She is being managed on medications as listed below and will follow up with neurology outpatient. PT/OT evaluated and recommended SNF at dc.  While awaiting her dc to SNF, she had some fluctuation of mentation which is likely expected in acute post- stroke period in underlying suspected dementia at baseline.  In addition to the acute findings on brain imaging, also had chronic changes that would support this suspicion and further evident by caregiver description of her baseline cognition. MRI brain reads: There is periventricular volume loss and findings suggestive of chronic microvascular ischemic changes.Could consider formal workup on outpatient setting once recovered.   Consultants:  Neurology  Vascular surgery   Procedures/Surgeries: none  ASSESSMENT & PLAN:   Acute CVA  Acute left paresis, resolved Acute aphasia, resolved Acute dysphagia Continue plavix , ASA, statin. Did not pursue endarterectomy per family preference which can be readdressed at later date if decides to pursue with vascular surgery.  Continue PT/OT, speech at rehab.   HTN Held atenolol  for permissive HTN, also bradycardic. BP remained elevated intermittently to 160s systolics. Recommend reevaluating and consider starting agent as she was  not on antihypertensives prior to  admission.    Bradycardia- Likely beta blocker effect Holding atenolol  resolved. HR in 70-80s without BB.    Chronic normocytic anemia H&H stable, no symptoms or signs of active bleeding Monitor CBC  Discharge Instructions  Allergies as of 06/20/2024       Reactions   Demerol [meperidine] Nausea And Vomiting   Propoxyphene Nausea And Vomiting   Tape Itching        Medication List     STOP taking these medications    atenolol  25 MG tablet Commonly known as: TENORMIN    ibuprofen  200 MG tablet Commonly known as: ADVIL    PROBIOTIC DAILY PO       TAKE these medications    acetaminophen  325 MG tablet Commonly known as: TYLENOL  Take 2 tablets (650 mg total) by mouth every 4 (four) hours as needed for mild pain (pain score 1-3) or fever (or temp > 37.5 C (99.5 F)).   aspirin  EC 81 MG tablet Take 1 tablet (81 mg total) by mouth daily. Swallow whole.   bisacodyl  10 MG suppository Commonly known as: DULCOLAX Place 1 suppository (10 mg total) rectally daily as needed for moderate constipation.   buPROPion 150 MG 24 hr tablet Commonly known as: WELLBUTRIN XL Take 150 mg by mouth daily.   CALCIUM  MAGNESIUM  PO Take 1 tablet by mouth daily.   cholecalciferol  1000 units tablet Commonly known as: VITAMIN D  Take 1,000 Units by mouth daily.   clopidogrel  75 MG tablet Commonly known as: PLAVIX  Take 1 tablet (75 mg total) by mouth daily.   feeding supplement Liqd Take 237 mLs by mouth 2 (two) times daily between meals.   loratadine  10 MG tablet Commonly known as: CLARITIN  Take 10 mg by mouth daily as needed for allergies.   mirtazapine 15 MG tablet Commonly known as: REMERON Take 15 mg by mouth at bedtime.   REFRESH OP Place 1 drop into both eyes as needed (dry eyes).   rosuvastatin  40 MG tablet Commonly known as: CRESTOR  Take 1 tablet (40 mg total) by mouth daily.   sodium chloride  0.65 % Soln nasal spray Commonly known as: OCEAN Place 1 spray into both  nostrils as needed for congestion.   Zinc  50 MG Tabs Take 50 mg by mouth daily.         Follow-up Information     [name], MD Follow up.   Specialty: Family Medicine Why: hospital follow up Contact information: [address] [facility] [place] [address] [phone]                 Allergies  Allergen Reactions   Demerol [Meperidine] Nausea And Vomiting   Propoxyphene Nausea And Vomiting   Tape Itching    Subjective: pt reports feeling well today. Denies medical complaints. All questions and concerns addressed at time of encounter.   Discharge Exam: BP (!) 141/68 (BP Location: Right Arm)   Pulse 83   Temp 98.3 F (36.8 C) (Oral)   Resp 18   Ht 5' 10 (1.778 m)   Wt 74 kg   SpO2 96%   BMI 23.41 kg/m  General: Pt is alert, awake, not in acute distress Cardiovascular: RRR, S1/S2 +, no rubs, no gallops Respiratory: CTA bilaterally, no wheezing, no rhonchi Abdominal: Soft, NT, ND, bowel sounds + Extremities: no edema, no cyanosis Neuro: pt oriented to year, person, not to place/situation. Remote memory is intact.   The results of  significant diagnostics from this hospitalization (including imaging, microbiology, ancillary and laboratory) are listed below for reference.    Microbiology: No results found for this or any previous visit (from the past 240 hours).   Labs:  Basic Metabolic Panel: Recent Labs  Lab 06/14/24 0358 06/18/24 1107  NA 140 139  K 4.1 3.9  CL 105 107  CO2 26 24  GLUCOSE 134* 130*  BUN 37* 33*  CREATININE 1.22* 1.04*  CALCIUM  9.4 8.9   CBC: Recent Labs  Lab 06/14/24 0358 06/18/24 1107  WBC 11.3* 10.6*  HGB 13.3 12.1  HCT 41.6 37.6  MCV 96.5 95.9  PLT 303 290   Cardiac Enzymes: Recent Labs  Lab 06/13/24 1748  CKTOTAL 115   Urinalysis    Component Value Date/Time   COLORURINE STRAW (A) 06/12/2024 1206   APPEARANCEUR CLEAR (A) 06/12/2024 1206    APPEARANCEUR Clear 09/24/2014 1532   LABSPEC 1.004 (L) 06/12/2024 1206   LABSPEC 1.004 09/24/2014 1532   PHURINE 6.0 06/12/2024 1206   GLUCOSEU NEGATIVE 06/12/2024 1206   GLUCOSEU Negative 09/24/2014 1532   HGBUR NEGATIVE 06/12/2024 1206   BILIRUBINUR NEGATIVE 06/12/2024 1206   BILIRUBINUR Negative 09/24/2014 1532   KETONESUR NEGATIVE 06/12/2024 1206   PROTEINUR NEGATIVE 06/12/2024 1206   UROBILINOGEN 0.2 10/21/2014 1259   NITRITE NEGATIVE 06/12/2024 1206   LEUKOCYTESUR NEGATIVE 06/12/2024 1206   LEUKOCYTESUR Negative 09/24/2014 1532   Sepsis Labs Recent Labs  Lab 06/14/24 0358 06/18/24 1107  WBC 11.3* 10.6*   Microbiology No results found for this or any previous visit (from the past 240 hours). Imaging US  Carotid Bilateral Result Date: 06/13/2024 CLINICAL DATA:  Thrombosis. EXAM: BILATERAL CAROTID DUPLEX ULTRASOUND TECHNIQUE: Elnor scale imaging, color Doppler and duplex ultrasound were performed of bilateral carotid and vertebral arteries in the neck. COMPARISON:  Neck CTA 06/12/2024 FINDINGS: Criteria: Quantification of carotid stenosis is based on velocity parameters that correlate the residual internal carotid diameter with NASCET-based stenosis levels, using the diameter of the distal internal carotid lumen as the denominator for stenosis measurement. The following velocity measurements were obtained: RIGHT ICA: 241 cm/sec CCA: 77 cm/sec SYSTOLIC ICA/CCA RATIO:  3.1 ECA: 259 cm/sec LEFT ICA: 160 cm/sec Bulb: 213 cm/sec CCA: 77 cm/sec SYSTOLIC ICA/CCA RATIO:  2.1 ECA: 287 cm/sec RIGHT CAROTID ARTERY: Irregular echogenic plaque with shadowing at the right carotid bulb. Elevated peak systolic velocity in the proximal external carotid artery compatible with stenosis near the origin. This finding corresponds with the recent CTA findings. Elevated peak systolic velocity in the proximal right internal carotid artery measuring 241 cm/sec. Mid and distal right internal carotid artery are  patent. RIGHT VERTEBRAL ARTERY: Antegrade flow and normal waveform in the right vertebral artery. LEFT CAROTID ARTERY: Echogenic plaque at the left carotid bulb. Elevated peak systolic velocity at the left carotid bulb measuring 214 cm/sec. Elevated peak systolic velocity in the left external carotid artery. Echogenic plaque in the proximal internal carotid artery. Peak systolic velocity in the proximal internal carotid artery is 160 cm/sec. Mid and distal left internal carotid artery are patent. LEFT VERTEBRAL ARTERY: Antegrade flow and normal waveform in the left vertebral artery. IMPRESSION: 1. Atherosclerotic plaque involving bilateral carotid arteries. 2. Estimated degree of stenosis in the right internal carotid artery is greater than 70%. 3. Estimated degree of stenosis at the left carotid bulb and proximal left internal carotid artery is 50-69%. 4. Stenosis involving bilateral external carotid arteries. 5. Patent vertebral arteries bilaterally. Electronically Signed   By: Juliene Philip HERO.D.  On: 06/13/2024 14:41   ECHOCARDIOGRAM COMPLETE Result Date: 06/13/2024    ECHOCARDIOGRAM REPORT   Patient Name:   ANNETT BOXWELL Date of Exam: 06/12/2024 Medical Rec #:  [id number]      Height:       70.0 in Accession #:    [id number]     Weight:       160.0 lb Date of Birth:  1936/03/01       BSA:          1.898 m Patient Age:    87 years       BP:           113/63 mmHg Patient Gender: F              HR:           60 bpm. Exam Location:  [facility] Procedure: 2D Echo, Cardiac Doppler and Color Doppler (Both Spectral and Color            Flow Doppler were utilized during procedure). Indications:     G45.9 TIA  History:         Patient has no prior history of Echocardiogram examinations.                  Risk Factors:Hypertension.  Sonographer:     Carl Coma RDCS Referring Phys:  [id number] CORT ONEIDA MANA Diagnosing Phys: Evalene Lunger MD IMPRESSIONS  1. Left ventricular ejection fraction, by estimation, is 60 to 65%. The  left ventricle has normal function. The left ventricle has no regional wall motion abnormalities. Left ventricular diastolic parameters are consistent with Grade I diastolic dysfunction (impaired relaxation).  2. Right ventricular systolic function is normal. The right ventricular size is normal. Tricuspid regurgitation signal is inadequate for assessing PA pressure.  3. The mitral valve is normal in structure. No evidence of mitral valve regurgitation. No evidence of mitral stenosis.  4. The aortic valve is normal in structure. Aortic valve regurgitation is not visualized. Aortic valve sclerosis/calcification is present, without any evidence of aortic stenosis.  5. The inferior vena cava is normal in size with greater than 50% respiratory variability, suggesting right atrial pressure of 3 mmHg. FINDINGS  Left Ventricle: Left ventricular ejection fraction, by estimation, is 60 to 65%. The left ventricle has normal function. The left ventricle has no regional wall motion abnormalities. Strain was performed and the global longitudinal strain is indeterminate. The left ventricular internal cavity size was normal in size. There is no left ventricular hypertrophy. Left ventricular diastolic parameters are consistent with Grade I diastolic dysfunction (impaired relaxation). Right Ventricle: The right ventricular size is normal. No increase in right ventricular wall thickness. Right ventricular systolic function is normal. Tricuspid regurgitation signal is inadequate for assessing PA pressure. Left Atrium: Left atrial size was normal in size. Right Atrium: Right atrial size was normal in size. Pericardium: There is no evidence of pericardial effusion. Mitral Valve: The mitral valve is normal in structure. Mild mitral annular calcification. No evidence of mitral valve regurgitation. No evidence of mitral valve stenosis. Tricuspid Valve: The tricuspid valve is normal in structure. Tricuspid valve regurgitation is mild . No  evidence of tricuspid stenosis. Aortic Valve: The aortic valve is normal in structure. Aortic valve regurgitation is not visualized. Aortic valve sclerosis/calcification is present, without any evidence of aortic stenosis. Pulmonic Valve: The pulmonic valve was normal in structure. Pulmonic valve regurgitation is not visualized. No evidence of pulmonic stenosis. Aorta: The aortic root is normal in size  and structure. Venous: The inferior vena cava is normal in size with greater than 50% respiratory variability, suggesting right atrial pressure of 3 mmHg. IAS/Shunts: No atrial level shunt detected by color flow Doppler. Additional Comments: 3D was performed not requiring image post processing on an independent workstation and was indeterminate.  LEFT VENTRICLE PLAX 2D LVIDd:         3.90 cm   Diastology LVIDs:         2.20 cm   LV e' medial:    5.71 cm/s LV PW:         1.00 cm   LV E/e' medial:  16.1 LV IVS:        1.00 cm   LV e' lateral:   5.33 cm/s LVOT diam:     2.00 cm   LV E/e' lateral: 17.2 LV SV:         88 LV SV Index:   46 LVOT Area:     3.14 cm  RIGHT VENTRICLE RV Basal diam:  3.20 cm RV S prime:     15.30 cm/s TAPSE (M-mode): 2.8 cm LEFT ATRIUM             Index        RIGHT ATRIUM          Index LA diam:        3.50 cm 1.84 cm/m   RA Area:     8.71 cm LA Vol (A2C):   30.3 ml 15.96 ml/m  RA Volume:   19.70 ml 10.38 ml/m LA Vol (A4C):   28.2 ml 14.85 ml/m LA Biplane Vol: 30.6 ml 16.12 ml/m  AORTIC VALVE LVOT Vmax:   120.50 cm/s LVOT Vmean:  77.750 cm/s LVOT VTI:    0.280 m  AORTA Ao Root diam: 3.30 cm Ao Asc diam:  3.40 cm MITRAL VALVE MV Area (PHT): 2.48 cm     SHUNTS MV Decel Time: 306 msec     Systemic VTI:  0.28 m MV E velocity: 91.70 cm/s   Systemic Diam: 2.00 cm MV A velocity: 129.50 cm/s MV E/A ratio:  0.71 Evalene Lunger MD Electronically signed by Evalene Lunger MD Signature Date/Time: 06/13/2024/2:14:35 PM    Final    MR BRAIN WO CONTRAST Result Date: 06/12/2024 EXAM: MRI BRAIN WITHOUT  CONTRAST 06/12/2024 04:28:07 PM TECHNIQUE: Multiplanar multisequence MRI of the head/brain was performed without the administration of intravenous contrast. Best obtainable images due to patient becoming claustrophobic and refusing to continue exam. Axial and coronal DWI and ADC images were obtained. These images are noted by motion artifact. COMPARISON: Same day CTA head and neck. CLINICAL HISTORY: Transient ischemic attack (TIA). FINDINGS: BRAIN AND VENTRICLES: There are areas of acute infarct within the right frontal lobe involving the corona radiata and periventricular white matter. There is periventricular volume loss and findings suggestive of chronic microvascular ischemic changes. No midline shift. No hydrocephalus. IMPRESSION: 1. Patient unable to complete exam due to claustrophobia. Limited DWI and ADC images obtained. Motion artifact. 2. Within these limitations, acute infarcts noted within the right frontal lobe involving the corona radiata and periventricular white matter. Electronically signed by: Donnice Mania MD 06/12/2024 05:20 PM EDT RP Workstation: [id number]   CT ANGIO HEAD NECK W WO CM Addendum Date: 06/12/2024  ADDENDUM #1  EXAM: CT HEAD WITHOUT CTA HEAD AND NECK WITH AND WITHOUT 06/12/2024 04:10:00 PM TECHNIQUE: CTA of the head and neck was performed with and without the administration of intravenous contrast. Noncontrast CT of the head  with reconstructed 2-D images are also provided for review. Multiplanar 2D and/or 3D reformatted images are provided for review. Automated exposure control, iterative reconstruction, and/or weight based adjustment of the mA/kV was utilized to reduce the radiation dose to as low as reasonably achievable. COMPARISON: CT head earlier same day as well as carotid ultrasound dated 07/01/2024. CLINICAL HISTORY: Stroke/TIA, assess for intracardiac shunt. Pt presents with R side weakness, aphasia that started 8/4. She did have a fall at that time and has been having L  hip pain. Pt saw her PCP yesterday and was started on ASA. FINDINGS: CT HEAD: BRAIN AND VENTRICLES: No acute intracranial hemorrhage. No mass effect. No midline shift. No extra-axial fluid collection. Gray-white differentiation is maintained. No hydrocephalus. ORBITS: No acute abnormality. SINUSES AND MASTOIDS: No acute abnormality. CTA NECK: AORTIC ARCH AND ARCH VESSELS: Mixed atherosclerotic plaque within the visualized aortic arch. Atherosclerosis involves the origin of the left subclavian artery without high-grade stenosis. CERVICAL CAROTID ARTERIES: The right carotid artery is patent from the origin to the skull base. Mixed attenuation atherosclerotic plaque at the carotid bifurcation with prominent noncalcified component extending to the carotid bulb. There is approximately 75% stenosis at the portion of the right cervical ICA within the proximal right cervical ICA. There is intraluminal soft tissue concerning for focal intraluminal prominence visualized on series 6 image 229 and on series 8 image 57. Intraluminal thrombus is associated with approximately 50% luminal narrowing. Additional mixed atherosclerotic plaque in the distal right cervical ICA without hemodynamically significant stenosis. There is bulky calcified atherosclerosis at the left carotid bifurcation resulting in approximately 60% stenosis of the origin of the left cervical ICA. CERVICAL VERTEBRAL ARTERIES: Patent vertebral arteries in the neck. LUNGS AND MEDIASTINUM: Biapical pleural parenchymal scarring. SOFT TISSUES: No acute abnormality. BONES: Degenerative changes in the visualized spine. There is fusion of the C5 and C6 vertebral bodies. CTA HEAD: ANTERIOR CIRCULATION: The intracranial internal carotid arteries are patent bilaterally. The MCAs are patent bilaterally. The ACAs are patent bilaterally. 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 POSTERIOR CIRCULATION: Diminutive appearance of the basilar artery  with significant distal tapering of the basilar artery after the origin of the superior cerebellar arteries. The PCAs are patent bilaterally and are primarily supplied by the anterior circulation. OTHER: No dural venous sinus thrombosis on this non-dedicated study. IMPRESSION: 1. Mixed atherosclerotic plaque at the right carotid bifurcation resulting in approximately 75% stenosis of the right cervical ICA origin. Focal intraluminal soft tissue in the proximal right cervical ICA concerning for intraluminal thrombus with approximately 50% luminal narrowing. 2. Approximately 60% stenosis of the origin of the left cervical ICA due to calcified atherosclerosis. 3. Additional atherosclerosis as above. 4. Findings suggestive of vertebrobasilar hypoplasia. 5. Impression point #1 discussed with Dr. Laurita at 4:42PM on 06/12/24. Electronically signed by: Donnice Mania MD 06/12/2024 04:55 PM EDT RP Workstation: [id number]   Result Date: 06/12/2024 ** ORIGINAL REPORT  EXAM: CT HEAD WITHOUT CTA HEAD AND NECK WITH AND WITHOUT 06/12/2024 04:10:00 PM TECHNIQUE: CTA of the head and neck was performed with and without the administration of intravenous contrast. Noncontrast CT of the head with reconstructed 2-D images are also provided for review. Multiplanar 2D and/or 3D reformatted images are provided for review. Automated exposure control, iterative reconstruction, and/or weight based adjustment of the mA/kV was utilized to reduce the radiation dose to as low as reasonably achievable. COMPARISON: CT head earlier same day as well as carotid ultrasound dated 07/01/2024. CLINICAL HISTORY: Stroke/TIA, assess for  intracardiac shunt. Pt presents with R side weakness, aphasia that started 8/4. She did have a fall at that time and has been having L hip pain. Pt saw her PCP yesterday and was started on ASA. FINDINGS: CT HEAD: BRAIN AND VENTRICLES: No acute intracranial hemorrhage. No mass effect. No midline shift. No extra-axial fluid  collection. Gray-white differentiation is maintained. No hydrocephalus. ORBITS: No acute abnormality. SINUSES AND MASTOIDS: No acute abnormality. CTA NECK: AORTIC ARCH AND ARCH VESSELS: Mixed atherosclerotic plaque within the visualized aortic arch. Atherosclerosis involves the origin of the left subclavian artery without high-grade stenosis. CERVICAL CAROTID ARTERIES: The right carotid artery is patent from the origin to the skull base. Mixed attenuation atherosclerotic plaque at the carotid bifurcation with prominent noncalcified component extending to the carotid bulb. There is approximately 75% stenosis at the portion of the right cervical ICA within the proximal right cervical ICA. There is intraluminal soft tissue concerning for focal intraluminal prominence visualized on series 6 image 229 and on series 8 image 57. Intraluminal thrombus is associated with approximately 50% luminal narrowing. Additional mixed atherosclerotic plaque in the distal right cervical ICA without hemodynamically significant stenosis. There is bulky calcified atherosclerosis at the left carotid bifurcation resulting in approximately 60% stenosis of the origin of the left cervical ICA. CERVICAL VERTEBRAL ARTERIES: Patent vertebral arteries in the neck. LUNGS AND MEDIASTINUM: Biapical pleural parenchymal scarring. SOFT TISSUES: No acute abnormality. BONES: Degenerative changes in the visualized spine. There is fusion of the C5 and C6 vertebral bodies. CTA HEAD: ANTERIOR CIRCULATION: The intracranial internal carotid arteries are patent bilaterally. The MCAs are patent bilaterally. The ACAs are patent bilaterally. 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 POSTERIOR CIRCULATION: Diminutive appearance of the basilar artery with significant distal tapering of the basilar artery after the origin of the superior cerebellar arteries. The PCAs are patent bilaterally and are primarily supplied by the anterior  circulation. OTHER: No dural venous sinus thrombosis on this non-dedicated study. IMPRESSION: 1. Mixed atherosclerotic plaque at the right carotid bifurcation resulting in approximately 75% stenosis of the right cervical ICA origin. Focal intraluminal soft tissue in the proximal right cervical ICA concerning for intraluminal thrombus with approximately 50% luminal narrowing. 2. Approximately 60% stenosis of the origin of the left cervical ICA due to calcified atherosclerosis. 3. Additional atherosclerosis as above. 4. Findings suggestive of vertebrobasilar hypoplasia. Electronically signed by: Donnice Mania MD 06/12/2024 04:40 PM EDT RP Workstation: [id number]   DG Hip Unilat With Pelvis 2-3 Views Left Result Date: 06/12/2024 EXAM: 2 or 3 VIEW(S) XRAY OF THE PELVIS AND LEFT HIP 06/12/2024 12:45:46 PM COMPARISON: 04/25/2021 CLINICAL HISTORY: Fall. FINDINGS: JOINTS: Postoperative changes from previous left total hip arthroplasty. No signs of acute fracture or dislocation. Chronic left superior and inferior pubic rami fractures identified. Degenerative changes noted within the right hip. 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 SOFT TISSUES: Mild osteopenia. IMPRESSION: 1. No acute fracture or dislocation of the left hip. 2. Chronic left superior and inferior pubic rami fractures. 3. Degenerative changes in the right hip. 4. 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 Electronically signed by: Waddell Calk MD 06/12/2024 02:04 PM EDT RP Workstation: [id number]   CT HEAD WO CONTRAST Result Date: 06/12/2024 EXAM: CT HEAD WITHOUT CONTRAST 06/12/2024 12:24:12 PM TECHNIQUE: CT of the head was performed without the administration of intravenous contrast. Automated exposure control, iterative reconstruction, and/or weight based adjustment of the mA/kV was utilized  to reduce the radiation dose to as low as reasonably achievable. COMPARISON: 07/13/2021  CLINICAL HISTORY: Neuro deficit, acute, stroke suspected. Pt presents with R side weakness, aphasia that started 8/4. She did have a fall at that time and has been having L hip pain. Pt saw her PCP yesterday and was started on ASA. Pt continued to have progressive worsening of weakness and was having difficulty feeding herself last night. Pt is A/Ox4 during triage with slow slurred speech noted. FINDINGS: BRAIN AND VENTRICLES: No acute hemorrhage. Gray-white differentiation is preserved. No hydrocephalus. No extra-axial collection. No mass effect or midline shift. Nonspecific hypoattenuation in the periventricular and subcortical white matter, most likely representing chronic small vessel disease. Generalized parenchymal volume loss. ORBITS: No acute abnormality. SINUSES: No acute abnormality. SOFT TISSUES AND SKULL: No acute soft tissue abnormality. No skull fracture. VASCULATURE: Atherosclerosis in the carotid siphons. IMPRESSION: 1. No acute intracranial abnormality. 2. Nonspecific hypoattenuation in the periventricular and subcortical white matter, most likely representing chronic small vessel disease. Electronically signed by: Donnice Mania MD 06/12/2024 01:05 PM EDT RP Workstation: [id number]    Time coordinating discharge: over 30 minutes  SIGNED:  Marien LITTIE Piety DO [facility]

## 2024-06-20 NOTE — Progress Notes (Signed)
 Mobility Specialist - Progress Note   06/20/24 0921  Mobility  Activity Pivoted/transferred to/from BSC;Stood at bedside  Level of Assistance Moderate assist, patient does 50-74%  Assistive Device None  Distance Ambulated (ft) 2 ft  Activity Response Tolerated well  Mobility visit 1 Mobility  Mobility Specialist Start Time (ACUTE ONLY) [id number]  Mobility Specialist Stop Time (ACUTE ONLY) 0920  Mobility Specialist Time Calculation (min) (ACUTE ONLY) 3 min   Pt semi fowler upon entry, utilizing RA. Pt completed bed mob Mod-MinA to bring trunk from sup to sit, able to bring BLE EOB and scoot towards the EOB ModI with increased time. Pt STS ModA +1, transferred to the [facility] via SPT ModA-MinA with +2 for safety (not utilized). Pt left seated on the [facility] with family member present and instructions to use call bell when ready.   America Silvan Mobility Specialist 06/20/24 9:40 AM

## 2024-06-20 NOTE — TOC Progression Note (Signed)
 Transition of Care [facility]) - Progression Note    Patient Details  Name: Lori Wallace MRN: [id number] Date of Birth: Nov 04, 1936  Transition of Care [facility] CM/SW Contact  [name], RN Phone Number: 06/20/2024, 11:09 AM  Clinical Narrative:      Patient has approval for [facility] ID: [id number] 8/14-8/18 Discharge orders were entered on 8/12. The patient is medically appropriate for discharge and has had several safe discharge plans beginning with PR on 8/12. The patient's family has refused to discharge from the facility until there preferences are met. TOC has persued several bed options on the families behalf, including LC, PR, WOM, and now TL. The admissions coordinator at [facility] advised that she told the patient's family that [facility] is out of network with the patient's plan, but they are adamant about going to the facility.   A representative from [facility] called and verbalized they had spoken with the family several times, but were not aware the patient has had discharge orders entered since 8/12. The insurance company was under the impression that the patient was at home. [employer] updated auth for [facility].  TOC outreached to [employer] at [facility]. They will accept the patient today. They need an updated DC summary. TOC made the MD aware of the request and advised nursing to call report to [phone]. RM 113    provided authorization for TL.                    Expected Discharge Plan and Services         Expected Discharge Date: 06/19/24                                     Social Drivers of Health (SDOH) Interventions SDOH Screenings   Food Insecurity: No Food Insecurity (06/12/2024)  Housing: Unknown (06/12/2024)  Transportation Needs: No Transportation Needs (06/12/2024)  Utilities: Not At Risk (06/12/2024)  Financial Resource Strain: Low Risk  (09/28/2023)   Received from [facility]  Social Connections: Unknown (06/12/2024)   Tobacco Use: Medium Risk (06/12/2024)    Readmission Risk Interventions     No data to display

## 2024-06-21 ENCOUNTER — Non-Acute Institutional Stay (SKILLED_NURSING_FACILITY): Admitting: Student

## 2024-06-21 ENCOUNTER — Encounter: Payer: Self-pay | Admitting: Student

## 2024-06-21 DIAGNOSIS — I10 Essential (primary) hypertension: Secondary | ICD-10-CM | POA: Diagnosis not present

## 2024-06-21 DIAGNOSIS — I639 Cerebral infarction, unspecified: Secondary | ICD-10-CM | POA: Diagnosis not present

## 2024-06-21 DIAGNOSIS — Z853 Personal history of malignant neoplasm of breast: Secondary | ICD-10-CM | POA: Insufficient documentation

## 2024-06-21 DIAGNOSIS — M8000XD Age-related osteoporosis with current pathological fracture, unspecified site, subsequent encounter for fracture with routine healing: Secondary | ICD-10-CM

## 2024-06-21 DIAGNOSIS — R7303 Prediabetes: Secondary | ICD-10-CM

## 2024-06-21 DIAGNOSIS — S32509S Unspecified fracture of unspecified pubis, sequela: Secondary | ICD-10-CM

## 2024-06-21 DIAGNOSIS — S129XXD Fracture of neck, unspecified, subsequent encounter: Secondary | ICD-10-CM

## 2024-06-21 HISTORY — DX: Personal history of malignant neoplasm of breast: Z85.3
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
## 2024-06-22 ENCOUNTER — Other Ambulatory Visit: Payer: Self-pay

## 2024-06-22 ENCOUNTER — Emergency Department

## 2024-06-22 ENCOUNTER — Emergency Department
Admission: EM | Admit: 2024-06-22 | Discharge: 2024-06-22 | Disposition: A | Discharge status: Home / Self Care | Attending: Emergency Medicine | Admitting: Emergency Medicine

## 2024-06-22 DIAGNOSIS — Z85828 Personal history of other malignant neoplasm of skin: Secondary | ICD-10-CM | POA: Diagnosis not present

## 2024-06-22 DIAGNOSIS — S0990XA Unspecified injury of head, initial encounter: Secondary | ICD-10-CM | POA: Diagnosis present

## 2024-06-22 DIAGNOSIS — I10 Essential (primary) hypertension: Secondary | ICD-10-CM | POA: Diagnosis not present

## 2024-06-22 DIAGNOSIS — M25552 Pain in left hip: Secondary | ICD-10-CM | POA: Insufficient documentation

## 2024-06-22 DIAGNOSIS — S01112A Laceration without foreign body of left eyelid and periocular area, initial encounter: Secondary | ICD-10-CM | POA: Insufficient documentation

## 2024-06-22 DIAGNOSIS — Z853 Personal history of malignant neoplasm of breast: Secondary | ICD-10-CM | POA: Insufficient documentation

## 2024-06-22 DIAGNOSIS — W1830XA Fall on same level, unspecified, initial encounter: Secondary | ICD-10-CM | POA: Insufficient documentation

## 2024-06-22 DIAGNOSIS — M79641 Pain in right hand: Secondary | ICD-10-CM | POA: Insufficient documentation

## 2024-06-22 MED ORDER — ACETAMINOPHEN 325 MG PO TABS
650.0000 mg | ORAL_TABLET | Freq: Once | ORAL | Status: AC
  Administered 2024-06-22: 650 mg via ORAL
  Filled 2024-06-22: qty 2

## 2024-06-22 MED ORDER — TETRACAINE HCL 0.5 % OP SOLN
1.0000 [drp] | Freq: Once | OPHTHALMIC | Status: AC
  Administered 2024-06-22: 1 [drp] via OPHTHALMIC
  Filled 2024-06-22: qty 4

## 2024-06-22 MED ORDER — FLUORESCEIN SODIUM 1 MG OP STRP
1.0000 | ORAL_STRIP | Freq: Once | OPHTHALMIC | Status: AC
  Administered 2024-06-22: 1 via OPHTHALMIC
  Filled 2024-06-22: qty 1

## 2024-06-22 NOTE — ED Notes (Signed)
Transport here to pick up pt.

## 2024-06-22 NOTE — Discharge Instructions (Signed)
 You are seen in the emergency department following a fall with head injury.  You had a CT scan done of your head and neck that did not show any internal bleeding or broken bones.  You had an x-ray of your left hip and your right hand that did not show any new broken bones.  You can take 1000 mg of Tylenol  every 6 hours as needed for pain control or 650 mg of Tylenol  every 4 hours.  Use ice.  Return to the emergency department for any worsening symptoms.  Continue to work with physical therapy

## 2024-06-22 NOTE — ED Notes (Signed)
 Pt assisted with bedpan

## 2024-06-22 NOTE — ED Provider Notes (Signed)
 [facility] Provider Note    Event Date/Time   First MD Initiated Contact with Patient 06/22/24 [phone]     (approximate)   History   Fall   HPI  Lori Wallace is a 88 y.o. female past medical history significant for hypertension, skin cancer, breast cancer status postlumpectomy, recent hospitalization for strokelike symptoms, presents to the emergency department following a fall.  Patient states that she worked with PT yesterday and was doing really well.  Has been needing a two-person assist.  Got up on her own last night to go to the bathroom.  Had a fall falling forward and hitting her face.  Is at her mental status baseline.  Complaining of some pain to her left eye into her left hip.  Not on anticoagulation but does take aspirin  and Plavix .  No change in vision and does not wear contacts.  Denies any neck pain or chest pain.  On chart review patient had a recent hospitalization and was discharged from the hospital on 06/20/2024, admitted on the sixth following strokelike symptoms.  CTA showed narrowing of the right carotid is 75%.  MRI revealed acute infarcts in the right frontal lobe and corona radiata.  Ultrasound of the carotid confirmed stenosis vascular surgery was consulted.  Patient had a MOST form that showed DNR and limited interventions, patient's family members elected to not pursue carotid endarterectomy and would be medically managed.  Follow-up as an outpatient with neurology.  PT and OT recommended SNF at time of discharge.     Physical Exam   Triage Vital Signs: ED Triage Vitals  Encounter Vitals Group     BP 06/22/24 0451 (!) 151/66     Girls Systolic BP Percentile --      Girls Diastolic BP Percentile --      Boys Systolic BP Percentile --      Boys Diastolic BP Percentile --      Pulse Rate 06/22/24 0451 79     Resp 06/22/24 0451 18     Temp 06/22/24 0451 98.4 F (36.9 C)     Temp Source 06/22/24 0451 Oral     SpO2 06/22/24 0451 99 %      Weight --      Height 06/22/24 0452 5' 10 (1.778 m)     Head Circumference --      Peak Flow --      Pain Score 06/22/24 0452 5     Pain Loc --      Pain Education --      Exclude from Growth Chart --     Most recent vital signs: Vitals:   06/22/24 0451  BP: (!) 151/66  Pulse: 79  Resp: 18  Temp: 98.4 F (36.9 C)  SpO2: 99%    Physical Exam Constitutional:      Appearance: She is well-developed.  HENT:     Head:     Comments: Hematoma to the left eye, superficial abrasion to the left eyebrow that is hemostatic. Eyes:     Extraocular Movements: Extraocular movements intact.     Conjunctiva/sclera:     Left eye: Hemorrhage present.     Pupils: Pupils are equal, round, and reactive to light.     Left eye: No corneal abrasion or fluorescein  uptake. Seidel exam negative.    Slit lamp exam:    Left eye: No photophobia.  Cardiovascular:     Rate and Rhythm: Regular rhythm.  Pulmonary:     Effort:  No respiratory distress.  Abdominal:     General: There is no distension.  Musculoskeletal:        General: Normal range of motion.     Cervical back: Normal range of motion.     Comments: Mild tenderness to palpation to the left hip.  No tenderness to bilateral lower extremities.  Mild tenderness to palpation to the right hand over the distal third finger.  No tenderness to palpation to the midline thoracic or lumbar spine.  No tenderness to palpation to the left upper extremity.  Skin:    General: Skin is warm.  Neurological:     Mental Status: She is alert. Mental status is at baseline.     IMPRESSION / MDM / ASSESSMENT AND PLAN / ED COURSE  I reviewed the triage vital signs and the nursing notes.  Differential diagnosis including intracranial hemorrhage, cervical spine fracture, cervical spine strain, corneal abrasion, fracture  No tachycardic or bradycardic dysrhythmias while on cardiac telemetry.  RADIOLOGY I independently reviewed imaging, my interpretation  of imaging: CT scan of the head with no signs of intracranial hemorrhage.  CT scan of the head with no acute intracranial hemorrhage.  Evolving nonhemorrhagic infarcts from recent CVA.  Left periorbital hematoma without underlying fracture or foreign body.  X-ray imaging noted unchanged left total hip, chronic left suprapubic and inferior pubic ramus fractures.  Degenerative changes and osteopenia.  X-ray of left hand with no acute fracture or dislocation  LABS (all labs ordered are listed, but only abnormal results are displayed) Labs interpreted as -    Labs Reviewed - No data to display   MDM  Patient is tetanus is up-to-date.  Offered pain medication but patient states that her pain was well-controlled at this time.  Most likely had a fall secondary to ambulating without assistance.  Patient is currently at a SNF with rehab and PT.  Not on anticoagulation.  No concern for ligamentous injury or basilar skull fracture.  No signs of fluorescein  uptake on left fluorescein  exam.  Extraocular movements intact with no concern for entrapment.  Chronic fractures to the left hip.  No new fracture.  Has had a prior hip replacement.  Discussed with family member at bedside.  Plan to return back to SNF.  Given return precautions.  No questions at time of discharge.     PROCEDURES:  Critical Care performed: No  Procedures  Patient's presentation is most consistent with acute presentation with potential threat to life or bodily function.   MEDICATIONS ORDERED IN ED: Medications  acetaminophen  (TYLENOL ) tablet 650 mg (650 mg Oral Given 06/22/24 0620)  fluorescein  ophthalmic strip 1 strip (1 strip Left Eye Given by Other 06/22/24 0805)  tetracaine  (PONTOCAINE) 0.5 % ophthalmic solution 1 drop (1 drop Right Eye Given by Other 06/22/24 0806)    FINAL CLINICAL IMPRESSION(S) / ED DIAGNOSES   Final diagnoses:  Injury of head, initial encounter  Left eyelid laceration, initial encounter  Pain of  left hip     Rx / DC Orders   ED Discharge Orders     None        Note:  This document was prepared using Dragon voice recognition software and may include unintentional dictation errors.   Suzanne Kirsch, MD 06/22/24 [phone]

## 2024-06-22 NOTE — ED Triage Notes (Signed)
 Pt presented to ED BIBA from [facility] with c/o fall when getting up to go to bathroom. States she got slipped on with her feet and fell and hit head. Also endorses left hip pain. Laceration noted above left eye, bruising noted to left eye, abrasion noted to bridge of nose. Left eye swollen shut, sclera red. Pt on plavix . Denies LOC.

## 2024-06-23 ENCOUNTER — Encounter: Payer: Self-pay | Admitting: Student

## 2024-06-24 ENCOUNTER — Non-Acute Institutional Stay (SKILLED_NURSING_FACILITY): Admitting: Student

## 2024-06-24 ENCOUNTER — Encounter: Payer: Self-pay | Admitting: Student

## 2024-06-24 DIAGNOSIS — S01112D Laceration without foreign body of left eyelid and periocular area, subsequent encounter: Secondary | ICD-10-CM

## 2024-06-24 DIAGNOSIS — R296 Repeated falls: Secondary | ICD-10-CM | POA: Diagnosis not present

## 2024-06-24 DIAGNOSIS — M1990 Unspecified osteoarthritis, unspecified site: Secondary | ICD-10-CM

## 2024-06-24 DIAGNOSIS — R58 Hemorrhage, not elsewhere classified: Secondary | ICD-10-CM | POA: Diagnosis not present

## 2024-06-24 LAB — BASIC METABOLIC PANEL WITH GFR
BUN: 27 — AB (ref 4–21)
CO2: 30 — AB (ref 13–22)
Chloride: 104 (ref 99–108)
Creatinine: 1 (ref 0.5–1.1)
Glucose: 92
Potassium: 4.6 meq/L (ref 3.5–5.1)
Sodium: 139 (ref 137–147)

## 2024-06-24 LAB — CBC AND DIFFERENTIAL
HCT: 27 — AB (ref 36–46)
Hemoglobin: 9 — AB (ref 12.0–16.0)
Neutrophils Absolute: 6785
Platelets: 330 K/uL (ref 150–400)
WBC: 11.5

## 2024-06-24 LAB — COMPREHENSIVE METABOLIC PANEL WITH GFR
Calcium: 8.4 — AB (ref 8.7–10.7)
eGFR: 55

## 2024-06-24 LAB — CBC: RBC: 2.85 — AB (ref 3.87–5.11)

## 2024-06-24 NOTE — Progress Notes (Signed)
 Location:  [facility] Room Number: [facility] 113-A Place of Service:  SNF [phone]) Provider: Navi Ewton,MD  Code Status: DNR Goals of Care:     06/24/2024   11:16 AM  Advanced Directives  Does Patient Have a Medical Advance Directive? No  Does patient want to make changes to medical advance directive? No - Patient declined     Chief Complaint  Patient presents with   Hospitalization Follow-up    Emergency Dept Follow up.     HPI: Patient is a 88 y.o. female seen today for Emergency Room Follow up History of Present Illness The patient presents with a fall resulting in a left eyelid laceration and bilateral eye bruising.  She experienced a fall on Friday night, leading to a laceration on her left eyelid and extensive bruising around both eyes. No stitches or sutures were placed on the eyelid during the emergency department visit. A CT scan of her head was negative for any intracranial processes.  An X-ray of her right hand showed severe degenerative changes but no fractures. An X-ray of her hip and pelvis showed an unchanged appearance of the left total hip arthroplasty with no signs of dislocation or periprosthetic fracture.  She has a history of a previous stroke that was thought to have contributed to her fall. A new left hematoma was noted, but no fracture was present. She mentions losing her balance as the cause of the fall.   Results RADIOLOGY Right hand X-ray: Severe degenerative changes, no fractures (06/21/2024) Hip and pelvis X-ray: Unchanged appearance of left total hip arthroplasty, no dislocation or periprosthetic fracture (06/21/2024) CT head: Negative for intracranial processes (06/21/2024)    Past Medical History:  Diagnosis Date   Actinic keratosis    Arthritis    Breast cancer (HCC) [phone]   Cancer (HCC)    breast s/p bilateral lumpectomies with radiation,   Complication of anesthesia    Pt stated her top lip was  puffed up and felt strange for weeks after having hip surgery. Pt thinks intubation appliance may have caused it   Constipation due to pain medication    Environmental and seasonal allergies    Frequency of urination    Hx of basal cell carcinoma 08/19/2010   Mid dorsum nose supra tip   Hypertension    pt has not had any blood pressure medications in over 4 years   Malignant neoplasm of upper-outer quadrant of female breast (HCC) 1995   [place]  wide excision, reexcision, whole breast radiation.   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 T1a   Malignant neoplasm of upper-outer quadrant of female breast (HCC) 2002   Right breast   Squamous cell carcinoma of skin 11/23/2007   Right lateral upper eyelid. SCCis   Squamous cell carcinoma of skin 09/03/2019   Right lateral pretibia. EDC.    Squamous cell carcinoma of skin 03/30/2020   R lateral pretibia    Squamous cell carcinoma of skin 03/09/2022   R upper arm, EDC   Trigger finger of both hands     Past Surgical History:  Procedure Laterality Date   BACK SURGERY  1960s   lumbar ruptured discs   BREAST BIOPSY     BREAST LUMPECTOMY Left 1995 with Radiation   BREAST LUMPECTOMY Right 2002   with Radiation   BREAST LUMPECTOMY Left 2011   BREAST SURGERY Bilateral  lumpectomies x 3   COLONOSCOPY  2006   Dr. Viktoria   EYE SURGERY  1990s   bilat cataract removal   FRACTURE SURGERY     L wrist repair, R lower leg   JOINT REPLACEMENT     R knee w/prior arhtroscopy   KYPHOPLASTY N/A 10/23/2014   Procedure: KYPHOPLASTY;  Surgeon: Oneil Rodgers Priestly, MD;  Location: [facility] OR;  Service: Orthopedics;  Laterality: N/A;  T 12 kyphoplasty   TOTAL HIP ARTHROPLASTY  07/30/2012   Procedure: TOTAL HIP ARTHROPLASTY;  Surgeon: Dempsey JINNY Sensor, MD;  Location: [facility] OR;  Service: Orthopedics;  Laterality: Left;   uterine tumor excision      benign   WRIST SURGERY Left     Allergies  Allergen Reactions   Ativan  [Lorazepam ]    Demerol [Meperidine] Nausea And Vomiting   Oxycodone     Propoxyphene Nausea And Vomiting   Tape Itching    Outpatient Encounter Medications as of 06/24/2024  Medication Sig   aspirin  EC 81 MG tablet Take 1 tablet (81 mg total) by mouth daily. Swallow whole.   Calcium -Magnesium -Vitamin D  (CALCIUM  MAGNESIUM  PO) Take 1 tablet by mouth daily.   cholecalciferol  (VITAMIN D ) 1000 UNITS tablet Take 1,000 Units by mouth daily.   clopidogrel  (PLAVIX ) 75 MG tablet Take 1 tablet (75 mg total) by mouth daily.   losartan (COZAAR) 25 MG tablet Take 25 mg by mouth daily.   rosuvastatin  (CRESTOR ) 40 MG tablet Take 1 tablet (40 mg total) by mouth daily.   Zinc  50 MG TABS Take 50 mg by mouth daily.   acetaminophen  (TYLENOL ) 325 MG tablet Take 2 tablets (650 mg total) by mouth every 4 (four) hours as needed for mild pain (pain score 1-3) or fever (or temp > 37.5 C (99.5 F)).   bisacodyl  (DULCOLAX) 10 MG suppository Place 1 suppository (10 mg total) rectally daily as needed for moderate constipation.   feeding supplement (ENSURE PLUS HIGH PROTEIN) LIQD Take 237 mLs by mouth 2 (two) times daily between meals.   loratadine  (CLARITIN ) 10 MG tablet Take 10 mg by mouth daily as needed for allergies.   Polyvinyl Alcohol -Povidone (REFRESH OP) Place 1 drop into both eyes as needed (dry eyes).    sodium chloride  (OCEAN) 0.65 % SOLN nasal spray Place 1 spray into both nostrils as needed for congestion.   No facility-administered encounter medications on file as of 06/24/2024.    Review of Systems:  Review of Systems  Health Maintenance  Topic Date Due   Medicare Annual Wellness (AWV)  Never done   COVID-19 Vaccine (1) Never done   Zoster Vaccines- Shingrix (1 of 2) Never done   Pneumococcal Vaccine: 50+ Years (1 of 1 - PCV) Never done   DEXA SCAN  Never done   DTaP/Tdap/Td (2 - Td or Tdap) 01/23/2022   INFLUENZA VACCINE   06/07/2024   HPV VACCINES  Aged Out   Meningococcal B Vaccine  Aged Out    Physical Exam: Vitals:   06/24/24 1100  BP: 122/63  Pulse: 78  Resp: 16  Temp: 97.6 F (36.4 C)  SpO2: 97%  Weight: 153 lb (69.4 kg)  Height: 5' 10 (1.778 m)   Body mass index is 21.95 kg/m. Physical Exam HEENT: Extensive bruising of bilateral eyes. Redness in left sclera. NEUROLOGICAL: Pupils equal and reactive to light. Labs reviewed: Basic Metabolic Panel: Recent Labs    06/12/24 1136 06/14/24 0358 06/18/24 1107  NA 139 140 139  K 4.0 4.1 3.9  CL 104 105 107  CO2 27 26 24   GLUCOSE 94 134* 130*  BUN 25* 37* 33*  CREATININE 1.06* 1.22* 1.04*  CALCIUM  9.2 9.4 8.9   Liver Function Tests: Recent Labs    06/12/24 1136  AST 20  ALT 15  ALKPHOS 63  BILITOT 1.0  PROT 7.7  ALBUMIN 3.7   No results for input(s): LIPASE, AMYLASE in the last 8760 hours. No results for input(s): AMMONIA in the last 8760 hours. CBC: Recent Labs    06/12/24 1136 06/14/24 0358 06/18/24 1107  WBC 9.3 11.3* 10.6*  NEUTROABS 5.5  --   --   HGB 11.9* 13.3 12.1  HCT 37.0 41.6 37.6  MCV 96.6 96.5 95.9  PLT 265 303 290   Lipid Panel: Recent Labs    06/13/24 0336  CHOL 208*  HDL 77  LDLCALC 109*  TRIG 109  CHOLHDL 2.7   Lab Results  Component Value Date   HGBA1C 5.8 (H) 06/12/2024    Procedures since last visit: DG Hand 2 View Right Result Date: 06/22/2024 EXAM: 1 or 2 VIEW(S) XRAY OF THE RIGHT HAND 06/22/2024 07:46:00 AM COMPARISON: None available. CLINICAL HISTORY: Fall. Right hand pain after fall. FINDINGS: BONES AND JOINTS: Moderate-to-severe degenerative changes are noted involving the IP joints with fingers fixed in flexion. Flexion deformities of the digits diminishes exam detail due to suboptimal patient positioning. Bones are osteopenic. Remote healed fracture deformity involving the mid and distal aspect of the fourth metacarpal bone. No sign of acute fracture or dislocation. SOFT  TISSUES: The soft tissues are unremarkable. IMPRESSION: 1. No acute osseous abnormality. 2. Moderate-to-severe degenerative changes involving the IP joints with fingers fixed in flexion. Evaluation is limited by suboptimal patient positioning. Electronically signed by: Waddell Calk MD 06/22/2024 07:51 AM EDT RP Workstation: [id number]   DG Hip Unilat W or Wo Pelvis 2-3 Views Left Result Date: 06/22/2024 EXAM: 2 or 3 VIEW(S) XRAY OF THE PELVIS AND LEFT HIP 06/22/2024 06:20:10 AM COMPARISON: 06/12/2024 CLINICAL HISTORY: Left hip pain. FINDINGS: JOINTS: Unchanged appearance of left total hip arthroplasty. No signs of dislocation or periprosthetic fracture. Degenerative changes noted within the right hip. Similar appearance of chronic left superior and inferior pubic rami fractures. SOFT TISSUES: The soft tissues are unremarkable. BONES: Diffuse osteopenia. IMPRESSION: 1. Unchanged appearance of left total hip arthroplasty with no signs of dislocation or periprosthetic fracture. 2. Chronic left superior and inferior pubic rami fractures. 3. Degenerative changes in the right hip. 4. Diffuse osteopenia. Electronically signed by: Waddell Calk MD 06/22/2024 07:09 AM EDT RP Workstation: [id number]   CT Cervical Spine Wo Contrast Result Date: 06/22/2024 EXAM: CT CERVICAL SPINE WITHOUT CONTRAST 06/22/2024 06:11:28 AM TECHNIQUE: CT of the cervical spine was performed without the administration of intravenous contrast. Multiplanar reformatted images are provided for review. Automated exposure control, iterative reconstruction, and/or weight based adjustment of the mA/kV was utilized to reduce the radiation dose to as low as reasonably achievable. COMPARISON: CT of the cervical spine 04/25/2021. CLINICAL HISTORY: Patient presented to the ED after a fall with head impact. Noted laceration above the left eye, bruising to the left eye, and abrasion to the bridge of the nose. Patient is on Plavix  and denies loss of  consciousness. FINDINGS: CERVICAL SPINE: BONES AND ALIGNMENT: No acute fracture or traumatic malalignment. DEGENERATIVE CHANGES: Ankylosis is present across the C5-6 disc space. Multilevel endplate degenerative changes are present with uncovertebral spurring at C3-4, C4-5, and C6-7. Slight degenerative anterolisthesis at C7-T1 is stable. SOFT TISSUES: No  prevertebral soft tissue swelling. VASCULATURE: Atherosclerotic calcifications are present at the carotid bifurcations bilaterally. IMPRESSION: 1. No acute abnormality of the cervical spine related to the fall and head injury. 2. Ankylosis across the C5-6 disc space and multilevel endplate degenerative changes with uncovertebral spurring at C3-4, C4-5, and C6-7. Slight degenerative anterolisthesis at C7-T1 is stable. Electronically signed by: Lonni Necessary MD 06/22/2024 06:30 AM EDT RP Workstation: [id number]   CT Head Wo Contrast Result Date: 06/22/2024 EXAM: CT HEAD WITHOUT CONTRAST 06/22/2024 06:11:28 AM TECHNIQUE: CT of the head was performed without the administration of intravenous contrast. Automated exposure control, iterative reconstruction, and/or weight based adjustment of the mA/kV was utilized to reduce the radiation dose to as low as reasonably achievable. COMPARISON: MR head without contrast 06/12/2024. CT head without contrast 06/12/2024. CLINICAL HISTORY: Patient presented to the emergency department after a fall with head trauma and abnormal mental status. Noted left periorbital hematoma, laceration above the left eye, and bruising to the left eye. Patient is on Plavix  and denies loss of consciousness. FINDINGS: BRAIN AND VENTRICLES: No acute intracranial trauma. Evolving nonhemorrhagic infarcts of the right frontal and parietal white matter are noted. Moderate atrophy and white matter changes are present. ORBITS: Left periorbital hematoma is present. No underlying fracture or foreign bodies present. Bilateral lens replacements are noted.  The globes and orbits are otherwise within normal limits. SINUSES: No acute abnormality. SOFT TISSUES AND SKULL: No acute soft tissue abnormality. No skull fracture. VASCULATURE: Atherosclerotic calcifications are present in the cavernous carotid arteries bilaterally. No hyperdense vessel is present. IMPRESSION: 1. No acute intracranial abnormality related to the reported head trauma. 2. Evolving nonhemorrhagic infarcts of the right frontal and parietal white matter. 3. Otherwise stable moderate atrophy and white matter changes. 4. Left periorbital hematoma without underlying fracture or foreign body. Electronically signed by: Lonni Necessary MD 06/22/2024 06:27 AM EDT RP Workstation: [id number]    Assessment/Plan Assessment and Plan Fall with left eyelid laceration and bilateral periorbital ecchymosis Recent fall resulted in a left eyelid laceration and bilateral periorbital ecchymosis. No sutures were required. CT head was negative for intracranial processes, and no fractures were identified in the hip, cervical spine, or right hand. Likely due to loss of footing. Intraocular movement is intact, and pupils are equal and reactive. Left sclera shows redness due to bruising. - Ensure nursing staff assists with mobility and bathroom needs. - Emphasize the importance of requesting assistance to prevent future falls.  Severe degenerative joint disease of the right hand X-ray reveals severe degenerative changes without fractures. The condition is chronic and contributes to functional limitations. Labs/tests ordered:  * No order type specified * Next appt:  Visit date not found

## 2024-07-23 ENCOUNTER — Non-Acute Institutional Stay (SKILLED_NURSING_FACILITY): Admitting: Nurse Practitioner

## 2024-07-23 DIAGNOSIS — H01005 Unspecified blepharitis left lower eyelid: Secondary | ICD-10-CM | POA: Diagnosis not present

## 2024-07-23 NOTE — Progress Notes (Signed)
 Location:  Other Nursing Home Room Number: [place] Place of Service:  SNF (31)  [name], MD  Patient Care Team: [name], MD as PCP - General (Family Medicine) Dessa, Reyes ORN, MD (General Surgery)  Extended Emergency Contact Information Primary Emergency Contact: Jasper,Teresa Mobile Phone: [phone] Relation: Niece Secondary Emergency Contact: Jones,[name] Mobile Phone: [phone] Relation: Niece  Goals of care: Advanced Directive information    06/24/2024   11:16 AM  Advanced Directives  Does Patient Have a Medical Advance Directive? No  Does patient want to make changes to medical advance directive? No - Patient declined     Chief Complaint  Patient presents with   Acute Visit    Left eye concerns    HPI:  Pt is a 88 y.o. female seen today for an acute visit for left eye being red with drainage.  Nursing reports crust to eye and redness to eye which started about 2 days ago.  She denies pain, blurred vision at this time Reports she feels crud on the lids but does not continuously drain  No itching No redness to eye or surrounding area.    Past Medical History:  Diagnosis Date   Actinic keratosis    Arthritis    Breast cancer (HCC) [phone]   Cancer [facility])    breast s/p bilateral lumpectomies with radiation,   Complication of anesthesia    Pt stated her top lip was puffed up and felt strange for weeks after having hip surgery. Pt thinks intubation appliance may have caused it   Constipation due to pain medication    Environmental and seasonal allergies    Frequency of urination    Hx of basal cell carcinoma 08/19/2010   Mid dorsum nose supra tip   Hypertension    pt has not had any blood pressure medications in over 4 years   Malignant neoplasm of upper-outer quadrant of female breast (HCC) 1995   [place]  wide excision, reexcision, whole breast radiation.   Malignant neoplasm of upper-outer quadrant of female breast  (HCC) 11/2010   Left:Invasive ductal carcinoma ER 90%, PR 90%, HER-2/neu not over dressing. T1a   Malignant neoplasm of upper-outer quadrant of female breast (HCC) 2002   Right breast   Squamous cell carcinoma of skin 11/23/2007   Right lateral upper eyelid. SCCis   Squamous cell carcinoma of skin 09/03/2019   Right lateral pretibia. EDC.    Squamous cell carcinoma of skin 03/30/2020   R lateral pretibia    Squamous cell carcinoma of skin 03/09/2022   R upper arm, EDC   Trigger finger of both hands    Past Surgical History:  Procedure Laterality Date   BACK SURGERY  1960s   lumbar ruptured discs   BREAST BIOPSY     BREAST LUMPECTOMY Left 1995 with Radiation   BREAST LUMPECTOMY Right 2002   with Radiation   BREAST LUMPECTOMY Left 2011   BREAST SURGERY Bilateral    lumpectomies x 3   COLONOSCOPY  2006   Dr. Viktoria   EYE SURGERY  1990s   bilat cataract removal   FRACTURE SURGERY     L wrist repair, R lower leg   JOINT REPLACEMENT     R knee w/prior arhtroscopy   KYPHOPLASTY N/A 10/23/2014   Procedure: KYPHOPLASTY;  Surgeon: Oneil Rodgers Priestly, MD;  Location: [facility] OR;  Service: Orthopedics;  Laterality: N/A;  T 12 kyphoplasty   TOTAL HIP ARTHROPLASTY  07/30/2012   Procedure: TOTAL HIP ARTHROPLASTY;  Surgeon:  Dempsey JINNY Sensor, MD;  Location: [facility] OR;  Service: Orthopedics;  Laterality: Left;   uterine tumor excision     benign   WRIST SURGERY Left     Allergies  Allergen Reactions   Ativan  [Lorazepam ]    Demerol [Meperidine] Nausea And Vomiting   Oxycodone     Propoxyphene Nausea And Vomiting   Tape Itching    Outpatient Encounter Medications as of 07/23/2024  Medication Sig   acetaminophen  (TYLENOL ) 325 MG tablet Take 2 tablets (650 mg total) by mouth every 4 (four) hours as needed for mild pain (pain score 1-3) or fever (or temp > 37.5 C (99.5 F)).   aspirin  EC 81 MG tablet Take 1 tablet (81 mg total) by mouth daily. Swallow whole.   bisacodyl  (DULCOLAX) 10 MG  suppository Place 1 suppository (10 mg total) rectally daily as needed for moderate constipation.   Calcium -Magnesium -Vitamin D  (CALCIUM  MAGNESIUM  PO) Take 1 tablet by mouth daily.   cholecalciferol  (VITAMIN D ) 1000 UNITS tablet Take 1,000 Units by mouth daily.   clopidogrel  (PLAVIX ) 75 MG tablet Take 1 tablet (75 mg total) by mouth daily.   feeding supplement (ENSURE PLUS HIGH PROTEIN) LIQD Take 237 mLs by mouth 2 (two) times daily between meals.   loratadine  (CLARITIN ) 10 MG tablet Take 10 mg by mouth daily as needed for allergies.   losartan (COZAAR) 25 MG tablet Take 25 mg by mouth daily.   Polyvinyl Alcohol -Povidone (REFRESH OP) Place 1 drop into both eyes as needed (dry eyes).    rosuvastatin  (CRESTOR ) 40 MG tablet Take 1 tablet (40 mg total) by mouth daily.   sodium chloride  (OCEAN) 0.65 % SOLN nasal spray Place 1 spray into both nostrils as needed for congestion.   Zinc  50 MG TABS Take 50 mg by mouth daily.   No facility-administered encounter medications on file as of 07/23/2024.    Review of Systems  Constitutional:  Negative for activity change and appetite change.  Eyes:  Negative for pain, discharge, redness, itching and visual disturbance.    Immunization History  Administered Date(s) Administered   PPD Test 05/14/2021   Tdap 01/24/2012   Pertinent  Health Maintenance Due  Topic Date Due   DEXA SCAN  Never done   Influenza Vaccine  Never done      04/26/2021   10:00 PM 04/27/2021    7:35 AM 04/27/2021    8:40 PM 04/28/2021    7:34 AM 05/04/2021   11:08 AM  Fall Risk  (RETIRED) Patient Fall Risk Level High fall risk  High fall risk  High fall risk  High fall risk  High fall risk      Data saved with a previous flowsheet row definition   Functional Status Survey:    Vitals:   07/23/24 1444  BP: 126/68  Pulse: 72  Resp: 18  Temp: (!) 97 F (36.1 C)  SpO2: 96%   There is no height or weight on file to calculate BMI. Physical Exam Constitutional:       Appearance: Normal appearance.  HENT:     Head: Normocephalic and atraumatic.  Eyes:     Extraocular Movements: Extraocular movements intact.     Conjunctiva/sclera: Conjunctivae normal.     Pupils: Pupils are equal, round, and reactive to light.     Comments: Crusting noted to left lower lid. No redness, swelling noted  Pulmonary:     Effort: Pulmonary effort is normal.  Neurological:     Mental Status: She is alert. Mental  status is at baseline.  Psychiatric:        Mood and Affect: Mood normal.     Labs reviewed: Recent Labs    06/12/24 1136 06/14/24 0358 06/18/24 1107  NA 139 140 139  K 4.0 4.1 3.9  CL 104 105 107  CO2 27 26 24   GLUCOSE 94 134* 130*  BUN 25* 37* 33*  CREATININE 1.06* 1.22* 1.04*  CALCIUM  9.2 9.4 8.9   Recent Labs    06/12/24 1136  AST 20  ALT 15  ALKPHOS 63  BILITOT 1.0  PROT 7.7  ALBUMIN 3.7   Recent Labs    06/12/24 1136 06/14/24 0358 06/18/24 1107  WBC 9.3 11.3* 10.6*  NEUTROABS 5.5  --   --   HGB 11.9* 13.3 12.1  HCT 37.0 41.6 37.6  MCV 96.6 96.5 95.9  PLT 265 303 290   No results found for: TSH Lab Results  Component Value Date   HGBA1C 5.8 (H) 06/12/2024   Lab Results  Component Value Date   CHOL 208 (H) 06/13/2024   HDL 77 06/13/2024   LDLCALC 109 (H) 06/13/2024   TRIG 109 06/13/2024   CHOLHDL 2.7 06/13/2024    Significant Diagnostic Results in last 30 days:  No results found.  Assessment/Plan  1. Blepharitis of left lower eyelid, unspecified type (Primary) To use warm compresses BID Wash lids with baby shampoo BID twice daily and then to make sure to wash thoroughly there after Can add artifical tears if needed PRN  Lori Wallace K. Caro BODILY [facility] [phone]

## 2024-07-25 ENCOUNTER — Encounter: Payer: Self-pay | Admitting: Nurse Practitioner

## 2024-07-25 ENCOUNTER — Non-Acute Institutional Stay (SKILLED_NURSING_FACILITY): Payer: Self-pay | Admitting: Nurse Practitioner

## 2024-07-25 DIAGNOSIS — R296 Repeated falls: Secondary | ICD-10-CM

## 2024-07-25 DIAGNOSIS — R7303 Prediabetes: Secondary | ICD-10-CM

## 2024-07-25 DIAGNOSIS — D649 Anemia, unspecified: Secondary | ICD-10-CM

## 2024-07-25 DIAGNOSIS — E785 Hyperlipidemia, unspecified: Secondary | ICD-10-CM

## 2024-07-25 DIAGNOSIS — I10 Essential (primary) hypertension: Secondary | ICD-10-CM | POA: Diagnosis not present

## 2024-07-25 DIAGNOSIS — M8000XD Age-related osteoporosis with current pathological fracture, unspecified site, subsequent encounter for fracture with routine healing: Secondary | ICD-10-CM

## 2024-07-25 DIAGNOSIS — M1990 Unspecified osteoarthritis, unspecified site: Secondary | ICD-10-CM

## 2024-07-25 DIAGNOSIS — I6930 Unspecified sequelae of cerebral infarction: Secondary | ICD-10-CM

## 2024-07-25 NOTE — Progress Notes (Signed)
 Location:  Other [facility].  Nursing Home Room Number: [facility] [id number] Place of Service:  SNF (31) Harlene An, NP  PCP: [name], MD  Patient Care Team: [name], MD as PCP - General (Family Medicine) Dessa Reyes ORN, MD (General Surgery)  Extended Emergency Contact Information Primary Emergency Contact: Jasper,Teresa Mobile Phone: [phone] Relation: Niece Secondary Emergency Contact: Jones,[name] Mobile Phone: [phone] Relation: Niece  Goals of care: Advanced Directive information    06/24/2024   11:16 AM  Advanced Directives  Does Patient Have a Medical Advance Directive? No  Does patient want to make changes to medical advance directive? No - Patient declined     Chief Complaint  Patient presents with   Medical Management of Chronic Issues    Medical Management of Chronic Issues.     HPI:  Pt is a 88 y.o. female seen today for medical management of chronic disease.  she is at [facility] after being hospitalized for left sided weakness and difficulty walking.  Noted to have CVA with left sided weakness. Reports this has improved. She reports she is doing better at walking and feeling more secure with herself.  She is currently on ASA 81 mg daily and plavix  She reports she does not feel like she can go home by herself due to gait. Plan is for her to stay at [facility] for LTC.   Reports she does not have pain.  Reports she is eating well.   No additional drainage to left eye. Not painful under eye but ongoing tenderness over eye- she had a fall and had laceration over left eye- laceration has healed but remains tender.    Past Medical History:  Diagnosis Date   Actinic keratosis    Arthritis    Breast cancer (HCC) [phone]   Cancer [facility])    breast s/p bilateral lumpectomies with radiation,   Complication of anesthesia    Pt stated her top lip was puffed up and felt strange for weeks after having hip surgery. Pt  thinks intubation appliance may have caused it   Constipation due to pain medication    Environmental and seasonal allergies    Frequency of urination    Hx of basal cell carcinoma 08/19/2010   Mid dorsum nose supra tip   Hypertension    pt has not had any blood pressure medications in over 4 years   Malignant neoplasm of upper-outer quadrant of female breast (HCC) 1995   [place]  wide excision, reexcision, whole breast radiation.   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 T1a   Malignant neoplasm of upper-outer quadrant of female breast (HCC) 2002   Right breast   Squamous cell carcinoma of skin 11/23/2007   Right lateral upper eyelid. SCCis   Squamous cell carcinoma of skin 09/03/2019   Right lateral pretibia. EDC.    Squamous cell carcinoma of skin 03/30/2020   R lateral pretibia    Squamous cell carcinoma of skin 03/09/2022   R upper arm, EDC   Trigger finger of both hands    Past Surgical History:  Procedure Laterality Date   BACK SURGERY  1960s   lumbar ruptured discs   BREAST BIOPSY     BREAST LUMPECTOMY Left 1995 with Radiation   BREAST LUMPECTOMY Right 2002   with Radiation   BREAST LUMPECTOMY Left 2011   BREAST SURGERY Bilateral    lumpectomies x 3  COLONOSCOPY  2006   Dr. Viktoria   EYE SURGERY  1990s   bilat cataract removal   FRACTURE SURGERY     L wrist repair, R lower leg   JOINT REPLACEMENT     R knee w/prior arhtroscopy   KYPHOPLASTY N/A 10/23/2014   Procedure: KYPHOPLASTY;  Surgeon: Oneil Rodgers Priestly, MD;  Location: [facility];  Service: Orthopedics;  Laterality: N/A;  T 12 kyphoplasty   TOTAL HIP ARTHROPLASTY  07/30/2012   Procedure: TOTAL HIP ARTHROPLASTY;  Surgeon: Dempsey JINNY Sensor, MD;  Location: [facility] OR;  Service: Orthopedics;  Laterality: Left;   uterine tumor excision     benign   WRIST SURGERY Left     Allergies  Allergen Reactions    Ativan  [Lorazepam ]    Demerol [Meperidine] Nausea And Vomiting   Oxycodone     Propoxyphene Nausea And Vomiting   Tape Itching    Outpatient Encounter Medications as of 07/25/2024  Medication Sig   acetaminophen  (TYLENOL ) 325 MG tablet Take 2 tablets (650 mg total) by mouth every 4 (four) hours as needed for mild pain (pain score 1-3) or fever (or temp > 37.5 C (99.5 F)).   acetaminophen  (TYLENOL ) 325 MG tablet Take 650 mg by mouth 3 (three) times daily.   aspirin  EC 81 MG tablet Take 1 tablet (81 mg total) by mouth daily. Swallow whole.   bisacodyl  (DULCOLAX) 10 MG suppository Place 1 suppository (10 mg total) rectally daily as needed for moderate constipation.   Calcium -Magnesium -Vitamin D  (CALCIUM  MAGNESIUM  PO) Take 1 tablet by mouth daily.   cholecalciferol  (VITAMIN D ) 1000 UNITS tablet Take 1,000 Units by mouth daily.   clopidogrel  (PLAVIX ) 75 MG tablet Take 1 tablet (75 mg total) by mouth daily.   diclofenac Sodium (VOLTAREN) 1 % GEL Apply 4 g topically every 6 (six) hours as needed.   feeding supplement (ENSURE PLUS HIGH PROTEIN) LIQD Take 237 mLs by mouth 2 (two) times daily between meals.   loratadine  (CLARITIN ) 10 MG tablet Take 10 mg by mouth daily as needed for allergies.   losartan (COZAAR) 25 MG tablet Take 25 mg by mouth daily.   melatonin 5 MG TABS Take 5 mg by mouth at bedtime.   mirtazapine (REMERON) 7.5 MG tablet Take 15 mg by mouth at bedtime.   Polyvinyl Alcohol -Povidone (REFRESH OP) Place 1 drop into both eyes as needed (dry eyes).    rosuvastatin  (CRESTOR ) 40 MG tablet Take 1 tablet (40 mg total) by mouth daily.   sodium chloride  (OCEAN) 0.65 % SOLN nasal spray Place 1 spray into both nostrils as needed for congestion.   Zinc  50 MG TABS Take 50 mg by mouth daily.   No facility-administered encounter medications on file as of 07/25/2024.    Review of Systems  Constitutional:  Negative for activity change, appetite change, fatigue and unexpected weight change.  HENT:   Negative for congestion and hearing loss.   Eyes: Negative.   Respiratory:  Negative for cough and shortness of breath.   Cardiovascular:  Negative for chest pain, palpitations and leg swelling.  Gastrointestinal:  Negative for abdominal pain, constipation and diarrhea.  Genitourinary:  Negative for difficulty urinating and dysuria.  Musculoskeletal:  Negative for arthralgias and myalgias.  Skin:  Negative for color change and wound.  Neurological:  Negative for dizziness and weakness.  Psychiatric/Behavioral:  Negative for agitation, behavioral problems and confusion.      Immunization History  Administered Date(s) Administered   PPD Test 05/14/2021   RSV,unspecified 06/26/2024  Tdap 01/24/2012   Pertinent  Health Maintenance Due  Topic Date Due   DEXA SCAN  Never done   Influenza Vaccine  Never done      04/26/2021   10:00 PM 04/27/2021    7:35 AM 04/27/2021    8:40 PM 04/28/2021    7:34 AM 05/04/2021   11:08 AM  Fall Risk  (RETIRED) Patient Fall Risk Level High fall risk  High fall risk  High fall risk  High fall risk  High fall risk      Data saved with a previous flowsheet row definition   Functional Status Survey:    Vitals:   07/25/24 0932 07/25/24 1106  BP: (!) 140/76 136/67  Pulse: 81   Resp: 18   Temp: 97.9 F (36.6 C)   SpO2: 96%   Weight: 163 lb (73.9 kg)   Height: 5' 10 (1.778 m)    Body mass index is 23.39 kg/m. Physical Exam Constitutional:      General: She is not in acute distress.    Appearance: She is well-developed. She is not diaphoretic.  HENT:     Head: Normocephalic and atraumatic.     Mouth/Throat:     Pharynx: No oropharyngeal exudate.  Eyes:     Conjunctiva/sclera: Conjunctivae normal.     Pupils: Pupils are equal, round, and reactive to light.  Cardiovascular:     Rate and Rhythm: Normal rate and regular rhythm.     Heart sounds: Normal heart sounds.  Pulmonary:     Effort: Pulmonary effort is normal.     Breath sounds:  Normal breath sounds.  Abdominal:     General: Bowel sounds are normal.     Palpations: Abdomen is soft.  Musculoskeletal:     Cervical back: Normal range of motion and neck supple.     Right lower leg: No edema.     Left lower leg: No edema.  Skin:    General: Skin is warm and dry.  Neurological:     Mental Status: She is alert.  Psychiatric:        Mood and Affect: Mood normal.     Labs reviewed: Recent Labs    06/12/24 1136 06/14/24 0358 06/18/24 1107 06/24/24 0000  NA 139 140 139 139  K 4.0 4.1 3.9 4.6  CL 104 105 107 104  CO2 27 26 24  30*  GLUCOSE 94 134* 130*  --   BUN 25* 37* 33* 27*  CREATININE 1.06* 1.22* 1.04* 1.0  CALCIUM  9.2 9.4 8.9 8.4*   Recent Labs    06/12/24 1136  AST 20  ALT 15  ALKPHOS 63  BILITOT 1.0  PROT 7.7  ALBUMIN 3.7   Recent Labs    06/12/24 1136 06/14/24 0358 06/18/24 1107 06/24/24 0000  WBC 9.3 11.3* 10.6* 11.5  NEUTROABS 5.5  --   --  6,785.00  HGB 11.9* 13.3 12.1 9.0*  HCT 37.0 41.6 37.6 27*  MCV 96.6 96.5 95.9  --   PLT 265 303 290 330   No results found for: TSH Lab Results  Component Value Date   HGBA1C 5.8 (H) 06/12/2024   Lab Results  Component Value Date   CHOL 208 (H) 06/13/2024   HDL 77 06/13/2024   LDLCALC 109 (H) 06/13/2024   TRIG 109 06/13/2024   CHOLHDL 2.7 06/13/2024    Significant Diagnostic Results in last 30 days:  No results found.  Assessment/Plan Age-related osteoporosis with current pathological fracture with routine healing Continues on cal  and vitd with weight bearing exercises.   Arthritis No current pain Has tylenol  scheduled.   Essential hypertension, benign Blood pressure well controlled, goal bp <140/90 Continue current medications and dietary modifications follow metabolic panel  Hyperlipidemia Currently on crestor  40 mg daily.  Follow lipids  Late effect of cerebrovascular accident (CVA) Stable at this time, continues to work with PT  Continues on plavix , asa and  statin for medical management   Normocytic anemia Will follow up cbc with next lab day No signs of bleeding but worsening hgb on last lab.   Prediabetes Continue dietary modifications and monitor a1c  Recurrent falls High fall risk Fall precautions in place at facility Will be long term care.      Iyahna Obriant K. Caro BODILY [facility] [phone]

## 2024-07-28 DIAGNOSIS — E782 Mixed hyperlipidemia: Secondary | ICD-10-CM | POA: Insufficient documentation

## 2024-07-28 DIAGNOSIS — R296 Repeated falls: Secondary | ICD-10-CM

## 2024-07-28 DIAGNOSIS — E785 Hyperlipidemia, unspecified: Secondary | ICD-10-CM | POA: Insufficient documentation

## 2024-07-28 DIAGNOSIS — I6930 Unspecified sequelae of cerebral infarction: Secondary | ICD-10-CM | POA: Insufficient documentation

## 2024-07-28 DIAGNOSIS — M1990 Unspecified osteoarthritis, unspecified site: Secondary | ICD-10-CM | POA: Insufficient documentation

## 2024-07-28 HISTORY — DX: Repeated falls: R29.6

## 2024-07-28 NOTE — Assessment & Plan Note (Signed)
 Continues on cal and vitd with weight bearing exercises.

## 2024-07-28 NOTE — Assessment & Plan Note (Signed)
 High fall risk Fall precautions in place at facility Will be long term care.

## 2024-07-28 NOTE — Assessment & Plan Note (Signed)
 No current pain Has tylenol  scheduled.

## 2024-07-28 NOTE — Assessment & Plan Note (Signed)
 Will follow up cbc with next lab day No signs of bleeding but worsening hgb on last lab.

## 2024-07-28 NOTE — Assessment & Plan Note (Signed)
 Blood pressure well controlled, goal bp <140/90 Continue current medications and dietary modifications follow metabolic panel

## 2024-07-28 NOTE — Assessment & Plan Note (Signed)
 Continue dietary modifications and monitor a1c

## 2024-07-28 NOTE — Assessment & Plan Note (Signed)
 Currently on crestor  40 mg daily.  Follow lipids

## 2024-07-28 NOTE — Assessment & Plan Note (Signed)
 Stable at this time, continues to work with PT  Continues on plavix , asa and statin for medical management

## 2024-07-29 LAB — CBC: RBC: 3.13 — AB (ref 3.87–5.11)

## 2024-07-29 LAB — CBC AND DIFFERENTIAL
HCT: 30 — AB (ref 36–46)
Hemoglobin: 9.7 — AB (ref 12.0–16.0)
Neutrophils Absolute: 4889
Platelets: 344 K/uL (ref 150–400)
WBC: 9.7

## 2024-08-12 LAB — BASIC METABOLIC PANEL WITH GFR
BUN: 27 — AB (ref 4–21)
CO2: 29 — AB (ref 13–22)
Chloride: 105 (ref 99–108)
Creatinine: 1.2 — AB (ref 0.5–1.1)
Glucose: 98
Potassium: 4.5 meq/L (ref 3.5–5.1)
Sodium: 138 (ref 137–147)

## 2024-08-12 LAB — COMPREHENSIVE METABOLIC PANEL WITH GFR
Calcium: 8.8 (ref 8.7–10.7)
eGFR: 45

## 2024-08-19 ENCOUNTER — Encounter: Payer: Self-pay | Admitting: Adult Health

## 2024-08-19 ENCOUNTER — Non-Acute Institutional Stay (SKILLED_NURSING_FACILITY): Payer: Self-pay | Admitting: Adult Health

## 2024-08-19 DIAGNOSIS — Z8673 Personal history of transient ischemic attack (TIA), and cerebral infarction without residual deficits: Secondary | ICD-10-CM | POA: Diagnosis not present

## 2024-08-19 DIAGNOSIS — B372 Candidiasis of skin and nail: Secondary | ICD-10-CM

## 2024-08-19 DIAGNOSIS — I10 Essential (primary) hypertension: Secondary | ICD-10-CM | POA: Diagnosis not present

## 2024-08-19 NOTE — Progress Notes (Unsigned)
 Location:  Other [facility]) Nursing Home Room Number: [place] [facility]) Place of Service:  SNF [phone]) Provider:  Medina-Vargas, Truth Barot, DNP, FNP-BC  Patient Care Team: [name], MD as PCP - General (Family Medicine) Dessa Reyes ORN, MD (General Surgery)  Extended Emergency Contact Information Primary Emergency Contact: Jasper,Teresa Mobile Phone: [phone] Relation: Niece Secondary Emergency Contact: Jones,[name] Mobile Phone: [phone] Relation: Niece  Code Status:  DNR  Goals of care: Advanced Directive information    08/19/2024   10:29 AM  Advanced Directives  Does Patient Have a Medical Advance Directive? Yes  Type of Advance Directive Out of facility DNR (pink MOST or yellow form)  Does patient want to make changes to medical advance directive? No - Patient declined  Pre-existing out of facility DNR order (yellow form or pink MOST form) Pink MOST form placed in chart (order not valid for inpatient use)     Chief Complaint  Patient presents with   Rash     rash and itching    HPI:  Pt is a 88 y.o. female seen today for medical management of chronic diseases.  ***   Past Medical History:  Diagnosis Date   Actinic keratosis    Arthritis    Breast cancer (HCC) [phone]   Cancer [facility])    breast s/p bilateral lumpectomies with radiation,   Complication of anesthesia    Pt stated her top lip was puffed up and felt strange for weeks after having hip surgery. Pt thinks intubation appliance may have caused it   Constipation due to pain medication    Environmental and seasonal allergies    Frequency of urination    Hx of basal cell carcinoma 08/19/2010   Mid dorsum nose supra tip   Hypertension    pt has not had any blood pressure medications in over 4 years   Malignant neoplasm of upper-outer quadrant of female breast (HCC) 1995   [place]  wide excision, reexcision, whole breast radiation.   Malignant neoplasm of  upper-outer quadrant of female breast (HCC) 11/2010   Left:Invasive ductal carcinoma ER 90%, PR 90%, HER-2/neu not over dressing. T1a   Malignant neoplasm of upper-outer quadrant of female breast (HCC) 2002   Right breast   Squamous cell carcinoma of skin 11/23/2007   Right lateral upper eyelid. SCCis   Squamous cell carcinoma of skin 09/03/2019   Right lateral pretibia. EDC.    Squamous cell carcinoma of skin 03/30/2020   R lateral pretibia    Squamous cell carcinoma of skin 03/09/2022   R upper arm, EDC   Trigger finger of both hands    Past Surgical History:  Procedure Laterality Date   BACK SURGERY  1960s   lumbar ruptured discs   BREAST BIOPSY     BREAST LUMPECTOMY Left 1995 with Radiation   BREAST LUMPECTOMY Right 2002   with Radiation   BREAST LUMPECTOMY Left 2011   BREAST SURGERY Bilateral    lumpectomies x 3   COLONOSCOPY  2006   Dr. Viktoria   EYE SURGERY  1990s   bilat cataract removal   FRACTURE SURGERY     L wrist repair, R lower leg   JOINT REPLACEMENT     R knee w/prior arhtroscopy   KYPHOPLASTY N/A 10/23/2014   Procedure: KYPHOPLASTY;  Surgeon: Oneil Rodgers Priestly, MD;  Location: [facility] OR;  Service: Orthopedics;  Laterality: N/A;  T 12 kyphoplasty   TOTAL HIP ARTHROPLASTY  07/30/2012   Procedure: TOTAL HIP ARTHROPLASTY;  Surgeon: Dempsey JINNY Sensor, MD;  Location: [facility] OR;  Service: Orthopedics;  Laterality: Left;   uterine tumor excision     benign   WRIST SURGERY Left     Allergies  Allergen Reactions   Ativan  [Lorazepam ]    Demerol [Meperidine] Nausea And Vomiting   Oxycodone     Propoxyphene Nausea And Vomiting   Tape Itching    Outpatient Encounter Medications as of 08/19/2024  Medication Sig   acetaminophen  (TYLENOL ) 325 MG tablet Take 2 tablets (650 mg total) by mouth every 4 (four) hours as needed for mild pain (pain score 1-3) or fever (or temp > 37.5 C (99.5 F)).   acetaminophen  (TYLENOL ) 325 MG tablet Take 650 mg by mouth 3 (three) times daily.    aspirin  EC 81 MG tablet Take 1 tablet (81 mg total) by mouth daily. Swallow whole.   bisacodyl  (DULCOLAX) 10 MG suppository Place 1 suppository (10 mg total) rectally daily as needed for moderate constipation.   Calcium -Magnesium -Vitamin D  (CALCIUM  MAGNESIUM  PO) Take 1 tablet by mouth daily.   cholecalciferol  (VITAMIN D ) 1000 UNITS tablet Take 1,000 Units by mouth daily.   clopidogrel  (PLAVIX ) 75 MG tablet Take 1 tablet (75 mg total) by mouth daily.   diclofenac Sodium (VOLTAREN) 1 % GEL Apply 4 g topically every 6 (six) hours as needed.   feeding supplement (ENSURE PLUS HIGH PROTEIN) LIQD Take 237 mLs by mouth 2 (two) times daily between meals.   loratadine  (CLARITIN ) 10 MG tablet Take 10 mg by mouth daily as needed for allergies.   losartan (COZAAR) 25 MG tablet Take 25 mg by mouth daily.   melatonin 5 MG TABS Take 5 mg by mouth at bedtime.   mirtazapine (REMERON) 7.5 MG tablet Take 15 mg by mouth at bedtime.   Polyvinyl Alcohol -Povidone (REFRESH OP) Place 1 drop into both eyes as needed (dry eyes).    rosuvastatin  (CRESTOR ) 40 MG tablet Take 1 tablet (40 mg total) by mouth daily.   sodium chloride  (OCEAN) 0.65 % SOLN nasal spray Place 1 spray into both nostrils as needed for congestion.   Zinc  50 MG TABS Take 50 mg by mouth daily.   No facility-administered encounter medications on file as of 08/19/2024.    Review of Systems ***    Immunization History  Administered Date(s) Administered   PPD Test 05/14/2021   RSV,unspecified 06/26/2024   Tdap 01/24/2012   Pertinent  Health Maintenance Due  Topic Date Due   DEXA SCAN  Never done   Mammogram  05/04/2024   Influenza Vaccine  Never done      04/26/2021   10:00 PM 04/27/2021    7:35 AM 04/27/2021    8:40 PM 04/28/2021    7:34 AM 05/04/2021   11:08 AM  Fall Risk  (RETIRED) Patient Fall Risk Level High fall risk  High fall risk  High fall risk  High fall risk  High fall risk      Data saved with a previous flowsheet row  definition     Vitals:   08/19/24 1032  BP: 126/61  Pulse: 74  Resp: 16  Temp: 97.6 F (36.4 C)  SpO2: 95%  Weight: 166 lb 6.4 oz (75.5 kg)  Height: 5' 10 (1.778 m)   Body mass index is 23.88 kg/m.  Physical Exam     Labs reviewed: Recent Labs    06/12/24 1136 06/14/24 0358 06/18/24 1107 06/24/24 0000  NA 139 140 139 139  K 4.0 4.1 3.9 4.6  CL  104 105 107 104  CO2 27 26 24  30*  GLUCOSE 94 134* 130*  --   BUN 25* 37* 33* 27*  CREATININE 1.06* 1.22* 1.04* 1.0  CALCIUM  9.2 9.4 8.9 8.4*   Recent Labs    06/12/24 1136  AST 20  ALT 15  ALKPHOS 63  BILITOT 1.0  PROT 7.7  ALBUMIN 3.7   Recent Labs    06/12/24 1136 06/14/24 0358 06/18/24 1107 06/24/24 0000  WBC 9.3 11.3* 10.6* 11.5  NEUTROABS 5.5  --   --  6,785.00  HGB 11.9* 13.3 12.1 9.0*  HCT 37.0 41.6 37.6 27*  MCV 96.6 96.5 95.9  --   PLT 265 303 290 330   No results found for: TSH Lab Results  Component Value Date   HGBA1C 5.8 (H) 06/12/2024   Lab Results  Component Value Date   CHOL 208 (H) 06/13/2024   HDL 77 06/13/2024   LDLCALC 109 (H) 06/13/2024   TRIG 109 06/13/2024   CHOLHDL 2.7 06/13/2024    Significant Diagnostic Results in last 30 days:  No results found.  Assessment/Plan ***   Family/ staff Communication: Discussed plan of care with resident and charge nurse  Labs/tests ordered:     Blossom Crume Medina-Vargas, DNP, MSN, FNP-BC [facility] [phone] (Monday-Friday 8:00 a.m. - 5:00 p.m.) [phone] (after hours)

## 2024-08-23 ENCOUNTER — Encounter: Payer: Self-pay | Admitting: Orthopedic Surgery

## 2024-08-23 ENCOUNTER — Non-Acute Institutional Stay (SKILLED_NURSING_FACILITY): Admitting: Orthopedic Surgery

## 2024-08-23 DIAGNOSIS — I10 Essential (primary) hypertension: Secondary | ICD-10-CM | POA: Diagnosis not present

## 2024-08-23 DIAGNOSIS — E785 Hyperlipidemia, unspecified: Secondary | ICD-10-CM

## 2024-08-23 DIAGNOSIS — M8000XD Age-related osteoporosis with current pathological fracture, unspecified site, subsequent encounter for fracture with routine healing: Secondary | ICD-10-CM

## 2024-08-23 DIAGNOSIS — I639 Cerebral infarction, unspecified: Secondary | ICD-10-CM

## 2024-08-23 DIAGNOSIS — D649 Anemia, unspecified: Secondary | ICD-10-CM

## 2024-08-23 DIAGNOSIS — R7303 Prediabetes: Secondary | ICD-10-CM | POA: Diagnosis not present

## 2024-08-23 DIAGNOSIS — G4700 Insomnia, unspecified: Secondary | ICD-10-CM

## 2024-08-23 DIAGNOSIS — R296 Repeated falls: Secondary | ICD-10-CM

## 2024-08-23 NOTE — Progress Notes (Unsigned)
 Location:  Other [facility]) Nursing Home Room Number: [place] [address] Place of Service:  SNF [phone]) Provider: Greig Cluster, NP   Patient Care Team: [name], MD as PCP - General (Family Medicine) Lori Wallace ORN, MD (General Surgery)  Extended Emergency Contact Information Primary Emergency Contact: Jasper,Teresa Mobile Phone: [phone] Relation: Niece Secondary Emergency Contact: Jones,[name] Mobile Phone: [phone] Relation: Niece  Code Status:  DNR Goals of care: Advanced Directive information    08/19/2024   10:29 AM  Advanced Directives  Does Patient Have a Medical Advance Directive? Yes  Type of Advance Directive Out of facility DNR (pink MOST or yellow form)  Does patient want to make changes to medical advance directive? No - Patient declined  Pre-existing out of facility DNR order (yellow form or pink MOST form) Pink MOST form placed in chart (order not valid for inpatient use)     Chief Complaint  Patient presents with   Routine Visit    HPI:  Pt is a 88 y.o. female seen today for medical management of chronic diseases.    She currently resides on the skilled nursing unit at [facility]. 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   HTN- BUN/creat 27/1.2 08/12/2024, remains on losartan HLD- Total 208, LDL 109, remains on rosuvastatin  CVA- hospitalized 08/06-08/14> CT head noted no acute intracranial abnormality> chronic small vessel disease noted, MRI brain acute infarcts noted to right frontal lobe involving corona radiata and periventricular white matter, endarterectomy recommended> family declined, remains on asa, Plavix  and statin  Prediabetes- A1c 5.8 06/12/2024, not on medication Recurrent falls- 08/16 fall with laceration to head Osteoporosis- no  recent DEXA, h/o radiation, remains on calcium  and vitamin D  Anemia- hgb 10.6 06/18/2024, no recent bleeding  Insomnia- remains on mirtazapine   Nursing reports lack of motivation, not leaving room, refusing to have staff in room at times. She is on mirtazapine for insomnia. Family declined starting another antidepressant.   Recent weights:  10/15- 169.8 lbs  10/01- 166.4 lbs  09/03- 161.2 lbs  08/14- 154.5 lbs  Recent blood pressures:   10/17- 116/60, 123/73  10/16- 124/68   Past Medical History:  Diagnosis Date   Actinic keratosis    Arthritis    Breast cancer (HCC) [phone]   Cancer (HCC)    breast s/p bilateral lumpectomies with radiation,   Complication of anesthesia    Pt stated her top lip was puffed up and felt strange for weeks after having hip surgery. Pt thinks intubation appliance may have caused it   Constipation due to pain medication    Environmental and seasonal allergies    Frequency of urination    Hx of basal cell carcinoma 08/19/2010   Mid dorsum nose supra tip   Hypertension    pt has not had any blood pressure medications in over 4 years   Malignant neoplasm of upper-outer quadrant of female breast (HCC) 1995   [place]  wide excision, reexcision, whole breast radiation.   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 T1a   Malignant neoplasm of upper-outer quadrant of female breast (HCC) 2002   Right breast   Squamous cell carcinoma of skin 11/23/2007   Right lateral upper eyelid. SCCis   Squamous cell carcinoma of skin  09/03/2019   Right lateral pretibia. EDC.    Squamous cell carcinoma of skin 03/30/2020   R lateral pretibia    Squamous cell carcinoma of skin 03/09/2022   R upper arm, EDC   Trigger finger of both hands    Past Surgical History:  Procedure Laterality Date   BACK SURGERY  1960s   lumbar ruptured discs   BREAST  BIOPSY     BREAST LUMPECTOMY Left 1995 with Radiation   BREAST LUMPECTOMY Right 2002   with Radiation   BREAST LUMPECTOMY Left 2011   BREAST SURGERY Bilateral    lumpectomies x 3   COLONOSCOPY  2006   Dr. Viktoria   EYE SURGERY  1990s   bilat cataract removal   FRACTURE SURGERY     L wrist repair, R lower leg   JOINT REPLACEMENT     R knee w/prior arhtroscopy   KYPHOPLASTY N/A 10/23/2014   Procedure: KYPHOPLASTY;  Surgeon: Oneil Rodgers Priestly, MD;  Location: [facility] OR;  Service: Orthopedics;  Laterality: N/A;  T 12 kyphoplasty   TOTAL HIP ARTHROPLASTY  07/30/2012   Procedure: TOTAL HIP ARTHROPLASTY;  Surgeon: Dempsey JINNY Sensor, MD;  Location: [facility] OR;  Service: Orthopedics;  Laterality: Left;   uterine tumor excision     benign   WRIST SURGERY Left     Allergies  Allergen Reactions   Ativan  [Lorazepam ]    Demerol [Meperidine] Nausea And Vomiting   Oxycodone     Propoxyphene Nausea And Vomiting   Tape Itching    Outpatient Encounter Medications as of 08/23/2024  Medication Sig   acetaminophen  (TYLENOL ) 325 MG tablet Take 2 tablets (650 mg total) by mouth every 4 (four) hours as needed for mild pain (pain score 1-3) or fever (or temp > 37.5 C (99.5 F)).   acetaminophen  (TYLENOL ) 325 MG tablet Take 650 mg by mouth 3 (three) times daily.   aspirin  EC 81 MG tablet Take 1 tablet (81 mg total) by mouth daily. Swallow whole.   bisacodyl  (DULCOLAX) 10 MG suppository Place 1 suppository (10 mg total) rectally daily as needed for moderate constipation.   Calcium -Magnesium -Vitamin D  (CALCIUM  MAGNESIUM  PO) Take 1 tablet by mouth daily.   cholecalciferol  (VITAMIN D ) 1000 UNITS tablet Take 1,000 Units by mouth daily.   clopidogrel  (PLAVIX ) 75 MG tablet Take 1 tablet (75 mg total) by mouth daily.   diclofenac Sodium (VOLTAREN) 1 % GEL Apply 4 g topically every 6 (six) hours as needed.   feeding supplement (ENSURE PLUS HIGH PROTEIN) LIQD Take 237 mLs by mouth 2 (two) times daily between meals.    Hexylresorcinol (THROAT LOZENGES MT) Use as directed 1 drop in the mouth or throat.   loratadine  (CLARITIN ) 10 MG tablet Take 10 mg by mouth daily as needed for allergies.   losartan (COZAAR) 25 MG tablet Take 25 mg by mouth daily.   melatonin 5 MG TABS Take 5 mg by mouth at bedtime.   mirtazapine (REMERON) 7.5 MG tablet Take 15 mg by mouth at bedtime.   nystatin cream (MYCOSTATIN) Apply 1 Application topically 3 (three) times daily.  Apply to under both breast topically three times a day for Rash   Polyvinyl Alcohol -Povidone (REFRESH OP) Place 1 drop into both eyes as needed (dry eyes).    rosuvastatin  (CRESTOR ) 40 MG tablet Take 1 tablet (40 mg total) by mouth daily.   sodium chloride  (OCEAN) 0.65 % SOLN nasal spray Place 1 spray into both nostrils as needed for congestion.   Zinc  50 MG  TABS Take 50 mg by mouth daily.   No facility-administered encounter medications on file as of 08/23/2024.    Review of Systems  Constitutional:  Negative for fatigue and fever.  HENT:  Negative for congestion and sore throat.   Eyes:  Negative for visual disturbance.  Respiratory:  Negative for cough and shortness of breath.   Cardiovascular:  Negative for chest pain and leg swelling.  Gastrointestinal:  Positive for constipation. Negative for abdominal distention and abdominal pain.  Genitourinary:  Negative for dysuria and hematuria.  Musculoskeletal:  Positive for arthralgias and gait problem.  Skin:  Negative for wound.  Neurological:  Positive for weakness. Negative for dizziness and headaches.  Psychiatric/Behavioral:  Positive for dysphoric mood and sleep disturbance. The patient is not nervous/anxious.     Immunization History  Administered Date(s) Administered   PPD Test 05/14/2021   RSV,unspecified 06/26/2024   Tdap 01/24/2012   Pertinent  Health Maintenance Due  Topic Date Due   DEXA SCAN  Never done   Influenza Vaccine  Never done   Mammogram  Discontinued      04/26/2021    10:00 PM 04/27/2021    7:35 AM 04/27/2021    8:40 PM 04/28/2021    7:34 AM 05/04/2021   11:08 AM  Fall Risk  (RETIRED) Patient Fall Risk Level High fall risk  High fall risk  High fall risk  High fall risk  High fall risk      Data saved with a previous flowsheet row definition   Functional Status Survey:    Vitals:   08/23/24 1228  BP: 124/68  Pulse: 82  Resp: 18  Temp: (!) 97.5 F (36.4 C)  SpO2: 95%  Weight: 169 lb 12.8 oz (77 kg)  Height: 5' 10 (1.778 m)   Body mass index is 24.36 kg/m. Physical Exam Vitals reviewed.  Constitutional:      General: She is not in acute distress. HENT:     Head: Normocephalic.  Eyes:     General:        Right eye: No discharge.        Left eye: No discharge.  Cardiovascular:     Rate and Rhythm: Normal rate and regular rhythm.     Pulses: Normal pulses.     Heart sounds: Normal heart sounds.  Pulmonary:     Effort: Pulmonary effort is normal.     Breath sounds: Normal breath sounds.  Abdominal:     General: Bowel sounds are normal. There is no distension.     Palpations: Abdomen is soft.     Tenderness: There is no abdominal tenderness.  Musculoskeletal:     Cervical back: Neck supple.     Right lower leg: No edema.     Left lower leg: No edema.  Skin:    General: Skin is warm.     Capillary Refill: Capillary refill takes less than 2 seconds.     Comments: Left shin skin tear, CDI, no infection  Neurological:     General: No focal deficit present.     Mental Status: She is alert. Mental status is at baseline.     Gait: Gait abnormal.  Psychiatric:        Mood and Affect: Mood normal.     Labs reviewed: Recent Labs    06/12/24 1136 06/14/24 0358 06/18/24 1107 06/24/24 0000 08/12/24 0000  NA 139 140 139 139 138  K 4.0 4.1 3.9 4.6 4.5  CL 104 105 107 104  105  CO2 27 26 24  30* 29*  GLUCOSE 94 134* 130*  --   --   BUN 25* 37* 33* 27* 27*  CREATININE 1.06* 1.22* 1.04* 1.0 1.2*  CALCIUM  9.2 9.4 8.9 8.4* 8.8    Recent Labs    06/12/24 1136  AST 20  ALT 15  ALKPHOS 63  BILITOT 1.0  PROT 7.7  ALBUMIN 3.7   Recent Labs    06/12/24 1136 06/14/24 0358 06/18/24 1107 06/24/24 0000 07/29/24 0000  WBC 9.3 11.3* 10.6* 11.5 9.7  NEUTROABS 5.5  --   --  6,785.00 4,889.00  HGB 11.9* 13.3 12.1 9.0* 9.7*  HCT 37.0 41.6 37.6 27* 30*  MCV 96.6 96.5 95.9  --   --   PLT 265 303 290 330 344   No results found for: TSH Lab Results  Component Value Date   HGBA1C 5.8 (H) 06/12/2024   Lab Results  Component Value Date   CHOL 208 (H) 06/13/2024   HDL 77 06/13/2024   LDLCALC 109 (H) 06/13/2024   TRIG 109 06/13/2024   CHOLHDL 2.7 06/13/2024    Significant Diagnostic Results in last 30 days:  No results found.  Assessment/Plan 1. Essential hypertension, benign (Primary) - controlled with losartan  2. Hyperlipidemia, unspecified hyperlipidemia type - cont rosuvastatin   3. Cerebrovascular accident (CVA), unspecified mechanism (HCC) - hospitalized 08/06-08/14 - cont asa, plavix , statin  4. Prediabetes - A1c 5.8 06/12/2024 - diet controlled  5. Recurrent falls - 08/16 injury to face/head - cont PT/OT  6. Age-related osteoporosis with current pathological fracture with routine healing - no recent DEXA - h/o radiation treatments - cont calcium /vitamin D   7. Normocytic anemia - hgb 10.6 - no sign of bleeding - bp stable  8. Insomnia, unspecified type - cont mirtazapine     Family/ staff Communication: plan discussed with patient and nurse  Labs/tests ordered:  none

## 2024-09-18 ENCOUNTER — Non-Acute Institutional Stay (SKILLED_NURSING_FACILITY): Admitting: Orthopedic Surgery

## 2024-09-18 ENCOUNTER — Encounter: Payer: Self-pay | Admitting: Orthopedic Surgery

## 2024-09-18 DIAGNOSIS — I10 Essential (primary) hypertension: Secondary | ICD-10-CM

## 2024-09-18 DIAGNOSIS — M8000XD Age-related osteoporosis with current pathological fracture, unspecified site, subsequent encounter for fracture with routine healing: Secondary | ICD-10-CM

## 2024-09-18 DIAGNOSIS — Z8673 Personal history of transient ischemic attack (TIA), and cerebral infarction without residual deficits: Secondary | ICD-10-CM

## 2024-09-18 DIAGNOSIS — M25551 Pain in right hip: Secondary | ICD-10-CM | POA: Diagnosis not present

## 2024-09-18 DIAGNOSIS — R7303 Prediabetes: Secondary | ICD-10-CM

## 2024-09-18 DIAGNOSIS — D649 Anemia, unspecified: Secondary | ICD-10-CM

## 2024-09-18 DIAGNOSIS — E782 Mixed hyperlipidemia: Secondary | ICD-10-CM

## 2024-09-18 DIAGNOSIS — M25552 Pain in left hip: Secondary | ICD-10-CM

## 2024-09-18 DIAGNOSIS — F5101 Primary insomnia: Secondary | ICD-10-CM

## 2024-09-18 DIAGNOSIS — R635 Abnormal weight gain: Secondary | ICD-10-CM

## 2024-09-18 NOTE — Progress Notes (Signed)
 Location:  Other Nursing Home Room Number: [address] Place of Service:  SNF (31) Provider:  Greig Cluster, NP  PCP: [name], MD  Patient Care Team: [name], MD as PCP - General (Family Medicine) Lori Wallace ORN, MD (General Surgery)  Extended Emergency Contact Information Primary Emergency Contact: Lori Wallace Mobile Phone: [phone] Relation: Niece Secondary Emergency Contact: Jones,[name] Mobile Phone: [phone] Relation: Niece  Code Status:  DNR Goals of care: Advanced Directive information    08/19/2024   10:29 AM  Advanced Directives  Does Patient Have a Medical Advance Directive? Yes  Type of Advance Directive Out of facility DNR (pink MOST or yellow form)  Does patient want to make changes to medical advance directive? No - Patient declined  Pre-existing out of facility DNR order (yellow form or pink MOST form) Pink MOST form placed in chart (order not valid for inpatient use)     Chief Complaint  Patient presents with   Medical Management of Chronic Issues    Medical Management of Chronic Issues    HPI:  Pt is a 88 y.o. female seen today for medical management of chronic diseases.    She currently resides on the skilled nursing unit at [facility]. 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   Bilateral hip pain-  L>R, h/o left hip arthroplasty and pelvic fractures, working with PT, remains on tylenol  prn HTN- BUN/creat 27/1.2 08/12/2024, remains on losartan HLD- Total 208, LDL 109, remains on rosuvastatin  CVA- hospitalized 08/06-08/14> CT head noted no acute intracranial abnormality> chronic small vessel disease noted, MRI brain acute infarcts noted to right frontal lobe involving corona radiata and periventricular white matter,  endarterectomy recommended> family declined, remains on asa, Plavix  and statin  Prediabetes- A1c 5.8 06/12/2024, not on medication Osteoporosis- no recent DEXA, h/o radiation, remains on calcium  and vitamin D  Anemia- hgb 10.6 06/18/2024, no recent bleeding  Insomnia- remains on mirtazapine   Dry skin with 2 skin tears to RLE. Using aquaphor without success.   Continues to have lack of motivation. Does not leave room often or will refuse staff in room. Zoloft declined by family.   Recent weights:  11/05- 170.6 lbs  10/01- 166.4 lbs  09/03- 161.2 lbs  Recent blood pressures:  11/12- 152/76, 97/56, 118/61   Past Medical History:  Diagnosis Date   Actinic keratosis    Arthritis    Breast cancer (HCC) [phone]   Cancer (HCC)    breast s/p bilateral lumpectomies with radiation,   Complication of anesthesia    Pt stated her top lip was puffed up and felt strange for weeks after having hip surgery. Pt thinks intubation appliance may have caused it   Constipation due to pain medication    Environmental and seasonal allergies    Frequency of urination    Hx of basal cell carcinoma 08/19/2010   Mid dorsum nose supra tip   Hypertension    pt has not had any blood pressure medications in over 4 years   Malignant neoplasm of upper-outer quadrant of female breast (HCC) 1995   [place]  wide excision, reexcision, whole breast radiation.   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 T1a   Malignant neoplasm of upper-outer quadrant of female breast (HCC) 2002  Right breast   Squamous cell carcinoma of skin 11/23/2007   Right lateral upper eyelid. SCCis   Squamous cell carcinoma of skin 09/03/2019   Right lateral pretibia. EDC.    Squamous cell carcinoma of skin 03/30/2020   R lateral pretibia    Squamous cell carcinoma of skin 03/09/2022   R upper arm, EDC   Trigger  finger of both hands    Past Surgical History:  Procedure Laterality Date   BACK SURGERY  1960s   lumbar ruptured discs   BREAST BIOPSY     BREAST LUMPECTOMY Left 1995 with Radiation   BREAST LUMPECTOMY Right 2002   with Radiation   BREAST LUMPECTOMY Left 2011   BREAST SURGERY Bilateral    lumpectomies x 3   COLONOSCOPY  2006   Dr. Viktoria   EYE SURGERY  1990s   bilat cataract removal   FRACTURE SURGERY     L wrist repair, R lower leg   JOINT REPLACEMENT     R knee w/prior arhtroscopy   KYPHOPLASTY N/A 10/23/2014   Procedure: KYPHOPLASTY;  Surgeon: Oneil Rodgers Priestly, MD;  Location: [facility] OR;  Service: Orthopedics;  Laterality: N/A;  T 12 kyphoplasty   TOTAL HIP ARTHROPLASTY  07/30/2012   Procedure: TOTAL HIP ARTHROPLASTY;  Surgeon: Dempsey JINNY Sensor, MD;  Location: [facility] OR;  Service: Orthopedics;  Laterality: Left;   uterine tumor excision     benign   WRIST SURGERY Left     Allergies  Allergen Reactions   Ativan  [Lorazepam ]    Demerol [Meperidine] Nausea And Vomiting   Oxycodone     Propoxyphene Nausea And Vomiting   Tape Itching    Outpatient Encounter Medications as of 09/18/2024  Medication Sig   acetaminophen  (TYLENOL ) 325 MG tablet Take 2 tablets (650 mg total) by mouth every 4 (four) hours as needed for mild pain (pain score 1-3) or fever (or temp > 37.5 C (99.5 F)).   acetaminophen  (TYLENOL ) 325 MG tablet Take 650 mg by mouth 3 (three) times daily.   aspirin  EC 81 MG tablet Take 1 tablet (81 mg total) by mouth daily. Swallow whole.   bisacodyl  (DULCOLAX) 10 MG suppository Place 1 suppository (10 mg total) rectally daily as needed for moderate constipation.   Calcium -Magnesium -Vitamin D  (CALCIUM  MAGNESIUM  PO) Take 1 tablet by mouth daily.   cholecalciferol  (VITAMIN D ) 1000 UNITS tablet Take 1,000 Units by mouth daily.   clopidogrel  (PLAVIX ) 75 MG tablet Take 1 tablet (75 mg total) by mouth daily.   diclofenac Sodium (VOLTAREN) 1 % GEL Apply 4 g topically every 6 (six)  hours as needed.   feeding supplement (ENSURE PLUS HIGH PROTEIN) LIQD Take 237 mLs by mouth 2 (two) times daily between meals.   Hexylresorcinol (THROAT LOZENGES MT) Use as directed 1 drop in the mouth or throat.   loratadine  (CLARITIN ) 10 MG tablet Take 10 mg by mouth daily as needed for allergies.   losartan (COZAAR) 25 MG tablet Take 25 mg by mouth daily.   melatonin 5 MG TABS Take 5 mg by mouth at bedtime.   mirtazapine (REMERON) 7.5 MG tablet Take 15 mg by mouth at bedtime. (Patient taking differently: Take 7.5 mg by mouth at bedtime.)   Polyvinyl Alcohol -Povidone (REFRESH OP) Place 1 drop into both eyes as needed (dry eyes).    rosuvastatin  (CRESTOR ) 40 MG tablet Take 1 tablet (40 mg total) by mouth daily.   sodium chloride  (OCEAN) 0.65 % SOLN nasal spray Place 1 spray into both nostrils as  needed for congestion.   Zinc  50 MG TABS Take 50 mg by mouth daily.   nystatin cream (MYCOSTATIN) Apply 1 Application topically 3 (three) times daily.  Apply to under both breast topically three times a day for Rash (Patient not taking: Reported on 09/18/2024)   No facility-administered encounter medications on file as of 09/18/2024.    Review of Systems  Constitutional: Negative.   HENT: Negative.    Respiratory: Negative.    Cardiovascular: Negative.   Gastrointestinal: Negative.   Genitourinary: Negative.   Musculoskeletal:  Positive for arthralgias and gait problem.  Skin:  Positive for wound.  Neurological:  Positive for weakness. Negative for dizziness and headaches.  Psychiatric/Behavioral:  Positive for dysphoric mood and sleep disturbance. Negative for agitation. The patient is not nervous/anxious.     Immunization History  Administered Date(s) Administered   PPD Test 05/14/2021   RSV,unspecified 06/26/2024   Tdap 01/24/2012   Pertinent  Health Maintenance Due  Topic Date Due   DEXA SCAN  Never done   Influenza Vaccine  Never done   Mammogram  Discontinued      04/27/2021     7:35 AM 04/27/2021    8:40 PM 04/28/2021    7:34 AM 05/04/2021   11:08 AM 08/25/2024    6:34 PM  Fall Risk  Falls in the past year?     1  Was there an injury with Fall?     1  Fall Risk Category Calculator     3  (RETIRED) Patient Fall Risk Level High fall risk  High fall risk  High fall risk  High fall risk    Patient at Risk for Falls Due to     History of fall(s)  Fall risk Follow up     Falls evaluation completed     Data saved with a previous flowsheet row definition   Functional Status Survey:    Vitals:   09/18/24 1103  BP: 118/61  Pulse: 75  Resp: 20  Temp: 97.6 F (36.4 C)  SpO2: 96%  Weight: 170 lb 9.6 oz (77.4 kg)  Height: 5' 10 (1.778 m)   Body mass index is 24.48 kg/m. Physical Exam Vitals reviewed.  Constitutional:      General: She is not in acute distress. HENT:     Head: Normocephalic.  Eyes:     General:        Right eye: No discharge.        Left eye: No discharge.  Cardiovascular:     Rate and Rhythm: Normal rate and regular rhythm.     Pulses: Normal pulses.     Heart sounds: Normal heart sounds.  Pulmonary:     Effort: Pulmonary effort is normal. No respiratory distress.     Breath sounds: Normal breath sounds. No wheezing or rales.  Abdominal:     General: Bowel sounds are normal. There is no distension.     Palpations: Abdomen is soft.     Tenderness: There is no abdominal tenderness.  Musculoskeletal:     Cervical back: Neck supple.     Right lower leg: No edema.     Left lower leg: No edema.  Skin:    General: Skin is warm and dry.     Capillary Refill: Capillary refill takes less than 2 seconds.     Comments: Extremities dry flaking skin, senile purpura to extremities> vary in size, 2 small skin tears to right shin, no infection   Neurological:  General: No focal deficit present.     Mental Status: She is alert and oriented to person, place, and time.     Gait: Gait abnormal.  Psychiatric:        Mood and Affect: Mood  normal.     Labs reviewed: Recent Labs    06/12/24 1136 06/14/24 0358 06/18/24 1107 06/24/24 0000 08/12/24 0000  NA 139 140 139 139 138  K 4.0 4.1 3.9 4.6 4.5  CL 104 105 107 104 105  CO2 27 26 24  30* 29*  GLUCOSE 94 134* 130*  --   --   BUN 25* 37* 33* 27* 27*  CREATININE 1.06* 1.22* 1.04* 1.0 1.2*  CALCIUM  9.2 9.4 8.9 8.4* 8.8   Recent Labs    06/12/24 1136  AST 20  ALT 15  ALKPHOS 63  BILITOT 1.0  PROT 7.7  ALBUMIN 3.7   Recent Labs    06/12/24 1136 06/14/24 0358 06/18/24 1107 06/24/24 0000 07/29/24 0000  WBC 9.3 11.3* 10.6* 11.5 9.7  NEUTROABS 5.5  --   --  6,785.00 4,889.00  HGB 11.9* 13.3 12.1 9.0* 9.7*  HCT 37.0 41.6 37.6 27* 30*  MCV 96.6 96.5 95.9  --   --   PLT 265 303 290 330 344   No results found for: TSH Lab Results  Component Value Date   HGBA1C 5.8 (H) 06/12/2024   Lab Results  Component Value Date   CHOL 208 (H) 06/13/2024   HDL 77 06/13/2024   LDLCALC 109 (H) 06/13/2024   TRIG 109 06/13/2024   CHOLHDL 2.7 06/13/2024    Significant Diagnostic Results in last 30 days:  No results found.  Assessment/Plan 1. Bilateral hip pain (Primary) - ongoing -h/o LATH and pelvic fractures - working with PT - cont tylenol    2. Essential hypertension, benign - controlled with losartan  3. Mixed hyperlipidemia - cont rosuvastatin   4. History of CVA (cerebrovascular accident) - hospitalized 08/06-08/14 - cont asa, plavix , statin  5. Prediabetes - A1c stable - diet controlled  6. Age-related osteoporosis with current pathological fracture with routine healing - no recent DEXA - h/o radiation treatments - cont calcium /vitamin D   7. Normocytic anemia - hgb stable - not on medication  8. Primary insomnia - cont mirtazapine - see below - consider melatonin in future  9. Weight gain - weight increased to 9 lbs in 2 months  - consider stopping mirtazapine in future    Family/ staff Communication: plan discussed with  patient and nurse  Labs/tests ordered:  none

## 2024-10-28 ENCOUNTER — Encounter: Payer: Self-pay | Admitting: Internal Medicine

## 2024-10-28 ENCOUNTER — Non-Acute Institutional Stay (SKILLED_NURSING_FACILITY): Payer: Self-pay | Admitting: Internal Medicine

## 2024-10-28 DIAGNOSIS — R630 Anorexia: Secondary | ICD-10-CM

## 2024-10-28 DIAGNOSIS — I10 Essential (primary) hypertension: Secondary | ICD-10-CM

## 2024-10-28 DIAGNOSIS — E782 Mixed hyperlipidemia: Secondary | ICD-10-CM | POA: Diagnosis not present

## 2024-10-28 DIAGNOSIS — I6930 Unspecified sequelae of cerebral infarction: Secondary | ICD-10-CM

## 2024-10-28 DIAGNOSIS — E039 Hypothyroidism, unspecified: Secondary | ICD-10-CM | POA: Diagnosis not present

## 2024-10-28 NOTE — Assessment & Plan Note (Signed)
 Stable.  Patient is on Crestor  40 mg daily for hyperlipidemia.

## 2024-10-28 NOTE — Progress Notes (Signed)
 [facility] SN[facility] Routine Visit Progress Note    Location:  Other [place].  Nursing Home Room Number: [facility] [id number] Place of Service:  SNF (31) Greig Cluster, NP   PCP: [name], MD   Patient Care Team: [name], MD as PCP - General (Family Medicine) Dessa Reyes ORN, MD (General Surgery)   Extended Emergency Contact Information Primary Emergency Contact: Jasper,Teresa Mobile Phone: [phone] Relation: Niece Secondary Emergency Contact: Jones,[name] Mobile Phone: [phone] Relation: Niece   Goals of care: Advanced Directive information    10/28/2024    1:26 PM  Advanced Directives  Does Patient Have a Medical Advance Directive? Yes  Type of Advance Directive Out of facility DNR (pink MOST or yellow form)  Does patient want to make changes to medical advance directive? No - Patient declined    CODE STATUS: Do Not Resuscitate (DNR)   Chief Complaint  Patient presents with   Medical Management of Chronic Issues    Medical Management of Chronic Issues.      HPI: Pt is a 88 y.o. female seen today for medical management of chronic disease.   88 year old female with a history of hypertension, right knee and hip pain, prior history of right knee arthroplasty, acquired hypothyroidism, overactive bladder, restless leg syndrome, prior history of stroke who is seen for routine medical care.  Patient's only complaint is that she has occasional pain in her right hip and right knee.  She states that it does hurt occasionally in the morning but is currently not bothering her now.  She continues to work with physical therapy.  She has no other complaints.   Past Medical History:  Diagnosis Date   Actinic keratosis    Arthritis    Breast cancer (HCC) [phone]   Cancer [facility])    breast s/p bilateral lumpectomies with radiation,   Cervical spine fracture (HCC) 04/25/2021   Closed fracture of ramus of left pubis (HCC)    Complication of anesthesia     Pt stated her top lip was puffed up and felt strange for weeks after having hip surgery. Pt thinks intubation appliance may have caused it   Constipation due to pain medication    CVA (cerebral vascular accident) (HCC) 06/13/2024   Encounter for insertion of prosthetic knee after prior removal of knee prosthesis 04/28/2021   Environmental and seasonal allergies    Fracture of pubic ramus, left, closed, initial encounter (HCC) 04/25/2021   Fracture of pubis (HCC) 04/28/2021   Frequency of urination    History of breast cancer 06/21/2024   Hx of basal cell carcinoma 08/19/2010   Mid dorsum nose supra tip   Hypertension    pt has not had any blood pressure medications in over 4 years   Malignant neoplasm of upper-outer quadrant of female breast (HCC) 1995   [place]  wide excision, reexcision, whole breast radiation.   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 T1a   Malignant neoplasm of upper-outer quadrant of female breast (HCC) 2002   Right breast   Recurrent falls 07/28/2024   Squamous cell carcinoma of skin 11/23/2007   Right lateral upper eyelid. SCCis   Squamous cell carcinoma of skin 09/03/2019   Right lateral pretibia. EDC.    Squamous cell carcinoma of skin 03/30/2020   R lateral pretibia    Squamous cell carcinoma of skin 03/09/2022   R upper arm, EDC   TIA (  transient ischemic attack) 06/12/2024   Trigger finger of both hands    Past Surgical History:  Procedure Laterality Date   BACK SURGERY  1960s   lumbar ruptured discs   BREAST BIOPSY     BREAST LUMPECTOMY Left 1995 with Radiation   BREAST LUMPECTOMY Right 2002   with Radiation   BREAST LUMPECTOMY Left 2011   BREAST SURGERY Bilateral    lumpectomies x 3   COLONOSCOPY  2006   Dr. Viktoria   EYE SURGERY  1990s   bilat cataract removal   FRACTURE SURGERY     L wrist repair, R lower leg   JOINT  REPLACEMENT     R knee w/prior arhtroscopy   KYPHOPLASTY N/A 10/23/2014   Procedure: KYPHOPLASTY;  Surgeon: Oneil Rodgers Priestly, MD;  Location: [facility] OR;  Service: Orthopedics;  Laterality: N/A;  T 12 kyphoplasty   TOTAL HIP ARTHROPLASTY  07/30/2012   Procedure: TOTAL HIP ARTHROPLASTY;  Surgeon: Dempsey JINNY Sensor, MD;  Location: [facility] OR;  Service: Orthopedics;  Laterality: Left;   uterine tumor excision     benign   WRIST SURGERY Left      Allergies[1]   Outpatient Encounter Medications as of 10/28/2024  Medication Sig   acetaminophen  (TYLENOL ) 325 MG tablet Take 2 tablets (650 mg total) by mouth every 4 (four) hours as needed for mild pain (pain score 1-3) or fever (or temp > 37.5 C (99.5 F)).   acetaminophen  (TYLENOL ) 325 MG tablet Take 650 mg by mouth 3 (three) times daily.   aspirin  EC 81 MG tablet Take 1 tablet (81 mg total) by mouth daily. Swallow whole.   bisacodyl  (DULCOLAX) 10 MG suppository Place 1 suppository (10 mg total) rectally daily as needed for moderate constipation.   Calcium -Magnesium -Vitamin D  (CALCIUM  MAGNESIUM  PO) Take 1 tablet by mouth daily.   carboxymethylcellulose (REFRESH PLUS) 0.5 % SOLN 1 drop daily.   cholecalciferol  (VITAMIN D ) 1000 UNITS tablet Take 1,000 Units by mouth daily.   clopidogrel  (PLAVIX ) 75 MG tablet Take 1 tablet (75 mg total) by mouth daily.   diclofenac Sodium (VOLTAREN) 1 % GEL Apply 4 g topically every 6 (six) hours as needed.   feeding supplement (ENSURE PLUS HIGH PROTEIN) LIQD Take 237 mLs by mouth 2 (two) times daily between meals.   Hexylresorcinol (THROAT LOZENGES MT) Use as directed 1 drop in the mouth or throat.   loratadine  (CLARITIN ) 10 MG tablet Take 10 mg by mouth daily as needed for allergies.   losartan (COZAAR) 25 MG tablet Take 25 mg by mouth daily.   melatonin 5 MG TABS Take 5 mg by mouth at bedtime.   Menthol  (COUGH DROPS) 2.7 MG LOZG Use as directed 1 each in the mouth or throat daily. As needed.   mirtazapine (REMERON) 7.5 MG  tablet Take 7.5 mg by mouth at bedtime.   Polyvinyl Alcohol -Povidone (REFRESH OP) Place 1 drop into both eyes as needed (dry eyes).    rosuvastatin  (CRESTOR ) 40 MG tablet Take 1 tablet (40 mg total) by mouth daily.   sodium chloride  (OCEAN) 0.65 % SOLN nasal spray Place 1 spray into both nostrils as needed for congestion.   Zinc  50 MG TABS Take 50 mg by mouth daily.   No facility-administered encounter medications on file as of 10/28/2024.     Review of Systems  Constitutional: Negative.   HENT: Negative.    Eyes: Negative.   Respiratory: Negative.    Cardiovascular: Negative.   Endocrine: Negative.   Musculoskeletal:  Occasional right hip and knee stiffness in the morning only.  All other systems reviewed and are negative.     Immunization History  Administered Date(s) Administered   PPD Test 05/14/2021   RSV,unspecified 06/26/2024   Tdap 01/24/2012   Pertinent  Health Maintenance Due  Topic Date Due   Bone Density Scan  Never done   Influenza Vaccine  Never done   Mammogram  Discontinued      04/27/2021    8:40 PM 04/28/2021    7:34 AM 05/04/2021   11:08 AM 08/25/2024    6:34 PM 09/18/2024    5:46 PM  Fall Risk  Falls in the past year?    1 1  Was there an injury with Fall?    1  1   Fall Risk Category Calculator    3 3  (RETIRED) Patient Fall Risk Level High fall risk  High fall risk  High fall risk     Patient at Risk for Falls Due to    History of fall(s) History of fall(s);Impaired balance/gait  Fall risk Follow up    Falls evaluation completed Falls evaluation completed     Data saved with a previous flowsheet row definition   Functional Status Survey:     Vitals:   10/28/24 1317  BP: 128/64  Pulse: 81  Resp: 18  Temp: 98 F (36.7 C)  SpO2: 96%  Weight: 174 lb 3.2 oz (79 kg)  Height: 5' 10 (1.778 m)   Body mass index is 25 kg/m. Physical Exam Vitals and nursing note reviewed.  Constitutional:      General: She is not in acute distress.     Appearance: She is normal weight. She is not toxic-appearing or diaphoretic.  HENT:     Head: Normocephalic and atraumatic.     Nose: Nose normal.  Eyes:     General: No scleral icterus. Cardiovascular:     Rate and Rhythm: Normal rate and regular rhythm.  Pulmonary:     Effort: Pulmonary effort is normal.     Breath sounds: Normal breath sounds.  Abdominal:     General: Abdomen is flat. Bowel sounds are normal. There is no distension.  Skin:    General: Skin is warm and dry.     Capillary Refill: Capillary refill takes less than 2 seconds.     Comments: Evidence of well-healed old total knee arthroplasty incision on the right knee.  Neurological:     Mental Status: She is alert and oriented to person, place, and time.      Labs reviewed: Recent Labs    06/12/24 1136 06/14/24 0358 06/18/24 1107 06/24/24 0000 08/12/24 0000  NA 139 140 139 139 138  K 4.0 4.1 3.9 4.6 4.5  CL 104 105 107 104 105  CO2 27 26 24  30* 29*  GLUCOSE 94 134* 130*  --   --   BUN 25* 37* 33* 27* 27*  CREATININE 1.06* 1.22* 1.04* 1.0 1.2*  CALCIUM  9.2 9.4 8.9 8.4* 8.8   Recent Labs    06/12/24 1136  AST 20  ALT 15  ALKPHOS 63  BILITOT 1.0  PROT 7.7  ALBUMIN 3.7   Recent Labs    06/12/24 1136 06/14/24 0358 06/18/24 1107 06/24/24 0000 07/29/24 0000  WBC 9.3 11.3* 10.6* 11.5 9.7  NEUTROABS 5.5  --   --  6,785.00 4,889.00  HGB 11.9* 13.3 12.1 9.0* 9.7*  HCT 37.0 41.6 37.6 27* 30*  MCV 96.6 96.5 95.9  --   --  PLT 265 303 290 330 344    Lab Results  Component Value Date   HGBA1C 5.8 (H) 06/12/2024   Lab Results  Component Value Date   CHOL 208 (H) 06/13/2024   HDL 77 06/13/2024   LDLCALC 109 (H) 06/13/2024   TRIG 109 06/13/2024   CHOLHDL 2.7 06/13/2024     Significant Diagnostic Results in last 30 days: No results found.   Assessment/Plan Essential hypertension, benign Blood pressure currently controlled on losartan 25 mg daily.  Poor appetite Continue Remeron 7.5  mg nightly for appetite stimulation.  Acquired hypothyroidism Patient currently not on thyroid replacement.  Continue to monitor TSH as needed.  Mixed hyperlipidemia Stable.  Patient is on Crestor  40 mg daily for hyperlipidemia.  Late effect of cerebrovascular accident (CVA) Continue with aspirin  81 mg daily.  She is also on Plavix  and 5 mg daily.  She is also on Crestor  40 mg daily.   Camellia Door, DO  [facility] [phone]      [1]  Allergies Allergen Reactions   Ativan  [Lorazepam ]    Demerol [Meperidine] Nausea And Vomiting   Oxycodone     Propoxyphene Nausea And Vomiting   Tape Itching

## 2024-10-28 NOTE — Assessment & Plan Note (Signed)
 Patient currently not on thyroid replacement.  Continue to monitor TSH as needed.

## 2024-10-28 NOTE — Assessment & Plan Note (Signed)
 Continue Remeron 7.5 mg nightly for appetite stimulation.

## 2024-10-28 NOTE — Assessment & Plan Note (Signed)
 Continue with aspirin  81 mg daily.  She is also on Plavix  and 5 mg daily.  She is also on Crestor  40 mg daily.

## 2024-10-28 NOTE — Assessment & Plan Note (Signed)
 Blood pressure currently controlled on losartan 25 mg daily.

## 2024-11-20 ENCOUNTER — Non-Acute Institutional Stay (SKILLED_NURSING_FACILITY): Payer: Self-pay | Admitting: Orthopedic Surgery

## 2024-11-20 ENCOUNTER — Encounter: Payer: Self-pay | Admitting: Orthopedic Surgery

## 2024-11-20 DIAGNOSIS — M8000XD Age-related osteoporosis with current pathological fracture, unspecified site, subsequent encounter for fracture with routine healing: Secondary | ICD-10-CM | POA: Diagnosis not present

## 2024-11-20 DIAGNOSIS — E782 Mixed hyperlipidemia: Secondary | ICD-10-CM | POA: Diagnosis not present

## 2024-11-20 DIAGNOSIS — D649 Anemia, unspecified: Secondary | ICD-10-CM | POA: Diagnosis not present

## 2024-11-20 DIAGNOSIS — G8929 Other chronic pain: Secondary | ICD-10-CM | POA: Diagnosis not present

## 2024-11-20 DIAGNOSIS — Z8673 Personal history of transient ischemic attack (TIA), and cerebral infarction without residual deficits: Secondary | ICD-10-CM | POA: Diagnosis not present

## 2024-11-20 DIAGNOSIS — F5101 Primary insomnia: Secondary | ICD-10-CM | POA: Diagnosis not present

## 2024-11-20 DIAGNOSIS — M545 Low back pain, unspecified: Secondary | ICD-10-CM

## 2024-11-20 DIAGNOSIS — R7303 Prediabetes: Secondary | ICD-10-CM | POA: Diagnosis not present

## 2024-11-20 DIAGNOSIS — I10 Essential (primary) hypertension: Secondary | ICD-10-CM | POA: Diagnosis not present

## 2024-11-20 DIAGNOSIS — R630 Anorexia: Secondary | ICD-10-CM

## 2024-11-20 NOTE — Progress Notes (Signed)
 " Location:  Other [facility]) Nursing Home Room Number: [place] [facility] 320 A Place of Service:  SNF [phone]) Provider:  Gil Greig FORBES CARROLYN Diedra Jerona, MD  Patient Care Team: Diedra Jerona, MD as PCP - General (Family Medicine) Dessa Reyes ORN, MD (General Surgery)  Extended Emergency Contact Information Primary Emergency Contact: Jasper,Teresa Mobile Phone: [phone] Relation: Niece Secondary Emergency Contact: Jones,[name] Mobile Phone: [phone] Relation: Niece  Code Status:  DNR Goals of care: Advanced Directive information    11/20/2024    1:34 PM  Advanced Directives  Does Patient Have a Medical Advance Directive? Yes  Type of Estate Agent of [place];Out of facility DNR (pink MOST or yellow form)  Does patient want to make changes to medical advance directive? No - Patient declined  Copy of Healthcare Power of Attorney in Chart? Yes - validated most recent copy scanned in chart (See row information)     Chief Complaint  Patient presents with   Medical Management of Chronic Issues    HPI:  Pt is a 89 y.o. female seen today for medical management of chronic conditions.   She currently resides on the skilled nursing unit at [facility] 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    Chronic back pain- h/o compression fracture, remains on tylenol > reports increased pain today  HTN- BUN/creat 27/1.2 08/12/2024, remains on losartan HLD- Total 208, LDL 109, remains on rosuvastatin  CVA- hospitalized 08/06-08/14> CT head noted no acute intracranial abnormality> chronic small vessel disease noted, MRI brain acute infarcts noted to right frontal lobe involving corona radiata and periventricular white matter, endarterectomy recommended> family declined,  remains on asa, Plavix  and statin  Prediabetes- A1c 5.8 06/12/2024, not on medication Osteoporosis- no recent DEXA, h/o radiation, remains on calcium  and vitamin D  Anemia- hgb 9.7 07/29/2024, no recent bleeding  Insomnia- remains on mirtazapine   She wanted to have medication reconciliation during encounter. Plan to stop calcium , melatonin and zinc .   BIMS score 13/15 09/2024.   Recent weights:  01/07- 174.4 lsb  12/03- 173.8 lbs  11/05- 170 lbs  Recent blood pressures:  01/14- 134/80  01/13- 128/66, 127/66     Past Medical History:  Diagnosis Date   Actinic keratosis    Arthritis    Breast cancer (HCC) [phone]   Cancer (HCC)    breast s/p bilateral lumpectomies with radiation,   Cervical spine fracture (HCC) 04/25/2021   Closed fracture of ramus of left pubis (HCC)    Complication of anesthesia    Pt stated her top lip was puffed up and felt strange for weeks after having hip surgery. Pt thinks intubation appliance may have caused it   Constipation due to pain medication    CVA (cerebral vascular accident) (HCC) 06/13/2024   Encounter for insertion of prosthetic knee after prior removal of knee prosthesis 04/28/2021   Environmental and seasonal allergies    Fracture of pubic ramus, left, closed, initial encounter (HCC) 04/25/2021   Fracture of pubis (HCC) 04/28/2021   Frequency of urination    History of breast cancer 06/21/2024   Hx of basal cell carcinoma 08/19/2010   Mid dorsum nose supra tip   Hypertension    pt has not had any blood pressure medications in over 4 years   Malignant neoplasm of upper-outer quadrant of female breast (HCC) 1995  [place]  wide excision, reexcision, whole breast radiation.   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 T1a   Malignant neoplasm of upper-outer quadrant of female breast (HCC) 2002   Right breast    Recurrent falls 07/28/2024   Squamous cell carcinoma of skin 11/23/2007   Right lateral upper eyelid. SCCis   Squamous cell carcinoma of skin 09/03/2019   Right lateral pretibia. EDC.    Squamous cell carcinoma of skin 03/30/2020   R lateral pretibia    Squamous cell carcinoma of skin 03/09/2022   R upper arm, EDC   TIA (transient ischemic attack) 06/12/2024   Trigger finger of both hands    Past Surgical History:  Procedure Laterality Date   BACK SURGERY  1960s   lumbar ruptured discs   BREAST BIOPSY     BREAST LUMPECTOMY Left 1995 with Radiation   BREAST LUMPECTOMY Right 2002   with Radiation   BREAST LUMPECTOMY Left 2011   BREAST SURGERY Bilateral    lumpectomies x 3   COLONOSCOPY  2006   Dr. Viktoria   EYE SURGERY  1990s   bilat cataract removal   FRACTURE SURGERY     L wrist repair, R lower leg   JOINT REPLACEMENT     R knee w/prior arhtroscopy   KYPHOPLASTY N/A 10/23/2014   Procedure: KYPHOPLASTY;  Surgeon: Oneil Rodgers Priestly, MD;  Location: [facility] OR;  Service: Orthopedics;  Laterality: N/A;  T 12 kyphoplasty   TOTAL HIP ARTHROPLASTY  07/30/2012   Procedure: TOTAL HIP ARTHROPLASTY;  Surgeon: Dempsey JINNY Sensor, MD;  Location: [facility] OR;  Service: Orthopedics;  Laterality: Left;   uterine tumor excision     benign   WRIST SURGERY Left     Allergies[1]  Outpatient Encounter Medications as of 11/20/2024  Medication Sig   acetaminophen  (TYLENOL ) 325 MG tablet Take 2 tablets (650 mg total) by mouth every 4 (four) hours as needed for mild pain (pain score 1-3) or fever (or temp > 37.5 C (99.5 F)).   acetaminophen  (TYLENOL ) 500 MG tablet Take 1,000 mg by mouth 3 (three) times daily.   aspirin  EC 81 MG tablet Take 1 tablet (81 mg total) by mouth daily. Swallow whole.   bisacodyl  (DULCOLAX) 10 MG suppository Place 1 suppository (10 mg total) rectally daily as needed for moderate constipation.   carboxymethylcellulose (REFRESH PLUS) 0.5 % SOLN 1 drop daily.   cholecalciferol   (VITAMIN D ) 1000 UNITS tablet Take 1,000 Units by mouth daily.   clopidogrel  (PLAVIX ) 75 MG tablet Take 1 tablet (75 mg total) by mouth daily.   diclofenac Sodium (VOLTAREN) 1 % GEL Apply 4 g topically every 6 (six) hours as needed.   feeding supplement (ENSURE PLUS HIGH PROTEIN) LIQD Take 237 mLs by mouth 2 (two) times daily between meals.   lidocaine  4 % Place 1 patch onto the skin 2 (two) times daily. Remove & Discard patch within 12 hours or as directed by MD   loratadine  (CLARITIN ) 10 MG tablet Take 10 mg by mouth daily as needed for allergies.   losartan (COZAAR) 25 MG tablet Take 25 mg by mouth daily.   Menthol  (COUGH DROPS) 2.7 MG LOZG Use as directed 1 each in the mouth or throat daily. As needed.   mirtazapine (REMERON) 7.5 MG tablet Take 7.5 mg by mouth at bedtime.   nystatin powder Apply 1 Application topically 2 (two) times daily.  polyethylene glycol (MIRALAX  / GLYCOLAX ) 17 g packet Take 17 g by mouth as needed.   Polyvinyl Alcohol -Povidone (REFRESH OP) Place 1 drop into both eyes as needed (dry eyes).    rosuvastatin  (CRESTOR ) 40 MG tablet Take 1 tablet (40 mg total) by mouth daily.   sodium chloride  (OCEAN) 0.65 % SOLN nasal spray Place 1 spray into both nostrils as needed for congestion.   acetaminophen  (TYLENOL ) 325 MG tablet Take 650 mg by mouth 3 (three) times daily. (Patient not taking: Reported on 11/20/2024)   Calcium -Magnesium -Vitamin D  (CALCIUM  MAGNESIUM  PO) Take 1 tablet by mouth daily. (Patient not taking: Reported on 11/20/2024)   Hexylresorcinol (THROAT LOZENGES MT) Use as directed 1 drop in the mouth or throat. (Patient not taking: Reported on 11/20/2024)   melatonin 5 MG TABS Take 5 mg by mouth at bedtime. (Patient not taking: Reported on 11/20/2024)   Zinc  50 MG TABS Take 50 mg by mouth daily. (Patient not taking: Reported on 11/20/2024)   No facility-administered encounter medications on file as of 11/20/2024.    Review of Systems  Constitutional:  Negative for  fatigue and fever.  HENT:  Positive for hearing loss. Negative for sore throat and trouble swallowing.   Eyes:  Negative for visual disturbance.  Respiratory:  Negative for cough and shortness of breath.   Cardiovascular:  Negative for chest pain and leg swelling.  Gastrointestinal:  Negative for abdominal distention and abdominal pain.  Genitourinary:  Negative for dysuria and hematuria.  Musculoskeletal:  Positive for arthralgias, back pain and gait problem.  Skin:  Negative for wound.  Neurological:  Negative for dizziness and headaches.  Psychiatric/Behavioral:  Positive for dysphoric mood. Negative for sleep disturbance. The patient is not nervous/anxious.     Immunization History  Administered Date(s) Administered   PPD Test 05/14/2021   RSV,unspecified 06/26/2024   Tdap 01/24/2012   Pertinent  Health Maintenance Due  Topic Date Due   Bone Density Scan  Never done   Influenza Vaccine  Never done   Mammogram  Discontinued      04/27/2021    8:40 PM 04/28/2021    7:34 AM 05/04/2021   11:08 AM 08/25/2024    6:34 PM 09/18/2024    5:46 PM  Fall Risk  Falls in the past year?    1 1  Was there an injury with Fall?    1  1   Fall Risk Category Calculator    3 3  (RETIRED) Patient Fall Risk Level High fall risk  High fall risk  High fall risk     Patient at Risk for Falls Due to    History of fall(s) History of fall(s);Impaired balance/gait  Fall risk Follow up    Falls evaluation completed Falls evaluation completed     Data saved with a previous flowsheet row definition   Functional Status Survey:    Vitals:   11/20/24 1330  BP: 134/80  Pulse: 89  Resp: 19  Temp: 97.8 F (36.6 C)  SpO2: 94%  Weight: 174 lb 6.4 oz (79.1 kg)  Height: 5' 10 (1.778 m)   Body mass index is 25.02 kg/m. Physical Exam Vitals reviewed.  Constitutional:      General: She is not in acute distress. HENT:     Head: Normocephalic.     Right Ear: There is no impacted cerumen.     Left  Ear: There is no impacted cerumen.     Nose: Nose normal.     Mouth/Throat:  Mouth: Mucous membranes are moist.  Eyes:     General:        Right eye: No discharge.        Left eye: No discharge.  Cardiovascular:     Rate and Rhythm: Normal rate and regular rhythm.     Pulses: Normal pulses.     Heart sounds: Normal heart sounds.  Pulmonary:     Effort: Pulmonary effort is normal.     Breath sounds: Normal breath sounds.  Abdominal:     General: Bowel sounds are normal. There is no distension.     Palpations: Abdomen is soft.     Tenderness: There is no abdominal tenderness.  Musculoskeletal:     Cervical back: Neck supple.     Right lower leg: No edema.     Left lower leg: No edema.  Skin:    General: Skin is warm.     Capillary Refill: Capillary refill takes less than 2 seconds.  Neurological:     General: No focal deficit present.     Mental Status: She is alert and oriented to person, place, and time.     Gait: Gait abnormal.  Psychiatric:        Mood and Affect: Mood normal.     Labs reviewed: Recent Labs    06/12/24 1136 06/14/24 0358 06/18/24 1107 06/24/24 0000 08/12/24 0000  NA 139 140 139 139 138  K 4.0 4.1 3.9 4.6 4.5  CL 104 105 107 104 105  CO2 27 26 24  30* 29*  GLUCOSE 94 134* 130*  --   --   BUN 25* 37* 33* 27* 27*  CREATININE 1.06* 1.22* 1.04* 1.0 1.2*  CALCIUM  9.2 9.4 8.9 8.4* 8.8   Recent Labs    06/12/24 1136  AST 20  ALT 15  ALKPHOS 63  BILITOT 1.0  PROT 7.7  ALBUMIN 3.7   Recent Labs    06/12/24 1136 06/14/24 0358 06/18/24 1107 06/24/24 0000 07/29/24 0000  WBC 9.3 11.3* 10.6* 11.5 9.7  NEUTROABS 5.5  --   --  6,785.00 4,889.00  HGB 11.9* 13.3 12.1 9.0* 9.7*  HCT 37.0 41.6 37.6 27* 30*  MCV 96.6 96.5 95.9  --   --   PLT 265 303 290 330 344   No results found for: TSH Lab Results  Component Value Date   HGBA1C 5.8 (H) 06/12/2024   Lab Results  Component Value Date   CHOL 208 (H) 06/13/2024   HDL 77 06/13/2024    LDLCALC 109 (H) 06/13/2024   TRIG 109 06/13/2024   CHOLHDL 2.7 06/13/2024    Significant Diagnostic Results in last 30 days:  No results found.  Assessment/Plan 1. Chronic bilateral low back pain without sciatica (Primary) - ongoing - exam unremarkable  - h/o compression fracture and bilateral hip pain - will increase tylenol  to 500 mg TID - start lidocaine  4% patch daily   2. Essential hypertension, benign - controlled with losartan  3. Mixed hyperlipidemia - cont rosuvastatin    4. History of CVA (cerebrovascular accident) -  hospitalized 08/06-08/14 - cont asa, plavix , statin  5. Prediabetes - diet controlled   6. Age-related osteoporosis with current pathological fracture with routine healing -  no recent DEXA - h/o radiation treatments - requesting to stop calcium  supplement  7. Normocytic anemia - hgb stable - no sign of bleeding   8. Primary insomnia - improved with mirtazapine  - discontinue melatonin  9. Poor appetite - cont mirtazapine  Family/ staff Communication: plan discussed with patient and nurse  Labs/tests ordered:  none     [1]  Allergies Allergen Reactions   Ativan  [Lorazepam ]    Demerol [Meperidine] Nausea And Vomiting   Oxycodone     Propoxyphene Nausea And Vomiting   Tape Itching   "

## 2024-12-10 ENCOUNTER — Encounter: Payer: Self-pay | Admitting: Nurse Practitioner

## 2024-12-10 ENCOUNTER — Non-Acute Institutional Stay (SKILLED_NURSING_FACILITY): Payer: Self-pay | Admitting: Nurse Practitioner

## 2024-12-10 DIAGNOSIS — M5442 Lumbago with sciatica, left side: Secondary | ICD-10-CM | POA: Diagnosis not present

## 2024-12-10 DIAGNOSIS — F5101 Primary insomnia: Secondary | ICD-10-CM

## 2024-12-10 DIAGNOSIS — E782 Mixed hyperlipidemia: Secondary | ICD-10-CM | POA: Diagnosis not present

## 2024-12-10 DIAGNOSIS — I6930 Unspecified sequelae of cerebral infarction: Secondary | ICD-10-CM | POA: Diagnosis not present

## 2024-12-10 DIAGNOSIS — R7303 Prediabetes: Secondary | ICD-10-CM | POA: Diagnosis not present

## 2024-12-10 DIAGNOSIS — I10 Essential (primary) hypertension: Secondary | ICD-10-CM

## 2024-12-10 DIAGNOSIS — G8929 Other chronic pain: Secondary | ICD-10-CM | POA: Diagnosis not present

## 2024-12-10 DIAGNOSIS — M5441 Lumbago with sciatica, right side: Secondary | ICD-10-CM | POA: Diagnosis not present

## 2024-12-10 DIAGNOSIS — R630 Anorexia: Secondary | ICD-10-CM | POA: Diagnosis not present

## 2024-12-10 NOTE — Progress Notes (Unsigned)
 " Location:  Other [facility] Room Number: [place] [id number] Place of Service:  SNF (31) Harlene An, NP  PCP: [name], MD  Patient Care Team: [name], MD as PCP - General (Family Medicine) Dessa Reyes ORN, MD (General Surgery)  Extended Emergency Contact Information Primary Emergency Contact: Jasper,Teresa Mobile Phone: [phone] Relation: Niece Secondary Emergency Contact: Jones,[name] Mobile Phone: [phone] Relation: Niece  Goals of care: Advanced Directive information    11/20/2024    1:34 PM  Advanced Directives  Does Patient Have a Medical Advance Directive? Yes  Type of Estate Agent of [place];Out of facility DNR (pink MOST or yellow form)  Does patient want to make changes to medical advance directive? No - Patient declined  Copy of Healthcare Power of Attorney in Chart? Yes - validated most recent copy scanned in chart (See row information)     Chief Complaint  Patient presents with   Medical Management of Chronic Issues    Medical Management of Chronic Issues. Ongoing Pain    HPI:  Pt is a 89 y.o. female seen today for medical management of chronic disease and ongoing pain Pt with hx of CVA, HNT, HLD, hyperglycemia, osteoporosis, OAB, hx of breast cancer, s/p lumpectomy and radiation. She is now at [facility] for long term care.  Reports chronic pain to low back and knees.  Reports she is not sure if tylenol  is effective-reports reports she constantly reports pain.  Starts pain is radiating down legs at times.  She denies anxiety but reports occasional depression. States she feels like it is due to aging.  Staff also has concern of mood disorder due to low mood.  Reports she is sleeping well at this time. She is on remeron for appetite and sleep- reports she is eating well. Has had positive weight gain.  No   Past Medical History:  Diagnosis Date   Actinic keratosis    Arthritis     Breast cancer (HCC) [phone]   Cancer [facility])    breast s/p bilateral lumpectomies with radiation,   Cervical spine fracture (HCC) 04/25/2021   Closed fracture of ramus of left pubis (HCC)    Complication of anesthesia    Pt stated her top lip was puffed up and felt strange for weeks after having hip surgery. Pt thinks intubation appliance Grizel Vesely have caused it   Constipation due to pain medication    CVA (cerebral vascular accident) (HCC) 06/13/2024   Encounter for insertion of prosthetic knee after prior removal of knee prosthesis 04/28/2021   Environmental and seasonal allergies    Fracture of pubic ramus, left, closed, initial encounter (HCC) 04/25/2021   Fracture of pubis (HCC) 04/28/2021   Frequency of urination    History of breast cancer 06/21/2024   Hx of basal cell carcinoma 08/19/2010   Mid dorsum nose supra tip   Hypertension    pt has not had any blood pressure medications in over 4 years   Malignant neoplasm of upper-outer quadrant of female breast (HCC) 1995   [place]  wide excision, reexcision, whole breast radiation.   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 T1a   Malignant neoplasm of upper-outer quadrant of female breast (HCC) 2002   Right breast   Recurrent falls 07/28/2024   Squamous cell carcinoma of skin 11/23/2007   Right lateral upper eyelid. SCCis   Squamous cell carcinoma of skin  09/03/2019   Right lateral pretibia. EDC.    Squamous cell carcinoma of skin 03/30/2020   R lateral pretibia    Squamous cell carcinoma of skin 03/09/2022   R upper arm, EDC   TIA (transient ischemic attack) 06/12/2024   Trigger finger of both hands    Past Surgical History:  Procedure Laterality Date   BACK SURGERY  1960s   lumbar ruptured discs   BREAST BIOPSY     BREAST LUMPECTOMY Left 1995 with Radiation   BREAST LUMPECTOMY Right 2002   with Radiation    BREAST LUMPECTOMY Left 2011   BREAST SURGERY Bilateral    lumpectomies x 3   COLONOSCOPY  2006   Dr. Viktoria   EYE SURGERY  1990s   bilat cataract removal   FRACTURE SURGERY     L wrist repair, R lower leg   JOINT REPLACEMENT     R knee w/prior arhtroscopy   KYPHOPLASTY N/A 10/23/2014   Procedure: KYPHOPLASTY;  Surgeon: Oneil Rodgers Priestly, MD;  Location: [facility] OR;  Service: Orthopedics;  Laterality: N/A;  T 12 kyphoplasty   TOTAL HIP ARTHROPLASTY  07/30/2012   Procedure: TOTAL HIP ARTHROPLASTY;  Surgeon: Dempsey JINNY Sensor, MD;  Location: [facility] OR;  Service: Orthopedics;  Laterality: Left;   uterine tumor excision     benign   WRIST SURGERY Left     Allergies[1]  Outpatient Encounter Medications as of 12/10/2024  Medication Sig   acetaminophen  (TYLENOL ) 325 MG tablet Take 650 mg by mouth every 4 (four) hours as needed.   acetaminophen  (TYLENOL ) 500 MG tablet Take 500 mg by mouth 3 (three) times daily.   aspirin  EC 81 MG tablet Take 1 tablet (81 mg total) by mouth daily. Swallow whole.   bisacodyl  (DULCOLAX) 10 MG suppository Place 1 suppository (10 mg total) rectally daily as needed for moderate constipation.   cholecalciferol  (VITAMIN D ) 1000 UNITS tablet Take 1,000 Units by mouth daily.   clopidogrel  (PLAVIX ) 75 MG tablet Take 1 tablet (75 mg total) by mouth daily.   diclofenac Sodium (VOLTAREN) 1 % GEL Apply 4 g topically every 6 (six) hours as needed.   feeding supplement (ENSURE PLUS HIGH PROTEIN) LIQD Take 237 mLs by mouth 2 (two) times daily between meals.   lidocaine  4 % Place 1 patch onto the skin 2 (two) times daily. Remove & Discard patch within 12 hours or as directed by MD   loratadine  (CLARITIN ) 10 MG tablet Take 10 mg by mouth daily as needed for allergies.   losartan (COZAAR) 25 MG tablet Take 25 mg by mouth daily.   Menthol  (COUGH DROPS) 2.7 MG LOZG Use as directed 1 each in the mouth or throat daily. As needed.   mirtazapine (REMERON) 7.5 MG tablet Take 7.5 mg by mouth at  bedtime.   nystatin powder Apply 1 Application topically 2 (two) times daily.   polyethylene glycol (MIRALAX  / GLYCOLAX ) 17 g packet Take 17 g by mouth as needed.   rosuvastatin  (CRESTOR ) 40 MG tablet Take 1 tablet (40 mg total) by mouth daily.   sodium chloride  (OCEAN) 0.65 % SOLN nasal spray Place 1 spray into both nostrils as needed for congestion.   carboxymethylcellulose (REFRESH PLUS) 0.5 % SOLN 1 drop daily. (Patient not taking: Reported on 12/10/2024)   Polyvinyl Alcohol -Povidone (REFRESH OP) Place 1 drop into both eyes as needed (dry eyes).  (Patient not taking: Reported on 12/10/2024)   No facility-administered encounter medications on file as of 12/10/2024.    Review  of Systems  Constitutional:  Negative for activity change, appetite change, fatigue and unexpected weight change.  HENT:  Negative for congestion and hearing loss.   Eyes: Negative.   Respiratory:  Negative for cough and shortness of breath.   Cardiovascular:  Negative for chest pain, palpitations and leg swelling.  Gastrointestinal:  Negative for abdominal pain, constipation and diarrhea.  Genitourinary:  Negative for difficulty urinating and dysuria.  Musculoskeletal:  Positive for arthralgias and back pain. Negative for myalgias.  Skin:  Negative for color change and wound.  Neurological:  Negative for dizziness and weakness.  Psychiatric/Behavioral:  Negative for agitation, behavioral problems and confusion.      Immunization History  Administered Date(s) Administered   PPD Test 05/14/2021   RSV,unspecified 06/26/2024   Tdap 01/24/2012   Pertinent  Health Maintenance Due  Topic Date Due   Bone Density Scan  Never done   Influenza Vaccine  Never done   Mammogram  Discontinued      04/28/2021    7:34 AM 05/04/2021   11:08 AM 08/25/2024    6:34 PM 09/18/2024    5:46 PM 11/20/2024    3:24 PM  Fall Risk  Falls in the past year?   1 1 1   Was there an injury with Fall?   1  1  1   Fall Risk Category  Calculator   3 3 2   (RETIRED) Patient Fall Risk Level High fall risk  High fall risk      Patient at Risk for Falls Due to   History of fall(s) History of fall(s);Impaired balance/gait History of fall(s);Impaired balance/gait  Fall risk Follow up   Falls evaluation completed Falls evaluation completed Falls evaluation completed     Data saved with a previous flowsheet row definition   Functional Status Survey:    Vitals:   12/10/24 1604  BP: 137/69  Pulse: 72  Resp: 18  Temp: 98.2 F (36.8 C)  SpO2: 96%  Weight: 174 lb 6.4 oz (79.1 kg)  Height: 5' 10 (1.778 m)   Body mass index is 25.02 kg/m. Physical Exam Constitutional:      General: She is not in acute distress.    Appearance: She is well-developed. She is not diaphoretic.  HENT:     Head: Normocephalic and atraumatic.     Mouth/Throat:     Pharynx: No oropharyngeal exudate.  Eyes:     Conjunctiva/sclera: Conjunctivae normal.     Pupils: Pupils are equal, round, and reactive to light.  Cardiovascular:     Rate and Rhythm: Normal rate and regular rhythm.     Heart sounds: Normal heart sounds.  Pulmonary:     Effort: Pulmonary effort is normal.     Breath sounds: Normal breath sounds.  Abdominal:     General: Bowel sounds are normal.     Palpations: Abdomen is soft.  Musculoskeletal:     Cervical back: Normal range of motion and neck supple.     Right lower leg: No edema.     Left lower leg: No edema.  Skin:    General: Skin is warm and dry.  Neurological:     Mental Status: She is alert.     Motor: Weakness present.     Gait: Gait abnormal.  Psychiatric:        Mood and Affect: Mood normal.     Labs reviewed: Recent Labs    06/12/24 1136 06/14/24 0358 06/18/24 1107 06/24/24 0000 08/12/24 0000  NA 139 140 139 139  138  K 4.0 4.1 3.9 4.6 4.5  CL 104 105 107 104 105  CO2 27 26 24  30* 29*  GLUCOSE 94 134* 130*  --   --   BUN 25* 37* 33* 27* 27*  CREATININE 1.06* 1.22* 1.04* 1.0 1.2*  CALCIUM  9.2  9.4 8.9 8.4* 8.8   Recent Labs    06/12/24 1136  AST 20  ALT 15  ALKPHOS 63  BILITOT 1.0  PROT 7.7  ALBUMIN 3.7   Recent Labs    06/12/24 1136 06/14/24 0358 06/18/24 1107 06/24/24 0000 07/29/24 0000  WBC 9.3 11.3* 10.6* 11.5 9.7  NEUTROABS 5.5  --   --  6,785.00 4,889.00  HGB 11.9* 13.3 12.1 9.0* 9.7*  HCT 37.0 41.6 37.6 27* 30*  MCV 96.6 96.5 95.9  --   --   PLT 265 303 290 330 344   No results found for: TSH Lab Results  Component Value Date   HGBA1C 5.8 (H) 06/12/2024   Lab Results  Component Value Date   CHOL 208 (H) 06/13/2024   HDL 77 06/13/2024   LDLCALC 109 (H) 06/13/2024   TRIG 109 06/13/2024   CHOLHDL 2.7 06/13/2024    Significant Diagnostic Results in last 30 days:  No results found.  Assessment/Plan Prediabetes A1c at goal, continue dietary modifications and monitor a1c  Poor appetite Stable, improvement in weight, Continue Remeron 7.5 mg nightly for appetite stimulation.  Mixed hyperlipidemia Stable.  Patient is on Crestor  40 mg daily for hyperlipidemia.  Essential hypertension, benign Blood pressure currently controlled on losartan 25 mg daily.  Acquired hypothyroidism Patient currently not on thyroid replacement.  Continue to monitor TSH as needed.  Primary insomnia Well controlled on remeron.   Late effect of cerebrovascular accident (CVA) Stable, Continue with aspirin  81 mg daily.  She is also on Plavix  and 5 mg daily.  She is also on Crestor  40 mg daily.  Chronic bilateral low back pain with sciatica Ongoing, reports she does not know if she is getting a lot of benefit from tylenol .  Will continue at this time but add cymbalta 30 mg to help with pain control      Jessica K. Caro BODILY [facility] [phone]       [1]  Allergies Allergen Reactions   Ativan  [Lorazepam ]    Demerol [Meperidine] Nausea And Vomiting   Oxycodone     Propoxyphene Nausea And Vomiting   Tape Itching   "

## 2024-12-11 DIAGNOSIS — F5101 Primary insomnia: Secondary | ICD-10-CM | POA: Insufficient documentation

## 2024-12-11 DIAGNOSIS — M5441 Lumbago with sciatica, right side: Secondary | ICD-10-CM | POA: Insufficient documentation

## 2024-12-11 NOTE — Assessment & Plan Note (Signed)
 Stable.  Patient is on Crestor  40 mg daily for hyperlipidemia.

## 2024-12-11 NOTE — Assessment & Plan Note (Signed)
 Blood pressure currently controlled on losartan 25 mg daily.

## 2024-12-11 NOTE — Assessment & Plan Note (Signed)
 A1c at goal, continue dietary modifications and monitor a1c

## 2024-12-11 NOTE — Assessment & Plan Note (Signed)
 Stable, improvement in weight, Continue Remeron 7.5 mg nightly for appetite stimulation.

## 2024-12-11 NOTE — Assessment & Plan Note (Signed)
 Patient currently not on thyroid replacement.  Continue to monitor TSH as needed.

## 2024-12-11 NOTE — Assessment & Plan Note (Signed)
 Ongoing, reports she does not know if she is getting a lot of benefit from tylenol .  Will continue at this time but add cymbalta 30 mg to help with pain control

## 2024-12-11 NOTE — Assessment & Plan Note (Signed)
Well controlled on remeron.

## 2024-12-11 NOTE — Assessment & Plan Note (Signed)
 Stable, Continue with aspirin  81 mg daily.  She is also on Plavix  and 5 mg daily.  She is also on Crestor  40 mg daily.
